# Patient Record
Sex: Female | Born: 1970 | Race: White | Hispanic: No
Health system: Southern US, Community
[De-identification: ages and names within clinical notes are randomized; demographics above are authoritative.]

## PROBLEM LIST (undated history)

## (undated) ENCOUNTER — Emergency Department (HOSPITAL_COMMUNITY): Payer: Medicaid Other | Source: Home / Self Care

## (undated) DIAGNOSIS — I10 Essential (primary) hypertension: Secondary | ICD-10-CM

## (undated) DIAGNOSIS — Z720 Tobacco use: Secondary | ICD-10-CM

## (undated) DIAGNOSIS — I639 Cerebral infarction, unspecified: Secondary | ICD-10-CM

## (undated) DIAGNOSIS — Z90711 Acquired absence of uterus with remaining cervical stump: Secondary | ICD-10-CM

## (undated) DIAGNOSIS — E785 Hyperlipidemia, unspecified: Secondary | ICD-10-CM

## (undated) DIAGNOSIS — K829 Disease of gallbladder, unspecified: Secondary | ICD-10-CM

## (undated) DIAGNOSIS — Z9851 Tubal ligation status: Secondary | ICD-10-CM

## (undated) HISTORY — PX: CHOLECYSTECTOMY: SHX55

## (undated) HISTORY — PX: ABDOMINAL HYSTERECTOMY: SUR658

---

## 1998-06-10 DIAGNOSIS — Z9851 Tubal ligation status: Secondary | ICD-10-CM

## 1998-06-10 DIAGNOSIS — K829 Disease of gallbladder, unspecified: Secondary | ICD-10-CM

## 1998-06-10 HISTORY — DX: Disease of gallbladder, unspecified: K82.9

## 1998-06-10 HISTORY — DX: Tubal ligation status: Z98.51

## 2002-06-10 DIAGNOSIS — Z90711 Acquired absence of uterus with remaining cervical stump: Secondary | ICD-10-CM

## 2002-06-10 HISTORY — DX: Acquired absence of uterus with remaining cervical stump: Z90.711

## 2014-05-10 DIAGNOSIS — I639 Cerebral infarction, unspecified: Secondary | ICD-10-CM

## 2014-05-10 HISTORY — DX: Cerebral infarction, unspecified: I63.9

## 2015-09-01 DIAGNOSIS — F325 Major depressive disorder, single episode, in full remission: Secondary | ICD-10-CM | POA: Insufficient documentation

## 2015-09-01 DIAGNOSIS — I119 Hypertensive heart disease without heart failure: Secondary | ICD-10-CM | POA: Insufficient documentation

## 2015-09-01 DIAGNOSIS — R1312 Dysphagia, oropharyngeal phase: Secondary | ICD-10-CM | POA: Insufficient documentation

## 2015-09-01 DIAGNOSIS — I69359 Hemiplegia and hemiparesis following cerebral infarction affecting unspecified side: Secondary | ICD-10-CM | POA: Insufficient documentation

## 2015-09-01 DIAGNOSIS — K2950 Unspecified chronic gastritis without bleeding: Secondary | ICD-10-CM | POA: Insufficient documentation

## 2015-09-01 DIAGNOSIS — Z79899 Other long term (current) drug therapy: Secondary | ICD-10-CM | POA: Insufficient documentation

## 2018-01-18 ENCOUNTER — Encounter (HOSPITAL_COMMUNITY): Payer: Self-pay | Admitting: *Deleted

## 2018-01-18 ENCOUNTER — Inpatient Hospital Stay (HOSPITAL_COMMUNITY)
Admission: AD | Admit: 2018-01-18 | Discharge: 2018-01-19 | DRG: 069 | Disposition: A | Discharge status: Home / Self Care | Payer: Medicaid Other | Source: Other Acute Inpatient Hospital | Attending: Internal Medicine | Admitting: Internal Medicine

## 2018-01-18 ENCOUNTER — Other Ambulatory Visit: Payer: Self-pay

## 2018-01-18 DIAGNOSIS — I10 Essential (primary) hypertension: Secondary | ICD-10-CM | POA: Diagnosis present

## 2018-01-18 DIAGNOSIS — F17210 Nicotine dependence, cigarettes, uncomplicated: Secondary | ICD-10-CM | POA: Diagnosis present

## 2018-01-18 DIAGNOSIS — R29702 NIHSS score 2: Secondary | ICD-10-CM | POA: Diagnosis present

## 2018-01-18 DIAGNOSIS — I6789 Other cerebrovascular disease: Secondary | ICD-10-CM | POA: Diagnosis not present

## 2018-01-18 DIAGNOSIS — Z801 Family history of malignant neoplasm of trachea, bronchus and lung: Secondary | ICD-10-CM | POA: Diagnosis not present

## 2018-01-18 DIAGNOSIS — G459 Transient cerebral ischemic attack, unspecified: Secondary | ICD-10-CM | POA: Diagnosis present

## 2018-01-18 DIAGNOSIS — I639 Cerebral infarction, unspecified: Secondary | ICD-10-CM | POA: Diagnosis present

## 2018-01-18 DIAGNOSIS — Z90711 Acquired absence of uterus with remaining cervical stump: Secondary | ICD-10-CM | POA: Diagnosis not present

## 2018-01-18 DIAGNOSIS — Z91013 Allergy to seafood: Secondary | ICD-10-CM

## 2018-01-18 DIAGNOSIS — Z716 Tobacco abuse counseling: Secondary | ICD-10-CM | POA: Diagnosis not present

## 2018-01-18 DIAGNOSIS — I69354 Hemiplegia and hemiparesis following cerebral infarction affecting left non-dominant side: Secondary | ICD-10-CM

## 2018-01-18 DIAGNOSIS — I6523 Occlusion and stenosis of bilateral carotid arteries: Secondary | ICD-10-CM | POA: Diagnosis present

## 2018-01-18 DIAGNOSIS — Z9049 Acquired absence of other specified parts of digestive tract: Secondary | ICD-10-CM | POA: Diagnosis not present

## 2018-01-18 DIAGNOSIS — Z720 Tobacco use: Secondary | ICD-10-CM | POA: Diagnosis not present

## 2018-01-18 DIAGNOSIS — I69392 Facial weakness following cerebral infarction: Secondary | ICD-10-CM | POA: Diagnosis not present

## 2018-01-18 DIAGNOSIS — M21372 Foot drop, left foot: Secondary | ICD-10-CM | POA: Diagnosis present

## 2018-01-18 DIAGNOSIS — Z8249 Family history of ischemic heart disease and other diseases of the circulatory system: Secondary | ICD-10-CM | POA: Diagnosis not present

## 2018-01-18 DIAGNOSIS — I6521 Occlusion and stenosis of right carotid artery: Secondary | ICD-10-CM | POA: Diagnosis not present

## 2018-01-18 DIAGNOSIS — Z91041 Radiographic dye allergy status: Secondary | ICD-10-CM | POA: Diagnosis not present

## 2018-01-18 DIAGNOSIS — R55 Syncope and collapse: Secondary | ICD-10-CM | POA: Diagnosis present

## 2018-01-18 DIAGNOSIS — E785 Hyperlipidemia, unspecified: Secondary | ICD-10-CM | POA: Diagnosis present

## 2018-01-18 DIAGNOSIS — Z885 Allergy status to narcotic agent status: Secondary | ICD-10-CM

## 2018-01-18 DIAGNOSIS — I6529 Occlusion and stenosis of unspecified carotid artery: Secondary | ICD-10-CM | POA: Diagnosis not present

## 2018-01-18 HISTORY — DX: Hyperlipidemia, unspecified: E78.5

## 2018-01-18 HISTORY — DX: Essential (primary) hypertension: I10

## 2018-01-18 HISTORY — DX: Tobacco use: Z72.0

## 2018-01-18 HISTORY — DX: Acquired absence of uterus with remaining cervical stump: Z90.711

## 2018-01-18 HISTORY — DX: Tubal ligation status: Z98.51

## 2018-01-18 HISTORY — DX: Disease of gallbladder, unspecified: K82.9

## 2018-01-18 HISTORY — DX: Cerebral infarction, unspecified: I63.9

## 2018-01-18 LAB — PROTIME-INR
INR: 1.03
Prothrombin Time: 13.4 seconds (ref 11.4–15.2)

## 2018-01-18 LAB — APTT: aPTT: 68 seconds — ABNORMAL HIGH (ref 24–36)

## 2018-01-18 MED ORDER — ONDANSETRON HCL 4 MG/2ML IJ SOLN: 4.0000 mg | Freq: Three times a day (TID) | INTRAMUSCULAR | Status: DC | PRN

## 2018-01-18 MED ORDER — SODIUM CHLORIDE 0.9 % IV SOLN
INTRAVENOUS | Status: DC
  Administered 2018-01-18 – 2018-01-19 (×2): via INTRAVENOUS

## 2018-01-18 MED ORDER — METOPROLOL TARTRATE 50 MG PO TABS
50.0000 mg | ORAL_TABLET | Freq: Two times a day (BID) | ORAL | Status: DC
  Administered 2018-01-18: 50 mg via ORAL
  Filled 2018-01-18: qty 1

## 2018-01-18 MED ORDER — HYDRALAZINE HCL 20 MG/ML IJ SOLN: 5.0000 mg | INTRAMUSCULAR | Status: DC | PRN

## 2018-01-18 MED ORDER — OXYCODONE-ACETAMINOPHEN 5-325 MG PO TABS: 1.0000 | ORAL_TABLET | ORAL | Status: DC | PRN

## 2018-01-18 MED ORDER — POLYETHYLENE GLYCOL 3350 17 G PO PACK: 17.0000 g | PACK | Freq: Every day | ORAL | Status: DC | PRN

## 2018-01-18 MED ORDER — ZOLPIDEM TARTRATE 5 MG PO TABS: 5.0000 mg | ORAL_TABLET | Freq: Every evening | ORAL | Status: DC | PRN

## 2018-01-18 MED ORDER — HEPARIN SODIUM (PORCINE) 5000 UNIT/ML IJ SOLN
5000.0000 [IU] | Freq: Three times a day (TID) | INTRAMUSCULAR | Status: DC
  Administered 2018-01-18: 5000 [IU] via SUBCUTANEOUS
  Filled 2018-01-18: qty 1

## 2018-01-18 MED ORDER — ACETAMINOPHEN 650 MG RE SUPP: 650.0000 mg | RECTAL | Status: DC | PRN

## 2018-01-18 MED ORDER — ASPIRIN EC 81 MG PO TBEC
81.0000 mg | DELAYED_RELEASE_TABLET | Freq: Every day | ORAL | Status: DC
  Administered 2018-01-18 – 2018-01-19 (×2): 81 mg via ORAL
  Filled 2018-01-18 (×2): qty 1

## 2018-01-18 MED ORDER — ACETAMINOPHEN 325 MG PO TABS
650.0000 mg | ORAL_TABLET | ORAL | Status: DC | PRN
  Administered 2018-01-19: 650 mg via ORAL
  Filled 2018-01-18: qty 2

## 2018-01-18 MED ORDER — ACETAMINOPHEN 160 MG/5ML PO SOLN: 650.0000 mg | ORAL | Status: DC | PRN

## 2018-01-18 MED ORDER — ATORVASTATIN CALCIUM 80 MG PO TABS
80.0000 mg | ORAL_TABLET | Freq: Every day | ORAL | Status: DC
  Administered 2018-01-18: 80 mg via ORAL
  Filled 2018-01-18: qty 1

## 2018-01-18 MED ORDER — NICOTINE 21 MG/24HR TD PT24
21.0000 mg | MEDICATED_PATCH | Freq: Every day | TRANSDERMAL | Status: DC
  Filled 2018-01-18 (×2): qty 1

## 2018-01-18 MED ORDER — STROKE: EARLY STAGES OF RECOVERY BOOK
Freq: Once | Status: AC
  Administered 2018-01-18

## 2018-01-18 NOTE — Consult Note (Signed)
[facility] [place]  Patient name: Jill Williams MRN: [id number] DOB: 1971-05-01 Sex: female  REASON FOR CONSULT: Evaluation carotid stenosis  HPI: Jill Williams is a 47 y.o. female, who is transferred from [facility] this evening.  I was called this morning by the advanced practice provider caring for the patient at [facility].  He was told that she had a past stroke and admitted with new neurologic deficit with right side weakness.  CT angiogram revealed critical stenosis in her right internal carotid artery and moderate stenosis in the left with irregularity.  The patient was not possible to have an MRI at [facility] on Sunday.  Due to the concern regarding embolus I suggested starting heparin drip and transfer to [facility] for neurologic evaluation and MRI.  There was a significant delay in her being transferred and she is now arrived this evening.  She does have a complex history.  She had a major stroke in her right brain in December 2015.  Apparently was having extreme hypertension.  Was treated in [facility].  Details of this evaluation are not available.  She is right-handed.  She has continued marked neurologic deficit in her left arm.  She does have weakness in her left leg and walks with a cock-up splint.  She reports that she did not have any work-up of carotid disease at that time.  This does seem unlikely.  She reports having a feeding tube which she did not feel that she needed and was transferred to a nursing facility and eventually home.  She reports that she was smoking 3 packs cigarettes per day at the time of her stroke and is dropped back to 1 pack cigarettes per day and is planning on stopping on her daughter's birthday on September 3 of this year.  I explained to her the critical importance of this with her very premature atherosclerotic disease.  She reports that  her father had both right and left carotid endarterectomy for critical stenosis at the [facility] in the past.  He did well with the surgeries.  The patient reports that on this event she did not have any focal weakness.  She reports that she felt dizzy and lightheaded and sick to her stomach.  She did report a burning sensation temporarily in her right temporal region but otherwise no localizing symptoms.  No vision changes.  She reports that she is returned completely back to her baseline deficit.  Past Medical History:  Diagnosis Date  . Disorder of gallbladder 2000   pt states gallbladder was removed  . Essential hypertension   . HLD (hyperlipidemia)   . S/P partial hysterectomy 2004   "whole right side removed"  . Stroke ([place]) 05/10/2014  . Tobacco abuse   . Tubal ligation status 2000   "tied and bunt"    Family History  Problem Relation Age of Onset  . Hypertension Mother   . Lung cancer Father     SOCIAL HISTORY: Social History   Socioeconomic History  . Marital status: Unknown    Spouse name: Not on file  . Number of children: Not on file  . Years of education: Not on file  . Highest education level: Not on file  Occupational History  . Not on file  Social Needs  . Financial resource strain: Not on file  . Food insecurity:    Worry: Not on file    Inability: Not on  file  . Transportation needs:    Medical: Not on file    Non-medical: Not on file  Tobacco Use  . Smoking status: Current Some Day Smoker    Packs/day: 1.00    Years: 34.00    Pack years: 34.00    Types: Cigarettes  . Smokeless tobacco: Never Used  Substance and Sexual Activity  . Alcohol use: Never    Frequency: Never  . Drug use: Never  . Sexual activity: Not Currently  Lifestyle  . Physical activity:    Days per week: Not on file    Minutes per session: Not on file  . Stress: Not on file  Relationships  . Social connections:    Talks on phone: Not on file    Gets together: Not on  file    Attends religious service: Not on file    Active member of club or organization: Not on file    Attends meetings of clubs or organizations: Not on file    Relationship status: Not on file  . Intimate partner violence:    Fear of current or ex partner: Not on file    Emotionally abused: Not on file    Physically abused: Not on file    Forced sexual activity: Not on file  Other Topics Concern  . Not on file  Social History Narrative  . Not on file    Allergies  Allergen Reactions  . Ivp Dye [Iodinated Diagnostic Agents] Shortness Of Breath and Rash  . Codeine Nausea And Vomiting  . Shrimp [Shellfish Allergy] Nausea And Vomiting  . Morphine And Related Rash    Current Facility-Administered Medications  Medication Dose Route Frequency Provider Last Rate Last Dose  .  stroke: mapping our Ilijah Doucet stages of recovery book   Does not apply Once Ivor Costa, MD      . 0.9 %  sodium chloride infusion   Intravenous Continuous Ivor Costa, MD      . acetaminophen (TYLENOL) tablet 650 mg  650 mg Oral Q4H PRN Ivor Costa, MD       Or  . acetaminophen (TYLENOL) solution 650 mg  650 mg Per Tube Q4H PRN Ivor Costa, MD       Or  . acetaminophen (TYLENOL) suppository 650 mg  650 mg Rectal Q4H PRN Ivor Costa, MD      . aspirin EC tablet 81 mg  81 mg Oral Daily Ivor Costa, MD      . atorvastatin (LIPITOR) tablet 80 mg  80 mg Oral q1800 Ivor Costa, MD      . heparin injection 5,000 Units  5,000 Units Subcutaneous Q8H Ivor Costa, MD      . hydrALAZINE (APRESOLINE) injection 5 mg  5 mg Intravenous Q2H PRN Ivor Costa, MD      . metoprolol tartrate (LOPRESSOR) tablet 50 mg  50 mg Oral BID Ivor Costa, MD      . nicotine (NICODERM CQ - dosed in mg/24 hours) patch 21 mg  21 mg Transdermal Daily Ivor Costa, MD      . ondansetron [facility]) injection 4 mg  4 mg Intravenous Q8H PRN Ivor Costa, MD      . oxyCODONE-acetaminophen (PERCOCET/ROXICET) 5-325 MG per tablet 1 tablet  1 tablet Oral Q4H PRN Ivor Costa,  MD      . polyethylene glycol (MIRALAX / GLYCOLAX) packet 17 g  17 g Oral Daily PRN Ivor Costa, MD      . zolpidem (AMBIEN) tablet 5 mg  5  mg Oral QHS PRN Ivor Costa, MD        REVIEW OF SYSTEMS:  [X]  denotes positive finding, [ ]  denotes negative finding Cardiac  Comments:  Chest pain or chest pressure:    Shortness of breath upon exertion: x   Short of breath when lying flat:    Irregular heart rhythm:        Vascular    Pain in calf, thigh, or hip brought on by ambulation:    Pain in feet at night that wakes you up from your sleep:     Blood clot in your veins:    Leg swelling:         Pulmonary    Oxygen at home:    Productive cough:     Wheezing:         Neurologic    Sudden weakness in arms or legs:  x   Sudden numbness in arms or legs:  x   Sudden onset of difficulty speaking or slurred speech:    Temporary loss of vision in one eye:     Problems with dizziness:  x       Gastrointestinal    Blood in stool:     Vomited blood:         Genitourinary    Burning when urinating:     Blood in urine:        Psychiatric    Major depression:         Hematologic    Bleeding problems:    Problems with blood clotting too easily:        Skin    Rashes or ulcers:        Constitutional    Fever or chills:      PHYSICAL EXAM: Vitals:   01/18/18 2016  BP: (!) 159/116  Pulse: 93  Resp: 18  Temp: 98.2 F (36.8 C)  TempSrc: Oral  SpO2: 98%  Weight: 56.5 kg  Height: 5\' 7"  (1.702 m)    GENERAL: The patient is a well-nourished female, in no acute distress. The vital signs are documented above. CARDIOVASCULAR: Harsh carotid bruits bilaterally.  2+ radial pulses bilaterally.  2+ dorsalis pedis pulses bilaterally. PULMONARY: There is good air exchange  ABDOMEN: Soft and non-tender  MUSCULOSKELETAL: There are no major deformities or cyanosis.  Significant muscle wasting in her left hand NEUROLOGIC: Minimal motor function in her left hand.  Decreased ability to  dorsiflex left foot. SKIN: There are no ulcers or rashes noted. PSYCHIATRIC: The patient has a normal affect.  DATA:  CT angiogram from [facility] reveals critical stenosis in the right carotid artery at the bifurcation.  There is moderate stenosis in the left carotid bifurcation and proximal internal carotid artery with possibly some irregularity  MEDICAL ISSUES: The patient currently states that there were no localizing symptoms to suggest left brain event.  Had been placed on heparin drip due to concern for this.  May be able to stop heparin drip depending on the results of the MRI.  If the left carotid is asymptomatic, would continue observation.  Does have critical stenosis in her right carotid and would benefit from endarterectomy assuming no new findings on her MRI.  We will follow with you.  Neurology has been consulted as well.   Rosetta Posner, MD FACS [facility] Tel [phone] Pager [phone]

## 2018-01-18 NOTE — H&P (Addendum)
History and Physical    Jill Williams ZOX:[id number] DOB: 01-25-1971 DOA: 01/18/2018  Referring MD/NP/PA:   PCP: Maris Berger, MD   Patient coming from:  The patient is coming from home.  At baseline, pt is independent for most of ADL.     Chief Complaint: dizziness and weakness  HPI: Jill Williams is a 47 y.o. female with medical history significant of hypertension, hyperlipidemia, stroke 2015 with residual LUE weakness, LUE foot drop and left facial droop, tobacco abuse, who presents with dizziness and weakness.  Pt initially admitted to [facility] because of dizziness and weakness on 01/17/18. Per documentation from [facility], patient was reportedly to have had right-sided weakness and dizziness, but she denies right-sided weakness to me.  She states that she dizziness and severe whole body generalized weakness at 5:00 am 01/17/18.  Her dizziness has resolved currently.  She still feel tired. She has HA. No vision loss or hearing loss.  Patient denies any chest pain, shortness breath, cough, fever or chills.  No nausea, vomiting, diarrhea, abdominal pain, symptoms of UTI. Pt may also have had syncope episode.  CT of the head was unremarkable for acute neurological abnormalities. CTA showed critical 90% stenosis right proximal ICA with 70% stenosis left proximal stenosis with ulcerated fibrofatty plaque or chronic focal dissection. Pending 2D echo result.   Patient was evaluated by tele-neurologist Dr. Deirdre Evener who recommended vascular surgery consultation. On the morning of 8/11 patient's case (including radiographic findings) were discussed with Dr. Donnetta Hutching. He recommended initiation of IV heparin and transfer to [facility] for additional evaluation and treatment. MRI has also been recommended but this study is not available on Sunday. Case was also discussed with neurologist at [facility] Dr. Leonel Ramsay for consultation.  Pt was found to have negative chest  x-ray, INR 1.0, PTT 23.3, negative troponin, proBNP 69, renal function normal, no fever, lipid panel showed TG 126, TC 176, LDL 116, HDL 35. Pt is admitted to telemetry bed as inpatient.  Review of Systems:   General: no fevers, chills, no body weight gain, has fatigue HEENT: no blurry vision, hearing changes or sore throat Respiratory: no dyspnea, coughing, wheezing CV: no chest pain, no palpitations GI: no nausea, vomiting, abdominal pain, diarrhea, constipation GU: no dysuria, burning on urination, increased urinary frequency, hematuria  Ext: no leg edema Neuro: No vision change or hearing loss. Has residual LUE weakness, LUE foot drop and left facial droop and dizziness. Skin: no rash, no skin tear. MSK: No muscle spasm, no deformity, no limitation of range of movement in spin Heme: No easy bruising.  Travel history: No recent long distant travel.  Allergy:  Allergies  Allergen Reactions  . Ivp Dye [Iodinated Diagnostic Agents] Shortness Of Breath and Rash  . Codeine Nausea And Vomiting  . Shrimp [Shellfish Allergy] Nausea And Vomiting  . Morphine And Related Rash    Past Medical History:  Diagnosis Date  . Disorder of gallbladder 2000   pt states gallbladder was removed  . Essential hypertension   . HLD (hyperlipidemia)   . S/P partial hysterectomy 2004   "whole right side removed"  . Stroke ([place]) 05/10/2014  . Tobacco abuse   . Tubal ligation status 2000   "tied and bunt"    Past Surgical History:  Procedure Laterality Date  . ABDOMINAL HYSTERECTOMY    . CHOLECYSTECTOMY      Social History:  reports that she has been smoking cigarettes. She has a 34.00 pack-year smoking  history. She has never used smokeless tobacco. She reports that she does not drink alcohol or use drugs.  Family History:  Family History  Problem Relation Age of Onset  . Hypertension Mother   . Lung cancer Father      Prior to Admission medications   Not on File    Physical  Exam: Vitals:   01/19/18 0112 01/19/18 0300 01/19/18 0459 01/19/18 0500  BP: 128/75 123/72  125/70  Pulse: (!) 59 (!) 59  (!) 59  Resp: 16 18 17 17   Temp: 98.2 F (36.8 C) 98 F (36.7 C)  98 F (36.7 C)  TempSrc: Oral Oral  Oral  SpO2: 98% 98% 100% 100%  Weight:      Height:       General: Not in acute distress HEENT:       Eyes: PERRL, EOMI, no scleral icterus.       ENT: No discharge from the ears and nose, no pharynx injection, no tonsillar enlargement.        Neck: No JVD, no bruit, no mass felt. Heme: No neck lymph node enlargement. Cardiac: S1/S2, RRR, No murmurs, No gallops or rubs. Respiratory: No rales, wheezing, rhonchi or rubs. GI: Soft, nondistended, nontender, no rebound pain, no organomegaly, BS present. GU: No hematuria Ext: No pitting leg edema bilaterally. 2+DP/PT pulse bilaterally. Musculoskeletal: No joint deformities, No joint redness or warmth, no limitation of ROM in spin. Skin: No rashes.  Neuro: Alert, oriented X3, cranial nerves II-XII grossly intact, residual LUE weakness, LUE foot drop and left facial droop. Psych: Patient is not psychotic, no suicidal or hemocidal ideation.  Labs on Admission: I have personally reviewed following labs and imaging studies  CBC: No results for input(s): WBC, NEUTROABS, HGB, HCT, MCV, PLT in the last 168 hours. Basic Metabolic Panel: No results for input(s): NA, K, CL, CO2, GLUCOSE, BUN, CREATININE, CALCIUM, MG, PHOS in the last 168 hours. GFR: CrCl cannot be calculated (No successful lab value found.). Liver Function Tests: No results for input(s): AST, ALT, ALKPHOS, BILITOT, PROT, ALBUMIN in the last 168 hours. No results for input(s): LIPASE, AMYLASE in the last 168 hours. No results for input(s): AMMONIA in the last 168 hours. Coagulation Profile: Recent Labs  Lab 01/18/18 2229  INR 1.03   Cardiac Enzymes: No results for input(s): CKTOTAL, CKMB, CKMBINDEX, TROPONINI in the last 168 hours. BNP (last 3  results) No results for input(s): PROBNP in the last 8760 hours. HbA1C: No results for input(s): HGBA1C in the last 72 hours. CBG: No results for input(s): GLUCAP in the last 168 hours. Lipid Profile: No results for input(s): CHOL, HDL, LDLCALC, TRIG, CHOLHDL, LDLDIRECT in the last 72 hours. Thyroid Function Tests: No results for input(s): TSH, T4TOTAL, FREET4, T3FREE, THYROIDAB in the last 72 hours. Anemia Panel: No results for input(s): VITAMINB12, FOLATE, FERRITIN, TIBC, IRON, RETICCTPCT in the last 72 hours. Urine analysis: No results found for: COLORURINE, APPEARANCEUR, LABSPEC, PHURINE, GLUCOSEU, HGBUR, BILIRUBINUR, KETONESUR, PROTEINUR, UROBILINOGEN, NITRITE, LEUKOCYTESUR Sepsis Labs: @LABRCNTIP (procalcitonin:4,lacticidven:4) )No results found for this or any previous visit (from the past 240 hour(s)).   Radiological Exams on Admission: Mr Brain Wo Contrast  Result Date: 01/19/2018 CLINICAL DATA:  48 y/o F; transient episode of right-sided weakness. EXAM: MRI HEAD WITHOUT CONTRAST TECHNIQUE: Multiplanar, multiecho pulse sequences of the brain and surrounding structures were obtained without intravenous contrast. COMPARISON:  01/17/2018 CT head. 01/18/2018 CT angiogram of head and neck. FINDINGS: Brain: No acute infarction, hemorrhage, hydrocephalus, extra-axial collection or mass  lesion. Chronic hemosiderin stained infarct involving the right insula, lentiform nucleus, caudate head/body, and corona radiata. Wallerian degeneration of the right cerebral peduncle and hemi pons. Vascular: Normal flow voids. Skull and upper cervical spine: Normal marrow signal. Sinuses/Orbits: Negative. Other: None. IMPRESSION: 1. No acute intracranial abnormality identified. 2. Right basal ganglia chronic infarct with wallerian degeneration of right cerebral peduncle and hemi pons. Electronically Signed   By: Kristine Garbe M.D.   On: 01/19/2018 02:53     EKG done in [facility] hospital:  Independently reviewed.  Sinus rhythm, QTC 435, early R wave progression.  No ischemic change.   Assessment/Plan Principal Problem:   TIA (transient ischemic attack) Active Problems:   Essential hypertension   Stroke (HCC)   HLD (hyperlipidemia)   Tobacco abuse   Carotid artery stenosis, asymptomatic, bilateral   Stroke (cerebrum) (HCC)   Syncope   Principal Problem:   TIA (transient ischemic attack) Active Problems:   Essential hypertension   Stroke (HCC)   HLD (hyperlipidemia)   Tobacco abuse   Carotid artery stenosis, asymptomatic, bilateral   Stroke (cerebrum) (HCC)   Syncope  PossibleTIA, syncope and bilateral carotid stenosis: Dr. Lorraine Lax of neurology and Dr. Donnetta Hutching of [facility] were consulted.  Pt was started with IV heparin in before transferred, which will be d/'ced per both Dr. Lorraine Lax and Dr. Donnetta Hutching. Per Dr. Arrie Eastern note, "right carotid does show a small flap which could be ulcerated plaque/old dissection (less likely), however no mobile thrombus seen-->will d/c IV heparin.  - will admit to tele bed as inpt -check UDS - follow up Dr. Luther Parody recommendations - will follow up Aroor's recommendations as follows:  Recommend # MRI of the brain without contrast-->negative for acute stroke. # CTA head:completed #Transthoracic Echo done in [facility], pending results. # Continue ASA daily, considering adding Plavix given her bilateral carotid stenosis-defer to stroke team tomorrow. #Start or continue Atorvastatin 40 mg/other high intensity statin # BP goal: permissive HTN upto 220/120 mmHg  # HBAIC and Lipid profile-->FLP done with LDL 116 # Telemetry monitoring # Frequent neuro checks # stroke swallow screen  Hypertension: Bp 159/116 -hold Bp meds and allow for permissive hypertension -IV hydralazine as needed for SBP.220  HDL -Lipitor  Tobacco abuse: -Nicotine patch    Inpatient status:  # Patient requires inpatient status due to high intensity of service,  high risk for further deterioration and high frequency of surveillance required.  I certify that at the point of admission it is my clinical judgment that the patient will require inpatient hospital care spanning beyond 2 midnights from the point of admission.  . This patient has multiple chronic comorbidities including hypertension, hyperlipidemia, stroke 2015 with residual LUE weakness, LUE foot drop and left facial droop, tobacco abuse,  Now patient has presenting symptoms include dizziness and weakness. . The worrisome physical exam findings include LUE weakness, LUE foot drop and left facial droop. . The initial radiographic and laboratory data are worrisome because CTA showed critical 90% stenosis right proximal ICA with 70% stenosis left proximal stenosis with ulcerated fibrofatty plaque or chronic focal dissection. Pt may need surgery. . Current medical needs: please see my assessment and plan        DVT ppx: SQ Heparin      Code Status: Full code Family Communication: None at bed side.   Disposition Plan:  Anticipate discharge back to previous home environment Consults called:  Dr. Lorraine Lax of neurology and Dr. Donnetta Hutching of [facility] Admission status: Inpatient/tele    Date  of Service 01/19/2018    Ivor Costa [facility] Pager [phone]  If 7PM-7AM, please contact night-coverage [email or web address] Password [facility] 01/19/2018, 6:27 AM

## 2018-01-19 ENCOUNTER — Inpatient Hospital Stay (HOSPITAL_COMMUNITY): Payer: Medicaid Other

## 2018-01-19 ENCOUNTER — Encounter (HOSPITAL_COMMUNITY): Payer: Self-pay | Admitting: Radiology

## 2018-01-19 DIAGNOSIS — I6789 Other cerebrovascular disease: Secondary | ICD-10-CM

## 2018-01-19 DIAGNOSIS — F17210 Nicotine dependence, cigarettes, uncomplicated: Secondary | ICD-10-CM

## 2018-01-19 DIAGNOSIS — G459 Transient cerebral ischemic attack, unspecified: Principal | ICD-10-CM

## 2018-01-19 DIAGNOSIS — R55 Syncope and collapse: Secondary | ICD-10-CM

## 2018-01-19 LAB — RAPID URINE DRUG SCREEN, HOSP PERFORMED
AMPHETAMINES: NOT DETECTED
BARBITURATES: NOT DETECTED
Benzodiazepines: NOT DETECTED
COCAINE: NOT DETECTED
Opiates: NOT DETECTED
TETRAHYDROCANNABINOL: NOT DETECTED

## 2018-01-19 LAB — LIPID PANEL
CHOL/HDL RATIO: 4.4 ratio
Cholesterol: 177 mg/dL (ref 0–200)
HDL: 40 mg/dL — AB (ref >40)
LDL CALC: 114 mg/dL — AB (ref 0–99)
Triglycerides: 116 mg/dL (ref <150)
VLDL: 23 mg/dL (ref 0–40)

## 2018-01-19 LAB — ECHOCARDIOGRAM COMPLETE
Height: 67 in
Weight: 1992.96 oz

## 2018-01-19 LAB — TYPE AND SCREEN
ABO/RH(D): A POS
ANTIBODY SCREEN: NEGATIVE

## 2018-01-19 LAB — HIV ANTIBODY (ROUTINE TESTING W REFLEX): HIV Screen 4th Generation wRfx: NONREACTIVE

## 2018-01-19 MED ORDER — ATORVASTATIN CALCIUM 80 MG PO TABS: 80.0000 mg | ORAL_TABLET | Freq: Every day | ORAL | 0 refills | Status: AC

## 2018-01-19 MED ORDER — ENOXAPARIN SODIUM 40 MG/0.4ML ~~LOC~~ SOLN: 40.0000 mg | SUBCUTANEOUS | Status: DC

## 2018-01-19 MED ORDER — ASPIRIN 81 MG PO TBEC: 81.0000 mg | DELAYED_RELEASE_TABLET | Freq: Every day | ORAL | 0 refills | Status: DC

## 2018-01-19 MED ORDER — HEPARIN SODIUM (PORCINE) 5000 UNIT/ML IJ SOLN
5000.0000 [IU] | Freq: Three times a day (TID) | INTRAMUSCULAR | Status: DC
  Administered 2018-01-19 (×2): 5000 [IU] via SUBCUTANEOUS
  Filled 2018-01-19 (×2): qty 1

## 2018-01-19 NOTE — Evaluation (Signed)
Occupational Therapy Evaluation Patient Details Name: Jill Williams MRN: [id number] DOB: 09/04/1970 Today's Date: 01/19/2018    History of Present Illness Jill Williams is an 47 y.o. 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   Clinical Impression   Pt initially agreeable to OT intervention by answering questions and performing bed mobility. However, pt quickly becoming belligerent to therapist, cursing at therapist, and yelling, " You are just trying to keep me here."OT attempting to provide continued education regarding OT purpose, POC, and goals with pt then refusing to continue. Pt refusing all further services and stating she declines therapy services at this time. Pt reports being at her baseline and having 24/7 supervision/assist at home and with pt declining further services OT signing off. Re-consult if needed in the future. Thank you for referral.    Follow Up Recommendations  No OT follow up;Supervision/Assistance - 24 hour    Equipment Recommendations  None recommended by OT    Recommendations for Other Services Other (comment)(none at this time)     Precautions / Restrictions Precautions Precautions: Fall      Mobility Bed Mobility Overal bed mobility: Needs Assistance    General bed mobility comments: supervision for pt to come to EOB from flat bed  Transfers    General transfer comment: refused    Balance Overall balance assessment: Needs assistance Sitting-balance support: Feet supported;No upper extremity supported Sitting balance-Leahy Scale: Good     ADL either performed or assessed with clinical judgement   ADL    General ADL Comments: pt refused to engage in any tasks and reports she is at baseline. Pt  refusing all OT intervention.     Vision Baseline Vision/History: Wears glasses Wears Glasses: Reading only Patient Visual Report: No change from baseline              Pertinent Vitals/Pain Pain Assessment: No/denies pain     Hand Dominance Right   Extremity/Trunk Assessment Upper Extremity Assessment Upper Extremity Assessment: LUE deficits/detail LUE Deficits / Details: Pt refused to allow therapist to assess. residual weakness from prior CVA   Lower Extremity Assessment Lower Extremity Assessment: Defer to PT evaluation       Communication Communication Communication: No difficulties   Cognition Arousal/Alertness: Awake/alert Behavior During Therapy: Agitated Overall Cognitive Status: Within Functional Limits for tasks assessed                   Home Living Family/patient expects to be discharged to:: Private residence Living Arrangements: Non-relatives/Friends Available Help at Discharge: Friend(s);Available 24 hours/day Type of Home: House Home Access: Stairs to enter [employer] of Steps: 2 Entrance Stairs-Rails: Can reach both;Right;Left Home Layout: One level     Bathroom Shower/Tub: Teacher, early years/pre: Standard     Home Equipment: Environmental consultant - standard   Additional Comments: has a RW but reports not using in several years      Prior Functioning/Environment Level of Independence: Independent with assistive device(s)  Gait / Transfers Assistance Needed: mod I per pt report ADL's / Homemaking Assistance Needed: mod per pt report                     OT Goals(Current goals can be found in the care plan section) Acute Rehab  OT Goals Patient Stated Goal: none stated  OT Frequency:                AM-PAC PT "6 Clicks" Daily Activity     Outcome Measure Help from another person eating meals?: A Little Help from another person taking care of personal grooming?: A Little Help from another person toileting, which  includes using toliet, bedpan, or urinal?: A Little Help from another person bathing (including washing, rinsing, drying)?: A Little Help from another person to put on and taking off regular upper body clothing?: A Little Help from another person to put on and taking off regular lower body clothing?: A Little 6 Click Score: 18   End of Session Nurse Communication: Mobility status;Other (comment)(pt refusal)  Activity Tolerance: Other (comment)(Pt self limiting/refusal) Patient left: in bed;with call bell/phone within reach;with bed alarm set  OT Visit Diagnosis: Muscle weakness (generalized) (M62.81)                Time: 8295-6213 OT Time Calculation (min): 13 min Charges:  OT General Charges $OT Visit: 1 Visit OT Evaluation $OT Eval Low Complexity: 1 Low   Melane Windholz P , MS, OTR/L 01/19/2018, 8:51 AM

## 2018-01-19 NOTE — Discharge Summary (Signed)
Discharge Summary  Jill Williams JHE:[id number] DOB: May 16, 1971  PCP: Maris Berger, MD  Admit date: 01/18/2018 Discharge date: 01/19/2018  Time spent: 20mins  Recommendations for Outpatient Follow-up:  1. F/u with PMD within a week  for hospital discharge follow up, repeat cbc/bmp at follow up 2. F/u with neurology in 4 weeks 3. F/u with vascular surgery Dr Early  Discharge Diagnoses:  Active Hospital Problems   Diagnosis Date Noted  . TIA (transient ischemic attack) 01/19/2018  . Syncope 01/19/2018  . Carotid artery stenosis, asymptomatic, bilateral 01/18/2018  . Stroke (cerebrum) ([place]) 01/18/2018  . Essential hypertension   . HLD (hyperlipidemia)   . Tobacco abuse   . Stroke [facility]) 05/10/2014    Resolved Hospital Problems  No resolved problems to display.    Discharge Condition: stable  Diet recommendation: heart healthy  Filed Weights   01/18/18 2016  Weight: 56.5 kg    History of present illness: (per admitting MD DR Blaine Hamper) PCP: Maris Berger, MD   Patient coming from:  The patient is coming from home.  At baseline, pt is independent for most of ADL.     Chief Complaint: dizziness and weakness  HPI: Jill Williams is a 47 y.o. female with medical history significant of hypertension, hyperlipidemia, stroke 2015 with residual LUE weakness, LUE foot drop and left facial droop, tobacco abuse, who presents with dizziness and weakness.  Pt initially admitted to [facility] because of dizziness and weakness on 01/17/18. Per documentation from [facility], patient was reportedly to have had right-sided weakness and dizziness, but she denies right-sided weakness to me.  She states that she dizziness and severe whole body generalized weakness at 5:00 am 01/17/18.  Her dizziness has resolved currently.  She still feel tired. She has HA. No vision loss or hearing loss.  Patient denies any chest pain, shortness breath, cough, fever or  chills.  No nausea, vomiting, diarrhea, abdominal pain, symptoms of UTI. Pt may also have had syncope episode.  CT of the head was unremarkable for acute neurological abnormalities. CTA to with critical 90% stenosis right proximal ICA with 70% stenosis left proximal stenosis with ulcerated fibrofatty plaque or chronic focal dissection. Pending 2D echo result.   Patient was evaluated by tele-neurologist Dr. Deirdre Evener who recommended vascular surgery consultation. On the morning of 8/11 patient's case (including radiographic findings) were discussed with Dr. Donnetta Hutching. He recommended initiation of IV heparin and transfer to [facility] for additional evaluation and treatment. MRI has also been recommended but this study is not available on Sunday. Case was also discussed with neurologist at [facility] Dr. Leonel Ramsay for consultation.  Pt was found to have negative chest x-ray, INR 1.0, PTT 23.3, negative troponin, proBNP 69, renal function normal, no fever, lipid panel showed TG 126, TC 176, LDL 116, HDL 35. Pt is admitted to telemetry bed as inpatient.  Hospital Course:  Principal Problem:   TIA (transient ischemic attack) Active Problems:   Essential hypertension   Stroke (HCC)   HLD (hyperlipidemia)   Tobacco abuse   Carotid artery stenosis, asymptomatic, bilateral   Stroke (cerebrum) (HCC)   Syncope    Syncope/TIA -CT head, MRI head no acute findings -a1c pending -echo no embolic source, lvef unremarkable -uds pending, she denies illicit drug or alcohol use -Orthostatic vital sign unremarkable Presenting symptom has resolved -Neurology consulted, input appreciated, increase statin from 40mg  to 80mg  daily, continue asa F/u with pcp and neurology  Bilateral carotid stenosis  -Right ICA  90% stenosis, moderate to severe left carotid stenosis -Vascular surgery Dr Donnetta Hutching "Recommend elective right carotid endarterectomy and continued discussion regarding asymptomatic moderate to severe left carotid  stenosis." -avoid hypotension or dehydration -she is to follow up with Dr Early  History of right BG ICH versus hemorrhagic infarct in 2015 Resultant chronic left upper > lower extremity weakness    Hypertension: Bp 159/116 -hold Bp meds and allow for permissive hypertension -resume home bp meds toprol/lisinopril   HDL ldl 114 -Lipitor increase from 40mg  daily to 80mg  daily  Tobacco abuse: -smoking cessation education provided at length > 32mins, she states she is going to quit. She reports she used to smoke 3-4 pack a day, she decreased it down to one pack a day since stroke in 2015, she is now ready to stop smoking completele   Procedures:  none  Consultations:  Neurology dr Rosalin Hawking  Vascular surgery Dr Early   Discharge Exam: BP 118/68 (BP Location: Left Arm)   Pulse (!) 56   Temp 98 F (36.7 C) (Oral)   Resp 16   Ht 5\' 7"  (1.702 m)   Wt 56.5 kg   SpO2 99%   BMI 19.51 kg/m   General: NAD Cardiovascular: RRR Respiratory: CTABL Neuro: chronic left upper > lower extremity weakness, wearing leg brace to left lower extremity   Discharge Instructions You were cared for by a hospitalist during your hospital stay. If you have any questions about your discharge medications or the care you received while you were in the hospital after you are discharged, you can call the unit and asked to speak with the hospitalist on call if the hospitalist that took care of you is not available. Once you are discharged, your primary care physician will handle any further medical issues. Please note that NO REFILLS for any discharge medications will be authorized once you are discharged, as it is imperative that you return to your primary care physician (or establish a relationship with a primary care physician if you do not have one) for your aftercare needs so that they can reassess your need for medications and monitor your lab values.  Discharge Instructions    Ambulatory  referral to Neurology   Complete by:  As directed    An appointment is requested in approximately: 4 weeks   Diet - low sodium heart healthy   Complete by:  As directed    Increase activity slowly   Complete by:  As directed      Allergies as of 01/19/2018      Reactions   Ivp Dye [iodinated Diagnostic Agents] Shortness Of Breath, Rash   Codeine Nausea And Vomiting   Shrimp [shellfish Allergy] Nausea And Vomiting   Influenza Vaccine Live Rash   Morphine And Related Rash   Pneumococcal Vaccines Rash      Medication List    TAKE these medications   aspirin 81 MG EC tablet Take 1 tablet (81 mg total) by mouth daily. Start taking on:  01/20/2018   atorvastatin 80 MG tablet Commonly known as:  LIPITOR Take 1 tablet (80 mg total) by mouth daily at 6 PM. What changed:    medication strength  how much to take  when to take this   famotidine 40 MG tablet Commonly known as:  PEPCID Take 40 mg by mouth daily.   lisinopril 5 MG tablet Commonly known as:  PRINIVIL,ZESTRIL Take 5 mg by mouth daily.   metoprolol succinate 50 MG 24 hr tablet Commonly  known as:  TOPROL-XL Take 25 mg by mouth daily.      Allergies  Allergen Reactions  . Ivp Dye [Iodinated Diagnostic Agents] Shortness Of Breath and Rash  . Codeine Nausea And Vomiting  . Shrimp [Shellfish Allergy] Nausea And Vomiting  . Influenza Vaccine Live Rash  . Morphine And Related Rash  . Pneumococcal Vaccines Rash   Follow-up Information    Early, Arvilla Meres, MD Follow up.   Specialties:  Vascular Surgery, Cardiology Contact information: [address] [place] [address] [phone]        Maris Berger, MD Follow up in 1 week(s).   Specialty:  Family Medicine Why:  hospital discharge follow up please consider stop smoking,  Contact information: [address] [place] [address] [phone]        [facility] Follow up in 4 week(s).   Contact information: [address]     [place] [address] [phone]           The results of significant diagnostics from this hospitalization (including imaging, microbiology, ancillary and laboratory) are listed below for reference.    Significant Diagnostic Studies: Mr Brain 1 Contrast  Result Date: 01/19/2018 CLINICAL DATA:  47 y/o F; transient episode of right-sided weakness. EXAM: MRI HEAD WITHOUT CONTRAST TECHNIQUE: Multiplanar, multiecho pulse sequences of the brain and surrounding structures were obtained without intravenous contrast. COMPARISON:  01/17/2018 CT head. 01/18/2018 CT angiogram of head and neck. FINDINGS: Brain: No acute infarction, hemorrhage, hydrocephalus, extra-axial collection or mass lesion. Chronic hemosiderin stained infarct involving the right insula, lentiform nucleus, caudate head/body, and corona radiata. Wallerian degeneration of the right cerebral peduncle and hemi pons. Vascular: Normal flow voids. Skull and upper cervical spine: Normal marrow signal. Sinuses/Orbits: Negative. Other: None. IMPRESSION: 1. No acute intracranial abnormality identified. 2. Right basal ganglia chronic infarct with wallerian degeneration of right cerebral peduncle and hemi pons. Electronically Signed   By: Kristine Garbe M.D.   On: 01/19/2018 02:53    Microbiology: No results found for this or any previous visit (from the past 240 hour(s)).   Labs: Basic Metabolic Panel: No results for input(s): NA, K, CL, CO2, GLUCOSE, BUN, CREATININE, CALCIUM, MG, PHOS in the last 168 hours. Liver Function Tests: No results for input(s): AST, ALT, ALKPHOS, BILITOT, PROT, ALBUMIN in the last 168 hours. No results for input(s): LIPASE, AMYLASE in the last 168 hours. No results for input(s): AMMONIA in the last 168 hours. CBC: No results for input(s): WBC, NEUTROABS, HGB, HCT, MCV, PLT in the last 168 hours. Cardiac Enzymes: No results for input(s): CKTOTAL, CKMB,  CKMBINDEX, TROPONINI in the last 168 hours. BNP: BNP (last 3 results) No results for input(s): BNP in the last 8760 hours.  ProBNP (last 3 results) No results for input(s): PROBNP in the last 8760 hours.  CBG: No results for input(s): GLUCAP in the last 168 hours.     Signed:  Florencia Reasons MD, PhD  [facility] 01/19/2018, 4:46 PM

## 2018-01-19 NOTE — Progress Notes (Signed)
  Echocardiogram 2D Echocardiogram has been performed.  Jill Williams 01/19/2018, 12:14 PM

## 2018-01-19 NOTE — Care Management Note (Signed)
Case Management Note  Patient Details  Name: Jill Williams MRN: [id number] Date of Birth: 1970-10-01  Subjective/Objective:    Pt in with TIA. She is from home with relatives.  PCP: Dr Selena Batten      Insurance: Medicaid Pt not on medications prior to admission.           Action/Plan: Awaiting PT eval. No f/u per OT. CM following for d/c needs, physician orders.  Expected Discharge Date:                  Expected Discharge Plan:  Home/Self Care  In-House Referral:     Discharge planning Services     Post Acute Care Choice:    Choice offered to:     DME Arranged:    DME Agency:     HH Arranged:    HH Agency:     Status of Service:  In process, will continue to follow  If discussed at Long Length of Stay Meetings, dates discussed:    Additional Comments:  Pollie Friar, RN 01/19/2018, 10:43 AM

## 2018-01-19 NOTE — Consult Note (Signed)
Requesting Physician: Dr.NIU    Chief Complaint: Generalized weakness, sharp pain in the right side of head and syncope  History obtained from: Patient and Chart     HPI:                                                                                                                                       Jill Williams is an 47 y.o. 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  Patient states that the entire day she was feeling weak and lightheaded.  She was also feeling like she was having a headache.  Later in the day she noticed a sharp pain on the right side of her head.  She tried to get up and passed out.  Her meds took her to the hospital.  At outside hospital they obtain a CTA head and neck which showed 90% stenosis of the right carotid as well as 70% stenosis in the left carotid with ulcerated fibrofatty/focal dissection.  Vascular surgery was called and apparently was told she had right-sided symptoms and on CTA there was a concern for embolus and so patient was started on heparin drip.  She was transferred to [facility] to get an MRI and further management.   Date last known well: 8.10.19 tPA Given: no, outside window NIHSS: 2 Baseline MRS 2   Past Medical History:  Diagnosis Date  . Disorder of gallbladder 2000   pt states gallbladder was removed  . Essential hypertension   . HLD (hyperlipidemia)   . S/P partial hysterectomy 2004   "whole right side removed"  . Stroke ([place]) 05/10/2014  . Tobacco abuse   . Tubal ligation status 2000   "tied and bunt"    Past Surgical History:  Procedure Laterality Date  . ABDOMINAL HYSTERECTOMY    . CHOLECYSTECTOMY      Family History  Problem Relation Age of Onset  . Hypertension Mother    . Lung cancer Father    Social History:  reports that she has been smoking cigarettes. She has a 34.00 pack-year smoking history. She has never used smokeless tobacco. She reports that she does not drink alcohol or use drugs.  Allergies:  Allergies  Allergen Reactions  . Ivp Dye [Iodinated Diagnostic Agents] Shortness Of Breath and Rash  . Codeine Nausea And Vomiting  . Shrimp [Shellfish Allergy] Nausea And Vomiting  . Morphine And Related Rash    Medications:  I reviewed home medications. ON ASA   ROS:                                                                                                                                     14 systems reviewed and negative except above    Examination:                                                                                                      General: Appears well-developed and well-nourished.  Psych: Affect appropriate to situation Eyes: No scleral injection HENT: No OP obstrucion Head: Normocephalic.  Cardiovascular: Normal rate and regular rhythm.  Respiratory: Effort normal and breath sounds normal to anterior ascultation GI: Soft.  No distension. There is no tenderness.  Skin: WDI    Neurological Examinatio Mental Status: Alert, oriented, thought content appropriate.  Speech fluent without evidence of aphasia. Able to follow 3 step commands without difficulty. Cranial Nerves: II: Visual fields grossly normal,  III,IV, VI: ptosis not present, extra-ocular motions intact bilaterally, pupils equal, round, reactive to light and accommodation V,VII: smile symmetric, facial light touch sensation normal bilaterally VIII: hearing normal bilaterally IX,X: uvula rises symmetrically XI: bilateral shoulder shrug XII: midline tongue extension Motor: Right : Upper extremity   5/5    Left:     Upper extremity    3/5  Lower extremity   5/5     Lower extremity   4/5 Tone and bulk: Left upper extremity spasticity  Sensory: Pinprick and light touch intact throughout, bilaterally Deep Tendon Reflexes: 3+ reflexes in left biceps and patella, Plantars: Right: downgoing   Left: downgoing Cerebellar: normal finger-to-nose on right side.  Gait: did not assess      Lab Results: Basic Metabolic Panel: No results for input(s): NA, K, CL, CO2, GLUCOSE, BUN, CREATININE, CALCIUM, MG, PHOS in the last 168 hours.  CBC: No results for input(s): WBC, NEUTROABS, HGB, HCT, MCV, PLT in the last 168 hours.  Coagulation Studies: Recent Labs    01/18/18 Sep 19, 2227  LABPROT 13.4  INR 1.03    Imaging: No results found.   ASSESSMENT AND PLAN  Jill Williams is an 47 y.o. 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  Was described as right-sided weakness.  Initially thought to be TIA due to symptomatic carotid stenosis and transferred to [facility] for stat MRI brain and Vascular surgery consult.  I reviewed the images and right carotid does show a small flap which could be ulcerated plaque/old dissection (less likely) -  however no mobile thrombus seen.  In the absence of localizing weakness or that she is not having any fluctuating symptoms I do not think we need to continue heparin drip thats started at [facility].  Dr. Donnetta Hutching from vascular surgery also felt the same and recommended to follow-up MRI brain.  MRI brain shows no acute stroke and therefore we will not order heparin drip.  Will complete rest of the stroke work-up  Impression: Syncope vs Migraine vs Transient ischemic attack   Recommend # MRI of the  brain without contrast:completed # CTA head:completed #Transthoracic Echo  # Continue ASA daily, considering adding Plavix given her bilateral carotid stenosis-defer to stroke team tomorrow. #Start or continue Atorvastatin 40 mg/other high intensity statin # BP goal: permissive HTN upto 220/120 mmHg  # HBAIC and Lipid profile # Telemetry monitoring # Frequent neuro checks # stroke swallow screen  Please page stroke NP  Or  PA  Or MD from 8am -4 pm  as this patient from this time will be  followed by the stroke.   You can look them up on [email or web address]  Password [facility]    Sushanth Aroor [facility] Pager Number [phone]

## 2018-01-19 NOTE — Progress Notes (Signed)
Nurse went over discharge with patient. Patient verbalized understanding of discharge. All questions and concerns addressed. Discharging home with all belongings taking down in a wheelchair.

## 2018-01-19 NOTE — Progress Notes (Signed)
STROKE TEAM PROGRESS NOTE   SUBJECTIVE (INTERVAL HISTORY) No family is at the bedside.  Patient stated that she did not have good sleep last night, and she felt headache this morning but mild.  She had recounted HPI with me.  She said on 01/17/18, she woke up did not feel well, generalized weakness and feeling sick to her stomach.  Over the day, sick feeling of stomach resolved but still had generalized weakness.  She did not go anywhere she stayed at home watching TV.  At 8 PM, she bent down to pick her puppy up, on standing up, she felt lightheadedness and passed out on [place] backwards.  She woke up briefly after, however felt heat flash feeling on the right head which resembles her previous stroke in 2015.  EMS called, she walked to the ambulance with a driver at that time.  In the ED, CT head neck showed 90% right ICA stenosis, she was put on heparin drip.  She was evaluated by Dr. Donnetta Hutching and considered asymptomatic right ICA stenosis, will do outpatient follow-up.  Initial report showing 70% left ICA stenosis, however I read with Dr. Nevada Crane in radiology this a.m. and did not appear left ICA high-grade stenosis.    OBJECTIVE Vitals:   01/19/18 0459 01/19/18 0500 01/19/18 0654 01/19/18 0900  BP:  125/70 101/63 117/77  Pulse:  (!) 59 61 65  Resp: 17 17 18 16   Temp:  98 F (36.7 C) 98 F (36.7 C) (!) 97.5 F (36.4 C)  TempSrc:  Oral Oral Oral  SpO2: 100% 100% 100% 100%  Weight:      Height:        CBC: No results for input(s): WBC, NEUTROABS, HGB, HCT, MCV, PLT in the last 168 hours.  Basic Metabolic Panel: No results for input(s): NA, K, CL, CO2, GLUCOSE, BUN, CREATININE, CALCIUM, MG, PHOS in the last 168 hours.  Lipid Panel:     Component Value Date/Time   CHOL 177 01/19/2018 0722   TRIG 116 01/19/2018 0722   HDL 40 (L) 01/19/2018 0722   CHOLHDL 4.4 01/19/2018 0722   VLDL 23 01/19/2018 0722   LDLCALC 114 (H) 01/19/2018 0722   HgbA1c: No results found for: HGBA1C Urine Drug  Screen: No results found for: LABOPIA, COCAINSCRNUR, LABBENZ, AMPHETMU, THCU, LABBARB  Alcohol Level No results found for: [place] I have personally reviewed the radiological images below and agree with the radiology interpretations.  CTA head and neck Right ICA 90% stenosis, string sign.  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 Nevada Crane.  Diffuse atherosclerosis intracranially, but no significant stenosis.  Mr Brain Wo Contrast  01/19/2018 IMPRESSION:  1. No acute intracranial abnormality identified. 2. Right basal ganglia chronic infarct with wallerian degeneration of right cerebral peduncle and hemi pons.  Read with Dr. Nevada Crane this morning, it appears to be chronic right basal ganglia ICH versus hemorrhagic infarct  Transthoracic Echocardiogram - pending   PHYSICAL EXAM  Temp:  [97.5 F (36.4 C)-98.2 F (36.8 C)] 97.5 F (36.4 C) (08/12 0900) Pulse Rate:  [59-93] 65 (08/12 0900) Resp:  [16-18] 16 (08/12 0900) BP: (101-159)/(41-116) 117/77 (08/12 0900) SpO2:  [97 %-100 %] 100 % (08/12 0900) Weight:  [56.5 kg] 56.5 kg (08/11 2016)  General - Well nourished, well developed, in no apparent distress.  Ophthalmologic - fundi not visualized due to noncooperation.  Cardiovascular - Regular rate and rhythm.  Mental Status -  Level of arousal and orientation to time, place, and person were intact. Language including expression, naming, repetition, comprehension was assessed and found intact. Fund of Knowledge was assessed and was intact.  Cranial Nerves II - XII - II - Visual field intact OU. III, IV, VI - Extraocular movements intact. V - Facial sensation intact bilaterally. VII - mild left facial droop. VIII - Hearing & vestibular intact bilaterally. X - Palate elevates symmetrically XI - Chin turning & shoulder shrug intact bilaterally. XII - Tongue protrusion intact.  Motor Strength -  The patient's strength was normal in right UE and LE extremities, however, left upper extremity deltoid 2/5, biceps 3/5, triceps 2/5 in the finger grip 4/5. Left LE proximal 5/5, distal 4/5. Bulk was normal and fasciculations were absent.   Motor Tone - Muscle tone was assessed at the neck and appendages and was normal.  Reflexes - The patient's reflexes were symmetrical in all extremities and she had left pathological reflexes.  Sensory - Light touch, temperature/pinprick were assessed and were symmetrical.    Coordination - The patient had normal movements in the right hand with no ataxia or dysmetria.  Tremor was absent.  Gait and Station - deferred.    ASSESSMENT/PLAN Ms. Jill Williams is a 47 y.o. female with history of tobacco use, previous stroke, hyperlipidemia, chronic HA, carotid artery disease and hypertension presenting with sharp right-sided headache and syncope. She did not receive IV t-PA due to late presentation.  Syncope  Likely orthostatic on standing up prior to the episode  Now back to baseline  TTE pending  Consider to check orthostatic vitals  Management as per primary team  History of right BG ICH versus hemorrhagic infarct  Resultant chronic left upper > lower extremity weakness  CT head -no acute abnormality  MRI head - no acute infarct, right BG previous ICH vs. hemorrhagic infarct  Carotid Doppler - CTA head and neck at [facility]  CTA H&N - 90% Rt ICA stenosis with string sign. Left ICA with ulcerated fibrofatty vs. chronic focal dissection.   UDS - pending  2D Echo - pending  LDL - 114  HgbA1c - pending  VTE prophylaxis - [place] Heparin  Diet - heart healthy with thin liquids  aspirin 81 mg daily prior to admission, now on aspirin 81 mg daily.  Continue aspirin on discharge  Patient counseled to be compliant with her antithrombotic medications  Ongoing aggressive stroke risk factor management  Therapy  recommendations:  pending  Disposition:  Pending  Right ICA stenosis  Shown on CTA neck with 90% stenosis and string sign  Dr. early on board  Consider outpatient right CEA  Follow-up with Dr. early as outpatient  No heparin IV indicated at this time  Hypertension  Stable on the low side . Consider orthostatic vital . Avoid low BP . Long-term BP goal normotensive  Hyperlipidemia  Lipid lowering medication PTA: Lipitor 80 mg daily  LDL 114, goal < 70  Current lipid lowering medication: Lipitor 80 mg daily  Continue statin at discharge  Tobacco abuse  Current smoker  Smoking cessation counseling provided  Nicotine patch provided  Pt is willing to quit  Other Stroke Risk Factors    Other [facility] day # 1  Neurology will sign off. Please call with questions.  No neuro follow-up needed at this time. Thanks for the consult.   Rosalin Hawking, MD PhD Stroke Neurology 01/19/2018 2:27 PM    To contact  Stroke Continuity provider, please refer to [email or web address] After hours, contact General Neurology

## 2018-01-19 NOTE — Progress Notes (Signed)
Comfortable this morning.  Eating breakfast  MRI results noted.  Old right brain stroke.  No new stroke.  Dr Aroor's evaluation noted.  He agreed there was no indication for heparin or urgent intervention.  CTA shows critical stenosis in right carotid and moderate to severe left carotid stenosis.  Recommend elective right carotid endarterectomy and continued discussion regarding asymptomatic moderate to severe left carotid stenosis.  Completion work-up underway for possible new cause of syncopal episode.  Okay for discharge from vascular standpoint with plan for readmission for right carotid endarterectomy.  Discussed at length with the patient

## 2018-01-19 NOTE — Evaluation (Signed)
Physical Therapy Evaluation Patient Details Name: Jill Williams MRN: [id number] DOB: 09/20/1970 Today's Date: 01/19/2018   History of Present Illness  Corynne Scibilia is an 47 y.o. 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 after developing sharp in the right side of her head and then passing out.  Clinical Impression  Orders received for PT evaluation. Patient demonstrates deficits in functional mobility but reports this as her baseline functional status. Will benefit from continued skilled PT but patient declining further therapy services at this time. States she will have adequate assist at home and that she is managing just fine. OF note, patient was able to ambulate in the room without physical assist despite deficits. Will defer further PT follow up at this time. No further acute needs, will sign off.     Follow Up Recommendations No PT follow up    Equipment Recommendations  None recommended by PT    Recommendations for Other Services       Precautions / Restrictions Precautions Precautions: Fall      Mobility  Bed Mobility Overal bed mobility: Needs Assistance             General bed mobility comments: supervision for pt to come to EOB from flat bed  Transfers Overall transfer level: Modified independent Equipment used: None             General transfer comment: refused  Ambulation/Gait Ambulation/Gait assistance: Supervision Gait Distance (Feet): 26 Feet Assistive device: None Gait Pattern/deviations: Decreased step length - left;Decreased dorsiflexion - left;Trunk flexed;Trendelenburg Gait velocity: decreased   General Gait Details: noted gait deviation with increased sway and LLE compensations (AFO on) circumduction noted. No physical assist required  Stairs             Wheelchair Mobility    Modified Rankin (Stroke Patients Only) Modified Rankin (Stroke Patients Only) Pre-Morbid Rankin Score: Moderate disability Modified Rankin: Moderate disability     Balance Overall balance assessment: Needs assistance Sitting-balance support: Feet supported;No upper extremity supported Sitting balance-Leahy Scale: Good     Standing balance support: During functional activity Standing balance-Leahy Scale: Fair Standing balance comment: no physical assist required                             Pertinent Vitals/Pain Pain Assessment: No/denies pain    Home Living Family/patient expects to be discharged to:: Private residence Living Arrangements: Non-relatives/Friends Available Help at Discharge: Friend(s);Available 24 hours/day Type of Home: House Home Access: Stairs to enter Entrance Stairs-Rails: Can reach both;Right;Left Entrance Stairs-Number of Steps: 2 Home Layout: One level Home Equipment: Walker - standard Additional Comments: has a RW but reports not using in several years    Prior Function Level of Independence: Independent with assistive device(s)   Gait / Transfers Assistance Needed: mod I per pt report  ADL's / Homemaking Assistance Needed: mod per pt report        Hand Dominance   Dominant Hand: Right    Extremity/Trunk Assessment   Upper Extremity Assessment Upper Extremity Assessment: Defer to OT evaluation    Lower Extremity Assessment Lower Extremity Assessment: LLE deficits/detail LLE Deficits / Details: history of LLE deficits, wears AFO LLE Sensation: decreased light touch;decreased proprioception LLE Coordination: decreased gross motor;decreased fine motor       Communication   Communication: No  difficulties  Cognition Arousal/Alertness: Awake/alert Behavior During Therapy: WFL for tasks assessed/performed Overall Cognitive Status: No family/caregiver present to determine baseline cognitive  functioning                                        General Comments      Exercises     Assessment/Plan    PT Assessment [place] does not need any further PT services(patient refusing further needs, reports mobility baseline)  PT Problem List         PT Treatment Interventions      PT Goals (Current goals can be found in the Care Plan section)  Acute Rehab PT Goals Patient Stated Goal: none stated PT Goal Formulation: All assessment and education complete, DC therapy    Frequency     Barriers to discharge        Co-evaluation               AM-PAC PT "6 Clicks" Daily Activity  Outcome Measure Difficulty turning over in bed (including adjusting bedclothes, sheets and blankets)?: None Difficulty moving from lying on back to sitting on the side of the bed? : None Difficulty sitting down on and standing up from a chair with arms (e.g., wheelchair, bedside commode, etc,.)?: A Little Help needed moving to and from a bed to chair (including a wheelchair)?: A Little Help needed walking in hospital room?: A Little Help needed climbing 3-5 steps with a railing? : A Little 6 Click Score: 20    End of Session   Activity Tolerance: Patient tolerated treatment well;Patient limited by fatigue Patient left: in bed;with call bell/phone within reach;with bed alarm set Nurse Communication: Mobility status      Time:  -      Charges:              Alben Deeds, PT DPT  Board Certified Neurologic Specialist [phone]   Duncan Dull 01/19/2018, 1:06 PM

## 2018-01-20 LAB — ABO/RH: ABO/RH(D): A POS

## 2018-01-20 LAB — HEMOGLOBIN A1C
Hgb A1c MFr Bld: 5.4 % (ref 4.8–5.6)
Mean Plasma Glucose: 108 mg/dL

## 2018-01-27 ENCOUNTER — Telehealth: Payer: Self-pay | Admitting: *Deleted

## 2018-01-27 NOTE — Telephone Encounter (Signed)
Notified Dr. Donnetta Hutching that I have now left 2 messages for patient to call back to scheduled CEA.

## 2018-02-04 ENCOUNTER — Observation Stay (HOSPITAL_COMMUNITY)
Admission: AD | Admit: 2018-02-04 | Discharge: 2018-02-04 | Disposition: A | Discharge status: Home / Self Care | Payer: Medicaid Other | Source: Other Acute Inpatient Hospital | Attending: Internal Medicine | Admitting: Internal Medicine

## 2018-02-04 DIAGNOSIS — Z91041 Radiographic dye allergy status: Secondary | ICD-10-CM | POA: Insufficient documentation

## 2018-02-04 DIAGNOSIS — Z91013 Allergy to seafood: Secondary | ICD-10-CM | POA: Diagnosis not present

## 2018-02-04 DIAGNOSIS — G9340 Encephalopathy, unspecified: Secondary | ICD-10-CM | POA: Diagnosis present

## 2018-02-04 DIAGNOSIS — I10 Essential (primary) hypertension: Secondary | ICD-10-CM | POA: Insufficient documentation

## 2018-02-04 DIAGNOSIS — I6523 Occlusion and stenosis of bilateral carotid arteries: Secondary | ICD-10-CM | POA: Insufficient documentation

## 2018-02-04 DIAGNOSIS — K920 Hematemesis: Principal | ICD-10-CM | POA: Insufficient documentation

## 2018-02-04 DIAGNOSIS — I6930 Unspecified sequelae of cerebral infarction: Secondary | ICD-10-CM

## 2018-02-04 DIAGNOSIS — Z7982 Long term (current) use of aspirin: Secondary | ICD-10-CM | POA: Insufficient documentation

## 2018-02-04 DIAGNOSIS — Z887 Allergy status to serum and vaccine status: Secondary | ICD-10-CM | POA: Diagnosis not present

## 2018-02-04 DIAGNOSIS — Z885 Allergy status to narcotic agent status: Secondary | ICD-10-CM | POA: Insufficient documentation

## 2018-02-04 DIAGNOSIS — Z720 Tobacco use: Secondary | ICD-10-CM | POA: Diagnosis not present

## 2018-02-04 DIAGNOSIS — Z716 Tobacco abuse counseling: Secondary | ICD-10-CM

## 2018-02-04 DIAGNOSIS — I69354 Hemiplegia and hemiparesis following cerebral infarction affecting left non-dominant side: Secondary | ICD-10-CM | POA: Diagnosis not present

## 2018-02-04 DIAGNOSIS — E785 Hyperlipidemia, unspecified: Secondary | ICD-10-CM | POA: Diagnosis not present

## 2018-02-04 DIAGNOSIS — K922 Gastrointestinal hemorrhage, unspecified: Secondary | ICD-10-CM | POA: Diagnosis present

## 2018-02-04 DIAGNOSIS — Z79899 Other long term (current) drug therapy: Secondary | ICD-10-CM | POA: Diagnosis not present

## 2018-02-04 LAB — BASIC METABOLIC PANEL
ANION GAP: 10 (ref 5–15)
BUN: 9 mg/dL (ref 6–20)
CO2: 22 mmol/L (ref 22–32)
Calcium: 9 mg/dL (ref 8.9–10.3)
Chloride: 110 mmol/L (ref 98–111)
Creatinine, Ser: 0.66 mg/dL (ref 0.44–1.00)
GFR calc Af Amer: >60 mL/min (ref >60)
GFR calc non Af Amer: >60 mL/min (ref >60)
GLUCOSE: 77 mg/dL (ref 70–99)
POTASSIUM: 3.6 mmol/L (ref 3.5–5.1)
Sodium: 142 mmol/L (ref 135–145)

## 2018-02-04 LAB — CBC WITH DIFFERENTIAL/PLATELET
ABS IMMATURE GRANULOCYTES: 0 10*3/uL (ref 0.0–0.1)
Basophils Absolute: 0.1 10*3/uL (ref 0.0–0.1)
Basophils Relative: 1 %
Eosinophils Absolute: 0.2 10*3/uL (ref 0.0–0.7)
Eosinophils Relative: 3 %
HCT: 39.3 % (ref 36.0–46.0)
HEMOGLOBIN: 12.6 g/dL (ref 12.0–15.0)
IMMATURE GRANULOCYTES: 0 %
LYMPHS ABS: 2.2 10*3/uL (ref 0.7–4.0)
Lymphocytes Relative: 43 %
MCH: 28.9 pg (ref 26.0–34.0)
MCHC: 32.1 g/dL (ref 30.0–36.0)
MCV: 90.1 fL (ref 78.0–100.0)
MONOS PCT: 8 %
Monocytes Absolute: 0.4 10*3/uL (ref 0.1–1.0)
NEUTROS PCT: 45 %
Neutro Abs: 2.3 10*3/uL (ref 1.7–7.7)
Platelets: 240 10*3/uL (ref 150–400)
RBC: 4.36 MIL/uL (ref 3.87–5.11)
RDW: 14.1 % (ref 11.5–15.5)
WBC: 5.1 10*3/uL (ref 4.0–10.5)

## 2018-02-04 LAB — TYPE AND SCREEN
ABO/RH(D): A POS
Antibody Screen: NEGATIVE

## 2018-02-04 MED ORDER — NICOTINE 7 MG/24HR TD PT24: 7.0000 mg | MEDICATED_PATCH | Freq: Every day | TRANSDERMAL | 0 refills | Status: DC

## 2018-02-04 MED ORDER — ONDANSETRON HCL 4 MG/2ML IJ SOLN: 4.0000 mg | Freq: Four times a day (QID) | INTRAMUSCULAR | Status: DC | PRN

## 2018-02-04 MED ORDER — METOPROLOL SUCCINATE ER 25 MG PO TB24
25.0000 mg | ORAL_TABLET | Freq: Every day | ORAL | Status: DC
  Administered 2018-02-04: 25 mg via ORAL
  Filled 2018-02-04: qty 1

## 2018-02-04 MED ORDER — FAMOTIDINE 20 MG PO TABS: 40.0000 mg | ORAL_TABLET | Freq: Every day | ORAL | Status: DC

## 2018-02-04 MED ORDER — LISINOPRIL 5 MG PO TABS
5.0000 mg | ORAL_TABLET | Freq: Every day | ORAL | Status: DC
  Administered 2018-02-04: 5 mg via ORAL
  Filled 2018-02-04: qty 1

## 2018-02-04 MED ORDER — ACETAMINOPHEN 650 MG RE SUPP: 650.0000 mg | Freq: Four times a day (QID) | RECTAL | Status: DC | PRN

## 2018-02-04 MED ORDER — NICOTINE 21 MG/24HR TD PT24
21.0000 mg | MEDICATED_PATCH | Freq: Every day | TRANSDERMAL | Status: DC
  Filled 2018-02-04: qty 1

## 2018-02-04 MED ORDER — ACETAMINOPHEN 325 MG PO TABS: 650.0000 mg | ORAL_TABLET | Freq: Four times a day (QID) | ORAL | Status: DC | PRN

## 2018-02-04 MED ORDER — SODIUM CHLORIDE 0.9% IV SOLUTION: Freq: Once | INTRAVENOUS | Status: DC

## 2018-02-04 MED ORDER — ONDANSETRON HCL 4 MG PO TABS: 4.0000 mg | ORAL_TABLET | Freq: Four times a day (QID) | ORAL | Status: DC | PRN

## 2018-02-04 MED ORDER — ATORVASTATIN CALCIUM 80 MG PO TABS: 80.0000 mg | ORAL_TABLET | Freq: Every day | ORAL | Status: DC

## 2018-02-04 MED ORDER — SODIUM CHLORIDE 0.9 % IV SOLN: INTRAVENOUS | Status: DC

## 2018-02-04 MED ORDER — PANTOPRAZOLE SODIUM 40 MG PO TBEC: 40.0000 mg | DELAYED_RELEASE_TABLET | Freq: Every day | ORAL | Status: DC

## 2018-02-04 MED ORDER — PANTOPRAZOLE SODIUM 40 MG PO TBEC: 40.0000 mg | DELAYED_RELEASE_TABLET | Freq: Every day | ORAL | 0 refills | Status: DC

## 2018-02-04 NOTE — Discharge Instructions (Signed)
Hematemesis °Hematemesis is when you vomit blood. It is a sign of bleeding in the upper part of your digestive tract. This is also called your gastrointestinal (GI) tract. Your upper GI tract includes your mouth, throat, esophagus, stomach, and the first part of your small intestine (duodenum). °Hematemesis is usually caused by bleeding from your esophagus or stomach. You may suddenly vomit bright red blood. You might also vomit old blood. It may look like coffee grounds. You may also have other symptoms, such as: °· Stomach pain. °· Heartburn. °· Black and tarry stool. ° °Follow these instructions at home: °Watch your hematemesis for any changes. The following actions may help to lessen any discomfort you are feeling: °· Take medicines only as directed by your health care provider. Do not take aspirin, ibuprofen, or any other anti-inflammatory medicine without approval from your health care provider. °· Rest as needed. °· Drink small sips of clear liquids often, as long as you can keep them down. Try to drink enough fluids to keep your urine clear or pale yellow. °· Do not drink alcohol. °· Do not use any tobacco products, including cigarettes, chewing tobacco, or electronic cigarettes. If you need help quitting, ask your health care provider. °· Keep all follow-up visits as directed by your health care provider. This is important. ° °Contact a health care provider if: °· The vomiting of blood worsens, or begins again after it has stopped. °· You have persistent stomach pain. °· You have nausea, indigestion, or heartburn. °· You feel weak or dizzy. °Get help right away if: °· You faint or feel extremely weak. °· You have a rapid heartbeat. °· You are urinating less than normal or not at all. °· You have persistent vomiting. °· You vomit large amounts of bloody or dark material. °· You vomit bright red blood. °· You pass large, dark, or bloody stools. °· You have chest pain or trouble breathing. °This information is  not intended to replace advice given to you by your health care provider. Make sure you discuss any questions you have with your health care provider. °Document Released: 07/04/2004 Document Revised: 11/02/2015 Document Reviewed: 01/19/2014 °Elsevier Interactive Patient Education © 2018 [employer] ° °

## 2018-02-04 NOTE — Consult Note (Addendum)
[facility] Consult: 8:25 AM 02/04/2018  LOS: 0 days    Referring Provider: Dr Clementeen Graham  Primary Care Physician:  Maris Berger, MD in [place] Primary Gastroenterologist:  unassigned    Reason for Consultation:  Hematemesis   HPI: Jill Williams is a 47 y.o. female.  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  TIA 8/11.  Discharged briefly to [facility] 8/12. Bil carotid stenosis.  Dr Early planning right CEA.  On 81 mg ASA, no Plavix etc. S/p PEG for 3 months after CVA. S/p chole, hysterectomy, and BTL.  EGD ~ 30 yrs ago for ? Of PUD, study did not confirm ulcers.   Smoker.   Takes Pepcid Rxd by MD but she tells MD she does not need this as she has no reflux sxs, no dysphagia, no indigestion.    Ate dinner ~ 6:45.  felt fine.  ~ 9 PM sudden nausea and vomited x 1, emesis contained small clot of blood, otherwise partially digested food.  For 95 m after this, could not recall her name or address.  EMS arrived and took her to [facility].  From there transferred to [facility].  No recurrent n/v, no BM's, memory lapse resolved.   Daly reddish brown BM's, BID.  FOBT negative at SNF.  Has not seen frank blood or melena.  Good appetite.  No NSAIDs, no ETOH.   She is hungry this AM.   Describes significant weight loss 2015 after CVA and PEG.  Previously weighed ~ 260#/117 kg, now 56.5 kg  Hgb 13.3 yesterday >>12.6 this AM.  MCV normal.   BUN not elevated.  Coags, platelets normal.    Fm hx of vascular dz: dad s/p bil CEA.  + lung cancer in dad.  No PUD, anemia, GIB, colon polyps or cancers.    Past Medical History:  Diagnosis Date  . Disorder of gallbladder 2000   pt states gallbladder was removed  . Essential hypertension   . HLD (hyperlipidemia)   . S/P partial hysterectomy 2004   "whole right  side removed"  . Stroke ([place]) 05/10/2014  . Tobacco abuse   . Tubal ligation status 2000   "tied and bunt"    Past Surgical History:  Procedure Laterality Date  . ABDOMINAL HYSTERECTOMY    . CHOLECYSTECTOMY      Prior to Admission medications   Medication Sig Start Date End Date Taking? Authorizing Provider  aspirin EC 81 MG EC tablet Take 1 tablet (81 mg total) by mouth daily. 01/20/18   Florencia Reasons, MD  atorvastatin (LIPITOR) 80 MG tablet Take 1 tablet (80 mg total) by mouth daily at 6 PM. 01/19/18   Florencia Reasons, MD  famotidine (PEPCID) 40 MG tablet Take 40 mg by mouth daily. 11/14/17   [provider]  lisinopril (PRINIVIL,ZESTRIL) 5 MG tablet Take 5 mg by mouth daily. 11/14/17   [provider]  metoprolol succinate (TOPROL-XL) 50 MG 24 hr tablet Take 25 mg by mouth daily. 11/14/17   [provider]    Scheduled Meds: .  sodium chloride   Intravenous Once  . atorvastatin  80 mg Oral q1800  . famotidine  40 mg Oral Daily  . lisinopril  5 mg Oral Daily  . metoprolol succinate  25 mg Oral Daily  . nicotine  21 mg Transdermal Daily   Infusions: . sodium chloride     PRN Meds: acetaminophen **OR** acetaminophen, ondansetron **OR** ondansetron (ZOFRAN) IV   Allergies as of 02/04/2018 - Review Complete 01/19/2018  Allergen Reaction Noted  . Ivp dye [iodinated diagnostic agents] Shortness Of Breath and Rash 01/18/2018  . Codeine Nausea And Vomiting 01/18/2018  . Shrimp [shellfish allergy] Nausea And Vomiting 01/18/2018  . Influenza vaccine live Rash 01/19/2018  . Morphine and related Rash 01/18/2018  . Pneumococcal vaccines Rash 01/19/2018    Family History  Problem Relation Age of Onset  . Hypertension Mother   . Lung cancer Father     Social History   Socioeconomic History  . Marital status: Unknown    Spouse name: Not on file  . Number of children: Not on file  . Years of education: Not on file  . Highest education level: Not on file    Occupational History  . Not on file  Social Needs  . Financial resource strain: Not on file  . Food insecurity:    Worry: Not on file    Inability: Not on file  . Transportation needs:    Medical: Not on file    Non-medical: Not on file  Tobacco Use  . Smoking status: Current Some Day Smoker    Packs/day: 1.00    Years: 34.00    Pack years: 34.00    Types: Cigarettes  . Smokeless tobacco: Never Used  Substance and Sexual Activity  . Alcohol use: Never    Frequency: Never  . Drug use: Never  . Sexual activity: Not Currently  Lifestyle  . Physical activity:    Days per week: Not on file    Minutes per session: Not on file  . Stress: Not on file  Relationships  . Social connections:    Talks on phone: Not on file    Gets together: Not on file    Attends religious service: Not on file    Active member of club or organization: Not on file    Attends meetings of clubs or organizations: Not on file    Relationship status: Not on file  . Intimate partner violence:    Fear of current or ex partner: Not on file    Emotionally abused: Not on file    Physically abused: Not on file    Forced sexual activity: Not on file  Other Topics Concern  . Not on file  Social History Narrative  . Not on file    REVIEW OF SYSTEMS: Constitutional: No weakness, no dizziness. ENT:  No nose bleeds Pulm: No cough.  Patient is able to climb 10 stairs at a time easily, she does this all the time at home. CV:  No palpitations, no LE edema.  GU:  No hematuria, no frequency GI:   Per HPI.  Has been tested for hepatitis B and C and these were negative. Heme: Denies excessive bleeding or bruising.  Has never been prescribed iron, vitamin B12, folate. Transfusions: None ever. Neuro:  No headaches, no peripheral tingling or numbness.  Brief memory lapse yesterday evening not associated with any new physical/neurologic defects. Derm:  No itching, no rash or sores.  Endocrine:  No sweats or chills.  No polyuria or dysuria Immunization: Not required. Travel:  None beyond local counties in last few months.    PHYSICAL EXAM: Vital signs in last 24 hours: Vitals:   02/04/18 0510 02/04/18 0733  BP: 123/65 107/65  Pulse: 65 63  Resp: 18 20  Temp: 97.6 F (36.4 C) 97.6 F (36.4 C)  SpO2:  96%   Wt Readings from Last 3 Encounters:  01/18/18 56.5 kg    General: Pleasant, non-ill-appearing WF.  Sitting comfortably in bed. Head: No facial asymmetry or swelling.  No signs of head trauma. Eyes: No scleral icterus.  No conjunctival pallor.  EOMI. Ears: Not hard of hearing. Nose: No discharge, no congestion. Mouth: Oral mucosa pink, moist, clear.  Tongue midline.  Fair dentition. Neck: No JVD, no masses, no thyromegaly.  Bilateral carotid bruits. Lungs: Clear bilaterally.  No cough.  No labored breathing. Heart: RRR.  No MRG.  S1 and S2 present. Abdomen: Soft.  Not tender or distended.  Well-healed, small scar in the mid abdomen a couple of inches above her umbilicus consistent with previous gastrostomy tube.   Rectal: No masses, no blood, no stool. Musc/Skeltl: No joint redness, swelling or obvious deformities. Extremities: No CCE. Neurologic: Oriented x3.  Fully alert.  Moves all 4 limbs, strength not tested.  Good historian. Skin: No rash, no sores Tattoos: Across the upper chest is a fading tattoo. Nodes: No cervical adenopathy. Psych: Cooperative, calm, pleasant.  Intake/Output from previous day: No intake/output data recorded. Intake/Output this shift: No intake/output data recorded.  LAB RESULTS: Recent Labs    02/04/18 0404  WBC 5.1  HGB 12.6  HCT 39.3  PLT 240   BMET Lab Results  Component Value Date   NA 142 02/04/2018   K 3.6 02/04/2018   CL 110 02/04/2018   CO2 22 02/04/2018   GLUCOSE 77 02/04/2018   BUN 9 02/04/2018   CREATININE 0.66 02/04/2018   CALCIUM 9.0 02/04/2018   LFT No results for input(s): PROT, ALBUMIN, AST, ALT, ALKPHOS, BILITOT,  BILIDIR, IBILI in the last 72 hours. PT/INR Lab Results  Component Value Date   INR 1.03 01/18/2018   Hepatitis Panel No results for input(s): HEPBSAG, HCVAB, HEPAIGM, HEPBIGM in the last 72 hours. C-Diff No components found for: CDIFF Lipase  No results found for: LIPASE  Drugs of Abuse     Component Value Date/Time   LABOPIA NONE DETECTED 01/19/2018 1821   COCAINSCRNUR NONE DETECTED 01/19/2018 1821   LABBENZ NONE DETECTED 01/19/2018 1821   AMPHETMU NONE DETECTED 01/19/2018 1821   THCU NONE DETECTED 01/19/2018 1821   LABBARB NONE DETECTED 01/19/2018 1821     RADIOLOGY STUDIES: No results found.    IMPRESSION:   *  Small quantity hematemesis.  One occurrence, no further N/V.   R/o retching related minor injury to esophagus, r/o ulcer disease.    *  Declining Hgb, not anemic.    *  Hx CVA 2015.  Recent TIA.  bil carotid stenosis.  R CEA planned, date not yet defined.  On 81 mg ASA. Temporary memory lapse without other new neuro deficits last PM.       PLAN:     *  Switch to PPI daily for at least 6 weeks. No plans for EGD.   GI fup prn.    Ordered serum H Pylori, if + will need eradication treatment.   Regular diet.     Azucena Freed  02/04/2018, 8:25 AM Phone [phone]  GI ATTENDING  History, laboratories, x-rays reviewed. Patient personally seen and examined. The patient presented to the hospital with altered mental status. Around the time of this began she had an episode of vomiting with trivial blood reported. No recurrent episodes. Neurologic symptoms have resolved. No abdominal complaints. Eating lunch without difficulties. Hemoglobin stable. Recommend empiric PPI for 6-8 weeks. I feel that she probably had a TIA with altered mental status and accompanying nausea and vomiting from a central origin. She is supposed to see Dr. Donnetta Hutching regarding carotid endarterectomy next week. You may want to notify him of this interval event. I will leave this to the primary  service. No plans for endoscopy. Please call for any questions or assistance. Will sign off  Analena Gama N. Geri Seminole., M.D. [facility] Division of Gastroenterology

## 2018-02-04 NOTE — Discharge Summary (Signed)
Physician Discharge Summary  Jill Williams VVO:[id number] DOB: 1970-08-25 DOA: 02/04/2018  PCP: Maris Berger, MD  Admit date: 02/04/2018 Discharge date: 02/04/2018  Admitted From: Home Disposition: Home home  Recommendations for Outpatient Follow-up:  1. Follow up with PCP in 1-2 weeks.  Please check serum H. pylori antigen sent this morning and if positive patient needs to be treated. 2. Patient reports having an appointment to see vascular surgery Dr. early next week. 3. Patient instructed to schedule appointment with [facility] if she has further episodes of hematemesis.   Home Health: None Equipment/Devices: None  Discharge Condition: Fair CODE STATUS: Full code Diet recommendation: Heart Healthy     Discharge Diagnoses:  Principal Problem:   Hematemesis with nausea  Active Problems:   Essential hypertension   HLD (hyperlipidemia)   Tobacco abuse   Carotid artery stenosis, asymptomatic, bilateral   Acute encephalopathy   History of stroke with residual deficit  Brief narrative/HPI Please refer to admission H&P for details, in brief, 47 year old female with recent hospitalization 2 weeks back for syncope secondary to TIA with finding of bilateral carotid artery stenosis (right ICA 90% stenosed with moderate to severe left carotid stenosis) and plan on elective right carotid endarterectomy as outpatient, hemorrhagic stroke in 2015 with residual left upper extremity weakness, hypertension, ongoing tobacco use and hyperlipidemia presented to the end of hospital ED with an episode of hematemesis with nausea.  Patient reports small amount of hematemesis with dark blood of one episode associated with nausea.  No prior symptoms of hematemesis or melena.  Denies use of NSAIDs and last EGD was almost 30 years back.  Denies any headache, dizziness, chest pain, palpitations, shortness of breath, abdominal pain, melena or bright red blood per rectum.  Denies any dysuria or  increased weakness or numbness of the extremities. Hemoglobin was 13.3 with other labs being normal.  Hospitalist consulted and patient transferred to [facility] for further evaluation and GI consult.  Hospital course Hematemesis No further episode and of small quantity.  Hemoglobin of 12.6 (dropped from 13.3 at [facility] yesterday).  No baseline in the system. GI consult appreciated, given single episode of small amount of hematemesis and no further symptoms, recommend PPI daily for at least 6 weeks.  No plan for EGD and outpatient GI follow-up if she has further symptoms. Ordered serum H. pylori and if positive will need to be treated.  No further hematemesis or nausea since admission.  Resume diet and if tolerated can be discharged home.  Resume aspirin upon discharge.  TIA with recently diagnosed bilateral carotid stenosis  Right carotid endarterectomy planned as outpatient.  She reports having an appointment to see Dr. early next week.  Continue aspirin and statin.  Tobacco abuse Counseled strongly on cessation.  Nicotine patch prescribed.  Consult: [facility] Procedure: None Disposition: Home   Discharge Instructions   Allergies as of 02/04/2018      Reactions   Ivp Dye [iodinated Diagnostic Agents] Shortness Of Breath, Rash   Codeine Nausea And Vomiting   Shrimp [shellfish Allergy] Nausea And Vomiting   Influenza Vaccine Live Rash   Morphine And Related Rash   Pneumococcal Vaccines Rash      Medication List    STOP taking these medications   famotidine 40 MG tablet Commonly known as:  PEPCID     TAKE these medications   aspirin 81 MG EC tablet Take 1 tablet (81 mg total) by mouth daily.   atorvastatin 80 MG tablet  Commonly known as:  LIPITOR Take 1 tablet (80 mg total) by mouth daily at 6 PM.   lisinopril 5 MG tablet Commonly known as:  PRINIVIL,ZESTRIL Take 5 mg by mouth daily.   metoprolol succinate 50 MG 24 hr tablet Commonly known as:   TOPROL-XL Take 25 mg by mouth daily.   nicotine 7 mg/24hr patch Commonly known as:  NICODERM CQ - dosed in mg/24 hr Place 1 patch (7 mg total) onto the skin daily.   pantoprazole 40 MG tablet Commonly known as:  PROTONIX Take 1 tablet (40 mg total) by mouth daily at 6 (six) AM.      Follow-up Information    Early, Arvilla Meres, MD Follow up in 1 week(s).   Specialties:  Vascular Surgery, Cardiology Why:  has appointment Contact information: [address] [place] [address] [phone]        Irene Shipper, MD Follow up in 4 week(s).   Specialty:  Gastroenterology Why:  as needed if patieent has recurrent hematemesis Contact information: [address] [place] [address] [phone]          Allergies  Allergen Reactions  . Ivp Dye [Iodinated Diagnostic Agents] Shortness Of Breath and Rash  . Codeine Nausea And Vomiting  . Shrimp [Shellfish Allergy] Nausea And Vomiting  . Influenza Vaccine Live Rash  . Morphine And Related Rash  . Pneumococcal Vaccines Rash      Procedures/Studies: Mr Brain Wo Contrast  Result Date: 01/19/2018 CLINICAL DATA:  47 y/o F; transient episode of right-sided weakness. EXAM: MRI HEAD WITHOUT CONTRAST TECHNIQUE: Multiplanar, multiecho pulse sequences of the brain and surrounding structures were obtained without intravenous contrast. COMPARISON:  01/17/2018 CT head. 01/18/2018 CT angiogram of head and neck. FINDINGS: Brain: No acute infarction, hemorrhage, hydrocephalus, extra-axial collection or mass lesion. Chronic hemosiderin stained infarct involving the right insula, lentiform nucleus, caudate head/body, and corona radiata. Wallerian degeneration of the right cerebral peduncle and hemi pons. Vascular: Normal flow voids. Skull and upper cervical spine: Normal marrow signal. Sinuses/Orbits: Negative. Other: None. IMPRESSION: 1. No acute intracranial abnormality identified. 2. Right basal ganglia chronic infarct with wallerian  degeneration of right cerebral peduncle and hemi pons. Electronically Signed   By: Kristine Garbe M.D.   On: 01/19/2018 02:53       Subjective: Denies further hematemesis or nausea.   Discharge Exam: Vitals:   02/04/18 0510 02/04/18 0733  BP: 123/65 107/65  Pulse: 65 63  Resp: 18 20  Temp: 97.6 F (36.4 C) 97.6 F (36.4 C)  SpO2:  96%   Vitals:   02/04/18 0253 02/04/18 0510 02/04/18 0733  BP: (!) 149/84 123/65 107/65  Pulse: 78 65 63  Resp: 18 18 20   Temp: (!) 97.5 F (36.4 C) 97.6 F (36.4 C) 97.6 F (36.4 C)  TempSrc: Oral Oral Oral  SpO2:   96%    General: Not in distress HEENT: No pallor, moist mucosa, supple neck Chest: Clear bilaterally CVs: Normal S1-S2, no murmurs GI: Soft, nondistended, nontender Musculoskeletal: Warm, no edema    The results of significant diagnostics from this hospitalization (including imaging, microbiology, ancillary and laboratory) are listed below for reference.     Microbiology: No results found for this or any previous visit (from the past 240 hour(s)).   Labs: BNP (last 3 results) No results for input(s): BNP in the last 8760 hours. Basic Metabolic Panel: Recent Labs  Lab 02/04/18 0404  NA 142  K 3.6  CL 110  CO2 22  GLUCOSE 77  BUN 9  CREATININE 0.66  CALCIUM 9.0   Liver Function Tests: No results for input(s): AST, ALT, ALKPHOS, BILITOT, PROT, ALBUMIN in the last 168 hours. No results for input(s): LIPASE, AMYLASE in the last 168 hours. No results for input(s): AMMONIA in the last 168 hours. CBC: Recent Labs  Lab 02/04/18 0404  WBC 5.1  NEUTROABS 2.3  HGB 12.6  HCT 39.3  MCV 90.1  PLT 240   Cardiac Enzymes: No results for input(s): CKTOTAL, CKMB, CKMBINDEX, TROPONINI in the last 168 hours. BNP: Invalid input(s): POCBNP CBG: No results for input(s): GLUCAP in the last 168 hours. D-Dimer No results for input(s): DDIMER in the last 72 hours. Hgb A1c No results for input(s): HGBA1C in  the last 72 hours. Lipid Profile No results for input(s): CHOL, HDL, LDLCALC, TRIG, CHOLHDL, LDLDIRECT in the last 72 hours. Thyroid function studies No results for input(s): TSH, T4TOTAL, T3FREE, THYROIDAB in the last 72 hours.  Invalid input(s): FREET3 Anemia work up No results for input(s): VITAMINB12, FOLATE, FERRITIN, TIBC, IRON, RETICCTPCT in the last 72 hours. Urinalysis No results found for: COLORURINE, APPEARANCEUR, LABSPEC, [place], GLUCOSEU, HGBUR, BILIRUBINUR, KETONESUR, PROTEINUR, UROBILINOGEN, NITRITE, LEUKOCYTESUR Sepsis Labs Invalid input(s): PROCALCITONIN,  WBC,  LACTICIDVEN Microbiology No results found for this or any previous visit (from the past 240 hour(s)).   Time coordinating discharge: < 30 minutes  SIGNED:   Louellen Molder, MD  [facility] 02/04/2018, 9:59 AM Pager   If 7PM-7AM, please contact night-coverage [email or web address] Password [email or web address]

## 2018-02-04 NOTE — H&P (Signed)
History and Physical    Jill Williams YJE:[id number] DOB: Jul 02, 1970 DOA: 02/04/2018  Referring MD/NP/PA: Gala Romney, MD PCP: Maris Berger, MD  Patient coming from: [facility] transfer  Chief Complaint: Vomiting blood  I have personally briefly reviewed patient's old medical records in [place]   HPI: Jill Williams is a 47 y.o. 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  Patient had been playing with her new puppy yesterday when she got acute onset of epigastric discomfort and vomited a small amount of dark red blood.  Thereafter patient reported feeling lightheaded and immediately tried to call for 911.  However, she did not remember her name or the street address.  She admits that she was very anxious at the time.  She currently is only on 81 mg of aspirin and denies being on any other blood thinners.  Upon admission into the emergency department patient had been noted to have stable vital signs.  Initial CT scan of the brain showed chronic right basal ganglia infarct with volume loss.  Chest x-ray showed no acute abnormalities.  Labs revealed hemoglobin of 13.3, other labs relatively within normal limits.  TRH called to admit patient accepted to a telemetry bed as inpatient here at [facility].  She had been just recently admitted to the hospital for TIA found to have bilateral carotid artery stenosis with right ICA 90% stenosis.  Vascular surgery was consulted and Dr. he recommended elective right carotid endarterectomy.  Patient reports   ED Course: As seen above  Review of Systems  Constitutional: Negative for chills, fever and weight loss.  HENT: Negative for ear discharge and nosebleeds.   Eyes: Negative for photophobia and pain.  Respiratory: Negative for  cough and shortness of breath.   Cardiovascular: Negative for chest pain and leg swelling.  Gastrointestinal: Positive for abdominal pain, nausea and vomiting.       Positive for hemoptysis   Genitourinary: Negative for dysuria and hematuria.  Musculoskeletal: Negative for back pain and myalgias.  Neurological: Negative for focal weakness and loss of consciousness.  Psychiatric/Behavioral: Negative for substance abuse and suicidal ideas.    Past Medical History:  Diagnosis Date  . Disorder of gallbladder 2000   pt states gallbladder was removed  . Essential hypertension   . HLD (hyperlipidemia)   . S/P partial hysterectomy 2004   "whole right side removed"  . Stroke ([place]) 05/10/2014  . Tobacco abuse   . Tubal ligation status 2000   "tied and bunt"    Past Surgical History:  Procedure Laterality Date  . ABDOMINAL HYSTERECTOMY    . CHOLECYSTECTOMY       reports that she has been smoking cigarettes. She has a 34.00 pack-year smoking history. She has never used smokeless tobacco. She reports that she does not drink alcohol or use drugs.  Allergies  Allergen Reactions  . Ivp Dye [Iodinated Diagnostic Agents] Shortness Of Breath and Rash  . Codeine Nausea And Vomiting  . Shrimp [Shellfish Allergy] Nausea And Vomiting  . Influenza Vaccine Live Rash  . Morphine And Related Rash  . Pneumococcal Vaccines Rash    Family History  Problem Relation Age of Onset  . Hypertension Mother   . Lung cancer Father     Prior to Admission medications   Medication Sig Start Date End  Date Taking? Authorizing Provider  aspirin EC 81 MG EC tablet Take 1 tablet (81 mg total) by mouth daily. 01/20/18   Florencia Reasons, MD  atorvastatin (LIPITOR) 80 MG tablet Take 1 tablet (80 mg total) by mouth daily at 6 PM. 01/19/18   Florencia Reasons, MD  famotidine (PEPCID) 40 MG tablet Take 40 mg by mouth daily. 11/14/17   [provider]  lisinopril (PRINIVIL,ZESTRIL) 5 MG tablet Take 5 mg by mouth daily. 11/14/17    [provider]  metoprolol succinate (TOPROL-XL) 50 MG 24 hr tablet Take 25 mg by mouth daily. 11/14/17   [provider]    Physical Exam:  Constitutional: NAD, calm, comfortable Vitals:   02/04/18 0253  BP: (!) 149/84  Pulse: 78  Resp: 18  Temp: (!) 97.5 F (36.4 C)  TempSrc: Oral   Eyes: PERRL, lids and conjunctivae normal ENMT: Mucous membranes are dry. Posterior pharynx clear of any exudate or lesions.   Neck: normal, supple, no masses, no thyromegaly Respiratory: clear to auscultation bilaterally, no wheezing, no crackles. Normal respiratory effort. No accessory muscle use.  Cardiovascular: Regular rate and rhythm, no murmurs / rubs / gallops. No extremity edema. 2+ pedal pulses. No carotid bruits.  Abdomen: no tenderness, no masses palpated. No hepatosplenomegaly. Bowel sounds positive.  Musculoskeletal: no clubbing / cyanosis. No joint deformity upper and lower extremities. Good ROM, no contractures. Normal muscle tone.   Skin: no rashes, lesions, ulcers. No induration Neurologic: CN 2-12 grossly intact. Left sided hemiparesis left upper extremity 3/5 and left lower extremity 4/5.  Mild dysarthria. Psychiatric: Normal judgment and insight. Alert and oriented x 3. Normal mood.     Labs on Admission: I have personally reviewed following labs and imaging studies  CBC: No results for input(s): WBC, NEUTROABS, HGB, HCT, MCV, PLT in the last 168 hours. Basic Metabolic Panel: No results for input(s): NA, K, CL, CO2, GLUCOSE, BUN, CREATININE, CALCIUM, MG, PHOS in the last 168 hours. GFR: CrCl cannot be calculated (No successful lab value found.). Liver Function Tests: No results for input(s): AST, ALT, ALKPHOS, BILITOT, PROT, ALBUMIN in the last 168 hours. No results for input(s): LIPASE, AMYLASE in the last 168 hours. No results for input(s): AMMONIA in the last 168 hours. Coagulation Profile: No results for input(s): INR, PROTIME in the last 168  hours. Cardiac Enzymes: No results for input(s): CKTOTAL, CKMB, CKMBINDEX, TROPONINI in the last 168 hours. BNP (last 3 results) No results for input(s): PROBNP in the last 8760 hours. HbA1C: No results for input(s): HGBA1C in the last 72 hours. CBG: No results for input(s): GLUCAP in the last 168 hours. Lipid Profile: No results for input(s): CHOL, HDL, LDLCALC, TRIG, CHOLHDL, LDLDIRECT in the last 72 hours. Thyroid Function Tests: No results for input(s): TSH, T4TOTAL, FREET4, T3FREE, THYROIDAB in the last 72 hours. Anemia Panel: No results for input(s): VITAMINB12, FOLATE, FERRITIN, TIBC, IRON, RETICCTPCT in the last 72 hours. Urine analysis: No results found for: COLORURINE, APPEARANCEUR, LABSPEC, PHURINE, GLUCOSEU, HGBUR, BILIRUBINUR, KETONESUR, PROTEINUR, UROBILINOGEN, NITRITE, LEUKOCYTESUR Sepsis Labs: No results found for this or any previous visit (from the past 240 hour(s)).   Radiological Exams on Admission: No results found.  EKG: Independently reviewed.  EKG showing normal sinus rhythm at 85 bpm with signs of LVH  Assessment/Plan Hematemesis with nausea: Acute.  Patient presents as a transfer from [place] after having one episode of vomiting dark red blood.  Patient with no subsequent episodes since that time, and hemoglobin noted to be  13.3 at outside facility. - Admit to a telemetry bed - Type and screen for blood products - Check CBC  - N.p.o.  - IV fluids at 75 mL/h - Will need to consult GI in a.m.  Bilateral carotid artery stenosis: Patient with known right internal carotid artery stenosis of 90% with moderate to severe left cardiac artery stenosis.  Patient to follow-up with Dr. Donnetta Hutching for elective right carotid endarterectomy. - Hold aspirin - Continue outpatient follow-up with Dr. Donnetta Hutching  Acute encephalopathy: Resolved.  Patient reports forgetting her name and address.  CT imaging at outside facility showed no acute abnormalities.  Suspect symptoms could be  related to anxiety.  Essential hypertension: - Continue lisinopril and metoprolol  Hyperlipidemia: LDL 114 on 8/12. - Continue atorvastatin  History of CVA with residual left weakness: History of a right basal ganglia intracranial hemorrhage versus hemorrhagic infarction in 2015 with chronic left upper extremity weakness worse than the left lower extremity. - Hold aspirin, restart when medically appropriate  Tobacco abuse - Smoking cessation - Nicotine patch  GERD - Continue Pepcid  DVT prophylaxis: SCDs  Code Status:Full Family Communication: no family present  Disposition Plan: Likely discharge home in 1 to 2 days. Consults called: GI Admission status: Inpatient  Norval Morton MD [facility] Pager [phone]   If 7PM-7AM, please contact night-coverage [email or web address] Password [facility]  02/04/2018, 3:18 AM

## 2018-02-05 LAB — H PYLORI, IGM, IGG, IGA AB: H PYLORI IGG: 0.31 {index_val} (ref 0.00–0.79)

## 2018-02-14 ENCOUNTER — Observation Stay (HOSPITAL_COMMUNITY): Payer: Medicaid Other

## 2018-02-14 ENCOUNTER — Inpatient Hospital Stay (HOSPITAL_COMMUNITY)
Admission: AD | Admit: 2018-02-14 | Discharge: 2018-02-18 | DRG: 038 | Disposition: A | Discharge status: Home / Self Care | Payer: Medicaid Other | Source: Other Acute Inpatient Hospital | Attending: Internal Medicine | Admitting: Internal Medicine

## 2018-02-14 ENCOUNTER — Observation Stay (HOSPITAL_BASED_OUTPATIENT_CLINIC_OR_DEPARTMENT_OTHER): Payer: Medicaid Other

## 2018-02-14 ENCOUNTER — Encounter (HOSPITAL_COMMUNITY): Payer: Self-pay | Admitting: Radiology

## 2018-02-14 DIAGNOSIS — Z9049 Acquired absence of other specified parts of digestive tract: Secondary | ICD-10-CM | POA: Diagnosis not present

## 2018-02-14 DIAGNOSIS — Z90711 Acquired absence of uterus with remaining cervical stump: Secondary | ICD-10-CM | POA: Diagnosis not present

## 2018-02-14 DIAGNOSIS — Z91013 Allergy to seafood: Secondary | ICD-10-CM

## 2018-02-14 DIAGNOSIS — Z720 Tobacco use: Secondary | ICD-10-CM | POA: Diagnosis present

## 2018-02-14 DIAGNOSIS — Z7982 Long term (current) use of aspirin: Secondary | ICD-10-CM

## 2018-02-14 DIAGNOSIS — K219 Gastro-esophageal reflux disease without esophagitis: Secondary | ICD-10-CM | POA: Diagnosis present

## 2018-02-14 DIAGNOSIS — G459 Transient cerebral ischemic attack, unspecified: Secondary | ICD-10-CM | POA: Diagnosis present

## 2018-02-14 DIAGNOSIS — I779 Disorder of arteries and arterioles, unspecified: Secondary | ICD-10-CM | POA: Diagnosis present

## 2018-02-14 DIAGNOSIS — Z8249 Family history of ischemic heart disease and other diseases of the circulatory system: Secondary | ICD-10-CM

## 2018-02-14 DIAGNOSIS — Z91041 Radiographic dye allergy status: Secondary | ICD-10-CM

## 2018-02-14 DIAGNOSIS — Z885 Allergy status to narcotic agent status: Secondary | ICD-10-CM | POA: Diagnosis not present

## 2018-02-14 DIAGNOSIS — F17210 Nicotine dependence, cigarettes, uncomplicated: Secondary | ICD-10-CM | POA: Diagnosis not present

## 2018-02-14 DIAGNOSIS — Z887 Allergy status to serum and vaccine status: Secondary | ICD-10-CM | POA: Diagnosis not present

## 2018-02-14 DIAGNOSIS — E7800 Pure hypercholesterolemia, unspecified: Secondary | ICD-10-CM | POA: Diagnosis present

## 2018-02-14 DIAGNOSIS — I739 Peripheral vascular disease, unspecified: Secondary | ICD-10-CM

## 2018-02-14 DIAGNOSIS — Z79899 Other long term (current) drug therapy: Secondary | ICD-10-CM

## 2018-02-14 DIAGNOSIS — I6523 Occlusion and stenosis of bilateral carotid arteries: Secondary | ICD-10-CM | POA: Diagnosis present

## 2018-02-14 DIAGNOSIS — I69354 Hemiplegia and hemiparesis following cerebral infarction affecting left non-dominant side: Secondary | ICD-10-CM | POA: Diagnosis not present

## 2018-02-14 DIAGNOSIS — I10 Essential (primary) hypertension: Secondary | ICD-10-CM | POA: Diagnosis present

## 2018-02-14 DIAGNOSIS — Z801 Family history of malignant neoplasm of trachea, bronchus and lung: Secondary | ICD-10-CM

## 2018-02-14 DIAGNOSIS — E785 Hyperlipidemia, unspecified: Secondary | ICD-10-CM | POA: Diagnosis present

## 2018-02-14 DIAGNOSIS — Z9851 Tubal ligation status: Secondary | ICD-10-CM | POA: Diagnosis not present

## 2018-02-14 DIAGNOSIS — I6521 Occlusion and stenosis of right carotid artery: Secondary | ICD-10-CM | POA: Diagnosis not present

## 2018-02-14 DIAGNOSIS — I6930 Unspecified sequelae of cerebral infarction: Secondary | ICD-10-CM

## 2018-02-14 LAB — CBC
HEMATOCRIT: 41.9 % (ref 36.0–46.0)
HEMOGLOBIN: 13.1 g/dL (ref 12.0–15.0)
MCH: 28.4 pg (ref 26.0–34.0)
MCHC: 31.3 g/dL (ref 30.0–36.0)
MCV: 90.9 fL (ref 78.0–100.0)
Platelets: 265 10*3/uL (ref 150–400)
RBC: 4.61 MIL/uL (ref 3.87–5.11)
RDW: 14.1 % (ref 11.5–15.5)
WBC: 7.1 10*3/uL (ref 4.0–10.5)

## 2018-02-14 LAB — COMPREHENSIVE METABOLIC PANEL
ALK PHOS: 78 U/L (ref 38–126)
ALT: 8 U/L (ref 0–44)
ANION GAP: 8 (ref 5–15)
AST: 14 U/L — ABNORMAL LOW (ref 15–41)
Albumin: 3.3 g/dL — ABNORMAL LOW (ref 3.5–5.0)
BILIRUBIN TOTAL: 0.5 mg/dL (ref 0.3–1.2)
BUN: 5 mg/dL — ABNORMAL LOW (ref 6–20)
CALCIUM: 8.9 mg/dL (ref 8.9–10.3)
CO2: 24 mmol/L (ref 22–32)
Chloride: 109 mmol/L (ref 98–111)
Creatinine, Ser: 0.68 mg/dL (ref 0.44–1.00)
GLUCOSE: 119 mg/dL — AB (ref 70–99)
Potassium: 3.6 mmol/L (ref 3.5–5.1)
Sodium: 141 mmol/L (ref 135–145)
TOTAL PROTEIN: 5.9 g/dL — AB (ref 6.5–8.1)

## 2018-02-14 LAB — HEPARIN LEVEL (UNFRACTIONATED): Heparin Unfractionated: 0.43 IU/mL (ref 0.30–0.70)

## 2018-02-14 MED ORDER — STROKE: EARLY STAGES OF RECOVERY BOOK
Freq: Once | Status: AC
  Administered 2018-02-14: 11:00:00

## 2018-02-14 MED ORDER — ACETAMINOPHEN 650 MG RE SUPP: 650.0000 mg | RECTAL | Status: DC | PRN

## 2018-02-14 MED ORDER — ATORVASTATIN CALCIUM 80 MG PO TABS
80.0000 mg | ORAL_TABLET | Freq: Every day | ORAL | Status: DC
  Administered 2018-02-14 – 2018-02-17 (×4): 80 mg via ORAL
  Filled 2018-02-14 (×4): qty 1

## 2018-02-14 MED ORDER — ACETAMINOPHEN 325 MG PO TABS: 650.0000 mg | ORAL_TABLET | ORAL | Status: DC | PRN

## 2018-02-14 MED ORDER — ENOXAPARIN SODIUM 40 MG/0.4ML ~~LOC~~ SOLN
40.0000 mg | SUBCUTANEOUS | Status: DC
  Administered 2018-02-14: 40 mg via SUBCUTANEOUS
  Filled 2018-02-14: qty 0.4

## 2018-02-14 MED ORDER — HEPARIN (PORCINE) IN NACL 100-0.45 UNIT/ML-% IJ SOLN
1000.0000 [IU]/h | INTRAMUSCULAR | Status: DC
  Administered 2018-02-14: 700 [IU]/h via INTRAVENOUS
  Filled 2018-02-14 (×2): qty 250

## 2018-02-14 MED ORDER — ASPIRIN EC 81 MG PO TBEC
81.0000 mg | DELAYED_RELEASE_TABLET | Freq: Every day | ORAL | Status: DC
  Administered 2018-02-14 – 2018-02-18 (×4): 81 mg via ORAL
  Filled 2018-02-14 (×4): qty 1

## 2018-02-14 MED ORDER — NICOTINE 7 MG/24HR TD PT24
7.0000 mg | MEDICATED_PATCH | Freq: Every day | TRANSDERMAL | Status: DC
  Administered 2018-02-14 – 2018-02-18 (×5): 7 mg via TRANSDERMAL
  Filled 2018-02-14 (×5): qty 1

## 2018-02-14 MED ORDER — PANTOPRAZOLE SODIUM 40 MG PO TBEC
40.0000 mg | DELAYED_RELEASE_TABLET | Freq: Every day | ORAL | Status: DC
  Administered 2018-02-14 – 2018-02-18 (×5): 40 mg via ORAL
  Filled 2018-02-14 (×5): qty 1

## 2018-02-14 MED ORDER — ACETAMINOPHEN 160 MG/5ML PO SOLN: 650.0000 mg | ORAL | Status: DC | PRN

## 2018-02-14 NOTE — Progress Notes (Signed)
ANTICOAGULATION CONSULT NOTE - follow-up Consult  Pharmacy Consult for heparin Indication: r/o CVA; TIA with prior history of stroke with residual left-sided weakness  Allergies  Allergen Reactions  . Ivp Dye [Iodinated Diagnostic Agents] Shortness Of Breath and Rash  . Codeine Nausea And Vomiting  . Shrimp [Shellfish Allergy] Nausea And Vomiting  . Influenza Vaccine Live Rash  . Morphine And Related Rash  . Pneumococcal Vaccines Rash    Patient Measurements: Height: 5\' 7"  (170.2 cm) Weight: 123 lb (55.8 kg) IBW/kg (Calculated) : 61.6 Heparin Dosing Weight: 56.5 kg  Vital Signs: Temp: 98 F (36.7 C) (09/07 1730) Temp Source: Oral (09/07 1730) BP: 154/85 (09/07 1910) Pulse Rate: 79 (09/07 1910)  Labs: Recent Labs    02/14/18 1032 02/14/18 2025  HGB 13.1  --   HCT 41.9  --   PLT 265  --   HEPARINUNFRC  --  0.43  CREATININE 0.68  --     Estimated Creatinine Clearance: 75.8 mL/min (by C-G formula based on SCr of 0.68 mg/dL).   Medical History: Past Medical History:  Diagnosis Date  . Disorder of gallbladder 2000   pt states gallbladder was removed  . Essential hypertension   . HLD (hyperlipidemia)   . S/P partial hysterectomy 2004   "whole right side removed"  . Stroke ([place]) 05/10/2014  . Tobacco abuse   . Tubal ligation status 2000   "tied and bunt"    Assessment: 60 yoF with PMH significant for prior CVA with residual left-sided weakness, hypertension, hyperlipidemia, tobacco abuse, who presented with new transient left-sided weakness. Pharmacy has been consulted for heparin dosing.  Heparin level 0.43   Goal of Therapy:  Heparin level 0.3-0.5 units/ml Monitor platelets by anticoagulation protocol: Yes   Plan:  Continue heparin drip at 700 units/hr Heparin level to confirm in 6 hours   Kreed Kauffman A. Levada Dy, PharmD, [place] Pager: [phone] Please utilize Amion for appropriate phone number to reach the unit pharmacist  ([place])    02/14/2018   10:14 PM

## 2018-02-14 NOTE — Consult Note (Signed)
REASON FOR CONSULT:    Carotid disease.  Consult is requested by Dr. Cruzita Lederer  HPI:   Jill Williams is a pleasant 47 y.o. female, who was seen in consultation by Dr. Sherren Mocha Early on 01/18/2018.  The patient has a history of a right brain stroke in 2015 and had been admitted to [facility] with new onset weakness.  CT angiogram showed a tight right carotid stenosis with a moderate left carotid stenosis.  The patient was transferred to [facility] for neurologic evaluation.  MRI during that admission showed an old right brain stroke but no new stroke.  The patient was scheduled to see Dr. Sherren Mocha Early in the office this coming Tuesday (02/17/2018), however she developed some left-sided weakness last night involving her arm and leg which lasted about an hour and therefore she was concerned and went to the [facility] emergency department.  She was transferred here and I was asked to see the patient.  On my history patient has had fairly significant left upper extremity since her stroke in 2015.  She is ambulatory.  When she was seen in August she states that the weakness in her left arm had worsened.  This lasted about an hour and resolved.  Now she has this new event with some worsening of the left arm and left lower extremity weakness which again lasted about an hour and then resolved.  I am not sure what studies of any were done at [facility] as I do not see any new x-rays in [facility].  Likewise her admission H&P is pending.  She tells me that her weakness is back to baseline on the left side.  She denies any expressive or receptive aphasia.  She denies any amaurosis fugax.  Her risk factors for peripheral vascular disease include hypertension, hypercholesterolemia, and a history of tobacco abuse.  She smoked 3 packs/day for many years and has been smoking since she was 47 years old.  She has cut back to 7 cigarettes a day.  She does take aspirin.  She thought she was on a statin however  we went through her medications in her purse and could not find a prescription for a statin so she is not sure she was on that.  Past Medical History:  Diagnosis Date  . Disorder of gallbladder 2000   pt states gallbladder was removed  . Essential hypertension   . HLD (hyperlipidemia)   . S/P partial hysterectomy 2004   "whole right side removed"  . Stroke ([place]) 05/10/2014  . Tobacco abuse   . Tubal ligation status 2000   "tied and bunt"    Family History  Problem Relation Age of Onset  . Hypertension Mother   . Lung cancer Father   There is no family history of premature cardiovascular disease.  SOCIAL HISTORY: Social History   Socioeconomic History  . Marital status: Unknown    Spouse name: Not on file  . Number of children: Not on file  . Years of education: Not on file  . Highest education level: Not on file  Occupational History  . Not on file  Social Needs  . Financial resource strain: Not on file  . Food insecurity:    Worry: Not on file    Inability: Not on file  . Transportation needs:    Medical: Not on file    Non-medical: Not on file  Tobacco Use  . Smoking status: Current Some Day Smoker    Packs/day: 1.00  Years: 34.00    Pack years: 34.00    Types: Cigarettes  . Smokeless tobacco: Never Used  Substance and Sexual Activity  . Alcohol use: Never    Frequency: Never  . Drug use: Never  . Sexual activity: Not Currently  Lifestyle  . Physical activity:    Days per week: Not on file    Minutes per session: Not on file  . Stress: Not on file  Relationships  . Social connections:    Talks on phone: Not on file    Gets together: Not on file    Attends religious service: Not on file    Active member of club or organization: Not on file    Attends meetings of clubs or organizations: Not on file    Relationship status: Not on file  . Intimate partner violence:    Fear of current or ex partner: Not on file    Emotionally abused: Not on file     Physically abused: Not on file    Forced sexual activity: Not on file  Other Topics Concern  . Not on file  Social History Narrative  . Not on file    Allergies  Allergen Reactions  . Ivp Dye [Iodinated Diagnostic Agents] Shortness Of Breath and Rash  . Codeine Nausea And Vomiting  . Shrimp [Shellfish Allergy] Nausea And Vomiting  . Influenza Vaccine Live Rash  . Morphine And Related Rash  . Pneumococcal Vaccines Rash    Current Facility-Administered Medications  Medication Dose Route Frequency Provider Last Rate Last Dose  .  stroke: mapping our early stages of recovery book   Does not apply Once Caren Griffins, MD      . acetaminophen (TYLENOL) tablet 650 mg  650 mg Oral Q4H PRN Caren Griffins, MD       Or  . acetaminophen (TYLENOL) solution 650 mg  650 mg Per Tube Q4H PRN Caren Griffins, MD       Or  . acetaminophen (TYLENOL) suppository 650 mg  650 mg Rectal Q4H PRN Caren Griffins, MD      . enoxaparin (LOVENOX) injection 40 mg  40 mg Subcutaneous Q24H Gherghe, Vella Redhead, MD        REVIEW OF SYSTEMS:  [X]  denotes positive finding, [ ]  denotes negative finding Cardiac  Comments:  Chest pain or chest pressure:    Shortness of breath upon exertion:    Short of breath when lying flat:    Irregular heart rhythm:        Vascular    Pain in calf, thigh, or hip brought on by ambulation:    Pain in feet at night that wakes you up from your sleep:     Blood clot in your veins:    Leg swelling:         Pulmonary    Oxygen at home:    Productive cough:     Wheezing:         Neurologic    Sudden weakness in arms or legs:  x  worsening weakness in the left arm as she has chronic weakness.  Also weakness in the left leg.  Sudden numbness in arms or legs:     Sudden onset of difficulty speaking or slurred speech:    Temporary loss of vision in one eye:     Problems with dizziness:         Gastrointestinal    Blood in stool:     Vomited blood:  Genitourinary    Burning when urinating:     Blood in urine:        Psychiatric    Major depression:         Hematologic    Bleeding problems:    Problems with blood clotting too easily:        Skin    Rashes or ulcers:        Constitutional    Fever or chills:     PHYSICAL EXAM:   Vitals:   02/14/18 0918  BP: (!) 156/87  Pulse: 71  Resp: 14  Temp: 98 F (36.7 C)  TempSrc: Oral   GENERAL: The patient is a well-nourished female, in no acute distress. The vital signs are documented above. CARDIAC: There is a regular rate and rhythm.  VASCULAR: She has a left carotid bruit. On the right side she has a palpable femoral, dorsalis pedis, and posterior tibial pulse. On the left side she has a palpable femoral and dorsalis pedis pulse. She has no significant lower extremity swelling. PULMONARY: There is good air exchange bilaterally without wheezing or rales. ABDOMEN: Soft and non-tender with normal pitched bowel sounds.  MUSCULOSKELETAL: There are no major deformities or cyanosis. NEUROLOGIC: She has some contracture in her left hand from her previous stroke.  She has chronic left upper extremity weakness which she says is now back to baseline.  Currently she has good strength in her lower extremities and her right upper extremity. SKIN: There are no ulcers or rashes noted. PSYCHIATRIC: The patient has a normal affect.  DATA:    ECHO: Her echo on 02/22/7828 showed systolic function was normal.  Ejection fraction was 60 to 65%.  LABS: Her GFR is greater than 60.  Hemoglobin 12.6.  Platelets 240,000.  LDL cholesterol 114.  Total cholesterol 177.   ASSESSMENT & PLAN:   SYMPTOMATIC RIGHT CAROTID STENOSIS: According to the previous notes the patient has a tight right carotid stenosis and was being considered for right carotid endarterectomy by Dr. Curt Jews.  She was scheduled to see him this coming Tuesday to discuss this surgery.  She had another episode of transient  left-sided weakness which lasted about an hour.  She has chronic significant left arm weakness related to her right brain stroke in 2015.  She is currently on aspirin.  Her home medications list Lipitor however we looked at all her medications in her purse and it looks like she may not have been taking this.   I will try to see what studies if any were done at [facility].  Even the previous CT scan from August is not currently in [place].  I have also ordered a carotid duplex scan.  I will discuss the timing of her right carotid endarterectomy with Dr. Donnetta Hutching.  Given that she has had recurrent symptoms I think it probably would be best for her to stay in the hospital for now and I will start IV heparin.   Deitra Mayo [facility] [phone]

## 2018-02-14 NOTE — Progress Notes (Signed)
Carotid duplex prelim: Bilateral 80-99% ICA stenosis. Landry Mellow, RDMS, RVT

## 2018-02-14 NOTE — H&P (Signed)
History and Physical    Jill Williams IRW:[id number] DOB: 11-25-1970 DOA: 02/14/2018  I have briefly reviewed the patient's prior medical records in [place]  PCP: Maris Berger, MD  Patient coming from: home  Chief Complaint: left sided weakness and numbness   HPI: Jill Williams is a 47 y.o. female with medical history significant of prior CVA with residual left-sided weakness, hypertension, hyperlipidemia, tobacco abuse, who presents to the hospital at [facility] with left-sided weakness and numbness along with dizziness.  She was hospitalized at [facility] twice in August,, first time with TIA (left-sided weakness) and at that time she was noted to have critical stenosis in her right carotid, and the second time with a GI bleed.  Initially vascular surgery was consulted for carotid endarterectomy, and she is scheduled to see Dr. Donnetta Hutching next week in office.  Yesterday she was in her normal state of health up until the evening when she noticed dizziness, left-sided weakness and left sided numbness which was new.  She got scared and presented to the ED.  Her symptoms have since resolved, and she does have left-sided weakness which is residual from her prior CVA.  Of note, she continues to smoke but has reduced the number of cigarettes from 60/day to only 6.  She denies any fever or chills, denies any chest pain, denies any shortness of breath, no abdominal pain, nausea vomiting.  CT angio 8/11 at [place]: 1. No large vessel occlusion, aneurysm, or vascular malformation. 2. Right proximal ICA severe 90% stenosis with predominant fibrofatty plaque. 3. Left proximal ICA severe 70% stenosis with small vessel lumen flap which may represent ulcerated fibrofatty plaque or chronic focal dissection. No findings to suggest acute dissection. 4. Intracranial atherosclerosis with mild-to-moderate bilateral paraclinoid ICA, moderate left vertebral artery, moderate left  proximal PCA, and mild right proximal PCA stenosis stenosis.  Head CT [facility] 9/6 IMPRESSION: No acute intracranial abnormalities. Old infarct in the right basal ganglia and anterior temporal region.   Review of Systems: As per HPI otherwise 10 point review of systems negative.   Past Medical History:  Diagnosis Date  . Disorder of gallbladder 2000   pt states gallbladder was removed  . Essential hypertension   . HLD (hyperlipidemia)   . S/P partial hysterectomy 2004   "whole right side removed"  . Stroke ([place]) 05/10/2014  . Tobacco abuse   . Tubal ligation status 2000   "tied and bunt"    Past Surgical History:  Procedure Laterality Date  . ABDOMINAL HYSTERECTOMY    . CHOLECYSTECTOMY       reports that she has been smoking cigarettes. She has a 34.00 pack-year smoking history. She has never used smokeless tobacco. She reports that she does not drink alcohol or use drugs.  Allergies  Allergen Reactions  . Ivp Dye [Iodinated Diagnostic Agents] Shortness Of Breath and Rash  . Codeine Nausea And Vomiting  . Shrimp [Shellfish Allergy] Nausea And Vomiting  . Influenza Vaccine Live Rash  . Morphine And Related Rash  . Pneumococcal Vaccines Rash    Family History  Problem Relation Age of Onset  . Hypertension Mother   . Lung cancer Father     Prior to Admission medications   Medication Sig Start Date End Date Taking? Authorizing Provider  aspirin EC 81 MG EC tablet Take 1 tablet (81 mg total) by mouth daily. 01/20/18   Florencia Reasons, MD  atorvastatin (LIPITOR) 80 MG tablet Take 1 tablet (80  mg total) by mouth daily at 6 PM. 01/19/18   Florencia Reasons, MD  lisinopril (PRINIVIL,ZESTRIL) 5 MG tablet Take 5 mg by mouth daily. 11/14/17   [provider]  metoprolol succinate (TOPROL-XL) 50 MG 24 hr tablet Take 25 mg by mouth daily. 11/14/17   [provider]  nicotine (NICODERM CQ) 7 mg/24hr patch Place 1 patch (7 mg total) onto the skin daily. 02/04/18   Dhungel, Flonnie Overman, MD   pantoprazole (PROTONIX) 40 MG tablet Take 1 tablet (40 mg total) by mouth daily at 6 (six) AM. 02/04/18   Dhungel, Flonnie Overman, MD    Physical Exam: Vitals:   02/14/18 0918 02/14/18 1106  BP: (!) 156/87 (!) 141/75  Pulse: 71 74  Resp: 14 19  Temp: 98 F (36.7 C)   TempSrc: Oral   SpO2:  97%    Constitutional: NAD, calm, comfortable Eyes: PERRL, lids and conjunctivae normal ENMT: Mucous membranes are moist.  Neck: normal, supple Respiratory: clear to auscultation bilaterally, no wheezing, no crackles. Cardiovascular: Regular rate and rhythm, no murmurs / rubs / gallops. No extremity edema. 2+ pedal pulses.  Abdomen: no tenderness, no masses palpated. Bowel sounds positive.  Musculoskeletal: no clubbing / cyanosis. Normal muscle tone.  Skin: no rashes, lesions, ulcers. No induration Neurologic: CN 2-12 grossly intact.  Left-sided weakness noted, left upper extremity more than left lower extremity.  Normal strength on the right. Psychiatric: Normal judgment and insight. Alert and oriented x 3. Normal mood.   Labs on Admission: I have personally reviewed following labs and imaging studies  CBC: Recent Labs  Lab 02/14/18 1032  WBC 7.1  HGB 13.1  HCT 41.9  MCV 90.9  PLT 161   Basic Metabolic Panel: Recent Labs  Lab 02/14/18 1032  NA 141  K 3.6  CL 109  CO2 24  GLUCOSE 119*  BUN 5*  CREATININE 0.68  CALCIUM 8.9   GFR: CrCl cannot be calculated (Unknown ideal weight.). Liver Function Tests: Recent Labs  Lab 02/14/18 1032  AST 14*  ALT 8  ALKPHOS 78  BILITOT 0.5  PROT 5.9*  ALBUMIN 3.3*   No results for input(s): LIPASE, AMYLASE in the last 168 hours. No results for input(s): AMMONIA in the last 168 hours. Coagulation Profile: No results for input(s): INR, PROTIME in the last 168 hours. Cardiac Enzymes: No results for input(s): CKTOTAL, CKMB, CKMBINDEX, TROPONINI in the last 168 hours. BNP (last 3 results) No results for input(s): PROBNP in the last 8760  hours. HbA1C: No results for input(s): HGBA1C in the last 72 hours. CBG: No results for input(s): GLUCAP in the last 168 hours. Lipid Profile: No results for input(s): CHOL, HDL, LDLCALC, TRIG, CHOLHDL, LDLDIRECT in the last 72 hours. Thyroid Function Tests: No results for input(s): TSH, T4TOTAL, FREET4, T3FREE, THYROIDAB in the last 72 hours. Anemia Panel: No results for input(s): VITAMINB12, FOLATE, FERRITIN, TIBC, IRON, RETICCTPCT in the last 72 hours. Urine analysis: No results found for: COLORURINE, APPEARANCEUR, LABSPEC, PHURINE, GLUCOSEU, HGBUR, BILIRUBINUR, KETONESUR, PROTEINUR, UROBILINOGEN, NITRITE, LEUKOCYTESUR   Radiological Exams on Admission: No results found.  EKG: Independently reviewed. [facility] ED EKG reviewed, normal sinus rhythm  Assessment/Plan Active Problems:   TIA (transient ischemic attack)   TIA with prior history of stroke with residual left-sided weakness -Transient left-sided weakness and numbness which have now resolved and she is at baseline.  Will obtain MRI of the brain to rule out CVA, if positive will consult neurology -For now continue aspirin, she is on heparin drip  as below  Bilateral carotid stenosis, critical on right -Vascular surgery consulted, appreciate input, currently recommendations are for heparin drip and keep in the hospital for now given recurrent symptoms  Recent admission with hematemesis -She was discharged on February 04, 2018, she had one episode of hematemesis and small quantity, GI consulted and given single episode and no further symptoms they did not do an EGD and recommended PPI daily for at least 6 weeks.  Continue PPI here  Hypertension -For now hold antihypertensives, MRI pending  Tobacco abuse -Counseled for complete cessation, nicotine patch  Hyperlipidemia -High-dose statin   DVT prophylaxis: heparin gtt  Code Status: Full code  Family Communication: no family at bedside Disposition Plan: tbd Consults  called: vascular surgery     Admission status: Observation  Marzetta Board, MD [facility] Pager [phone]  If 7PM-7AM, please contact night-coverage [email or web address] Password [email or web address]  02/14/2018, 11:14 AM

## 2018-02-14 NOTE — Progress Notes (Signed)
ANTICOAGULATION CONSULT NOTE - Initial Consult  Pharmacy Consult for heparin Indication: r/o CVA; TIA with prior history of stroke with residual left-sided weakness  Allergies  Allergen Reactions  . Ivp Dye [Iodinated Diagnostic Agents] Shortness Of Breath and Rash  . Codeine Nausea And Vomiting  . Shrimp [Shellfish Allergy] Nausea And Vomiting  . Influenza Vaccine Live Rash  . Morphine And Related Rash  . Pneumococcal Vaccines Rash    Patient Measurements: Weight: 124 lb 8 oz (56.5 kg)(Aug 2019) Heparin Dosing Weight: 56.5 kg  Vital Signs: Temp: 98 F (36.7 C) (09/07 0918) Temp Source: Oral (09/07 0918) BP: 141/75 (09/07 1106) Pulse Rate: 74 (09/07 1106)  Labs: Recent Labs    02/14/18 1032  HGB 13.1  HCT 41.9  PLT 265  CREATININE 0.68    Estimated Creatinine Clearance: 76.7 mL/min (by C-G formula based on SCr of 0.68 mg/dL).   Medical History: Past Medical History:  Diagnosis Date  . Disorder of gallbladder 2000   pt states gallbladder was removed  . Essential hypertension   . HLD (hyperlipidemia)   . S/P partial hysterectomy 2004   "whole right side removed"  . Stroke ([place]) 05/10/2014  . Tobacco abuse   . Tubal ligation status 2000   "tied and bunt"    Assessment: 54 yoF with PMH significant for prior CVA with residual left-sided weakness, hypertension, hyperlipidemia, tobacco abuse, who presented with new transient left-sided weakness. Pharmacy has been consulted for heparin dosing.  Baseline CBC is WNL and stable from her previous admission on 8/28. The patient has a history of GIB and one reported episode of hematemesis with trivial blood at her prior admission. However, none reported or documented this admission. As the patient has had a dose of enoxaparin 40 mg this morning, will conservatively dose heparin with 12 units/kg/hour (700 units/hr) with no bolus and check heparin level in 8 hours to minimize effect of enoxaparin on the level.   Goal of  Therapy:  Heparin level 0.3-0.5 units/ml Monitor platelets by anticoagulation protocol: Yes   Plan:  Start heparin infusion at 700 units/hr Check anti-Xa level in 8 hours and daily while on heparin Continue to monitor H&H and platelets   Brendolyn Patty, PharmD PGY1 Pharmacy Resident Phone [phone]  02/14/2018   11:47 AM

## 2018-02-15 DIAGNOSIS — I10 Essential (primary) hypertension: Secondary | ICD-10-CM | POA: Diagnosis present

## 2018-02-15 DIAGNOSIS — I6521 Occlusion and stenosis of right carotid artery: Secondary | ICD-10-CM

## 2018-02-15 DIAGNOSIS — Z9851 Tubal ligation status: Secondary | ICD-10-CM | POA: Diagnosis not present

## 2018-02-15 DIAGNOSIS — G459 Transient cerebral ischemic attack, unspecified: Secondary | ICD-10-CM | POA: Diagnosis present

## 2018-02-15 DIAGNOSIS — I739 Peripheral vascular disease, unspecified: Secondary | ICD-10-CM

## 2018-02-15 DIAGNOSIS — Z79899 Other long term (current) drug therapy: Secondary | ICD-10-CM | POA: Diagnosis not present

## 2018-02-15 DIAGNOSIS — Z90711 Acquired absence of uterus with remaining cervical stump: Secondary | ICD-10-CM | POA: Diagnosis not present

## 2018-02-15 DIAGNOSIS — Z8249 Family history of ischemic heart disease and other diseases of the circulatory system: Secondary | ICD-10-CM | POA: Diagnosis not present

## 2018-02-15 DIAGNOSIS — I779 Disorder of arteries and arterioles, unspecified: Secondary | ICD-10-CM | POA: Diagnosis present

## 2018-02-15 DIAGNOSIS — E7800 Pure hypercholesterolemia, unspecified: Secondary | ICD-10-CM | POA: Diagnosis present

## 2018-02-15 DIAGNOSIS — F17210 Nicotine dependence, cigarettes, uncomplicated: Secondary | ICD-10-CM | POA: Diagnosis present

## 2018-02-15 DIAGNOSIS — Z91013 Allergy to seafood: Secondary | ICD-10-CM | POA: Diagnosis not present

## 2018-02-15 DIAGNOSIS — Z801 Family history of malignant neoplasm of trachea, bronchus and lung: Secondary | ICD-10-CM | POA: Diagnosis not present

## 2018-02-15 DIAGNOSIS — E785 Hyperlipidemia, unspecified: Secondary | ICD-10-CM | POA: Diagnosis present

## 2018-02-15 DIAGNOSIS — Z91041 Radiographic dye allergy status: Secondary | ICD-10-CM | POA: Diagnosis not present

## 2018-02-15 DIAGNOSIS — Z887 Allergy status to serum and vaccine status: Secondary | ICD-10-CM | POA: Diagnosis not present

## 2018-02-15 DIAGNOSIS — I69354 Hemiplegia and hemiparesis following cerebral infarction affecting left non-dominant side: Secondary | ICD-10-CM | POA: Diagnosis not present

## 2018-02-15 DIAGNOSIS — K219 Gastro-esophageal reflux disease without esophagitis: Secondary | ICD-10-CM | POA: Diagnosis present

## 2018-02-15 DIAGNOSIS — I6930 Unspecified sequelae of cerebral infarction: Secondary | ICD-10-CM | POA: Diagnosis not present

## 2018-02-15 DIAGNOSIS — I6523 Occlusion and stenosis of bilateral carotid arteries: Secondary | ICD-10-CM | POA: Diagnosis present

## 2018-02-15 DIAGNOSIS — Z885 Allergy status to narcotic agent status: Secondary | ICD-10-CM | POA: Diagnosis not present

## 2018-02-15 DIAGNOSIS — Z7982 Long term (current) use of aspirin: Secondary | ICD-10-CM | POA: Diagnosis not present

## 2018-02-15 DIAGNOSIS — Z720 Tobacco use: Secondary | ICD-10-CM | POA: Diagnosis not present

## 2018-02-15 DIAGNOSIS — Z9049 Acquired absence of other specified parts of digestive tract: Secondary | ICD-10-CM | POA: Diagnosis not present

## 2018-02-15 LAB — LIPID PANEL
CHOL/HDL RATIO: 4.4 ratio
CHOLESTEROL: 159 mg/dL (ref 0–200)
HDL: 36 mg/dL — ABNORMAL LOW (ref >40)
LDL CALC: 87 mg/dL (ref 0–99)
TRIGLYCERIDES: 182 mg/dL — AB (ref <150)
VLDL: 36 mg/dL (ref 0–40)

## 2018-02-15 LAB — HEMOGLOBIN A1C
Hgb A1c MFr Bld: 5.1 % (ref 4.8–5.6)
Mean Plasma Glucose: 99.67 mg/dL

## 2018-02-15 LAB — HEPARIN LEVEL (UNFRACTIONATED): HEPARIN UNFRACTIONATED: 0.1 [IU]/mL — AB (ref 0.30–0.70)

## 2018-02-15 LAB — CBC
HCT: 41.8 % (ref 36.0–46.0)
Hemoglobin: 13.2 g/dL (ref 12.0–15.0)
MCH: 28.4 pg (ref 26.0–34.0)
MCHC: 31.6 g/dL (ref 30.0–36.0)
MCV: 90.1 fL (ref 78.0–100.0)
PLATELETS: 263 10*3/uL (ref 150–400)
RBC: 4.64 MIL/uL (ref 3.87–5.11)
RDW: 13.9 % (ref 11.5–15.5)
WBC: 4.9 10*3/uL (ref 4.0–10.5)

## 2018-02-15 NOTE — Evaluation (Signed)
Occupational Therapy Evaluation Patient Details Name: Jill Williams MRN: [id number] DOB: 1971-01-28 Today's Date: 02/15/2018    History of Present Illness 47 y.o. female with medical history significant of prior CVA with residual left-sided weakness, hypertension, hyperlipidemia, tobacco abuse, who presents to the hospital at [facility] with left-sided weakness and numbness along with dizziness. Her symptoms have since resolved, and she does have left-sided weakness which is residual from her prior CVA. She had bilateral severe carotid stenoses on her CT angiogram.  Her MRI yesterday showed no acute findings with a remote right basal ganglia infarct.  Plan is for right carotid endarterectomy.   Clinical Impression   Pt admitted with the above diagnoses and presents with below problem list. Pt will benefit from continued acute OT to address the below listed deficits and maximize independence with basic ADLs prior to d/c home. PTA pt was mod I with ADLs. Pt is currently setup to supervision with LB ADLs and functional mobility/transfers.       Follow Up Recommendations  No OT follow up    Equipment Recommendations  None recommended by OT    Recommendations for Other Services       Precautions / Restrictions Precautions Precautions: Fall Restrictions Weight Bearing Restrictions: No      Mobility Bed Mobility Overal bed mobility: Modified Independent                Transfers Overall transfer level: Modified independent                    Balance Overall balance assessment: Needs assistance         Standing balance support: During functional activity Standing balance-Leahy Scale: Fair Standing balance comment: no physical assist required                           ADL either performed or assessed with clinical judgement   ADL Overall ADL's : Needs assistance/impaired Eating/Feeding: Set up;Sitting   Grooming:  Supervision/safety;Standing   Upper Body Bathing: Sitting;Set up   Lower Body Bathing: Supervison/ safety;Sit to/from stand   Upper Body Dressing : Set up;Sitting   Lower Body Dressing: Supervision/safety;Sit to/from stand   Toilet Transfer: Supervision/safety;Ambulation   Toileting- Clothing Manipulation and Hygiene: Supervision/safety;Sit to/from stand   Tub/ Shower Transfer: Supervision/safety;Ambulation   Functional mobility during ADLs: Supervision/safety General ADL Comments: Pt completed in-room functional mobility, toilet transfer, and donned shoes.      Vision         Perception     Praxis      Pertinent Vitals/Pain Pain Assessment: No/denies pain     Hand Dominance Right   Extremity/Trunk Assessment Upper Extremity Assessment Upper Extremity Assessment: LUE deficits/detail LUE Deficits / Details: Pt reports no change in baseline residual weakness in LUE.   Lower Extremity Assessment Lower Extremity Assessment: Defer to PT evaluation       Communication Communication Communication: No difficulties   Cognition Arousal/Alertness: Awake/alert Behavior During Therapy: WFL for tasks assessed/performed Overall Cognitive Status: Within Functional Limits for tasks assessed                                     General Comments       Exercises     Shoulder Instructions      Home Living Family/patient expects to be discharged to:: Private residence Living Arrangements: Non-relatives/Friends Available  Help at Discharge: Friend(s);Available 24 hours/day Type of Home: House Home Access: Stairs to enter [employer] of Steps: 2 Entrance Stairs-Rails: Can reach both;Right;Left Home Layout: One level     Bathroom Shower/Tub: Teacher, early years/pre: Standard     Home Equipment: Environmental consultant - standard   Additional Comments: has a RW but reports not using in several years      Prior Functioning/Environment Level of  Independence: Independent with assistive device(s)  Gait / Transfers Assistance Needed: mod I per pt report ADL's / Homemaking Assistance Needed: mod per pt report            OT Problem List: Decreased activity tolerance;Impaired balance (sitting and/or standing)      OT Treatment/Interventions: Self-care/ADL training;Energy conservation;Therapeutic activities;Patient/family education;Balance training    OT Goals(Current goals can be found in the care plan section) Acute Rehab OT Goals Patient Stated Goal: get better for my 3 triplet grandbabies that are on the way OT Goal Formulation: With patient Time For Goal Achievement: 02/22/18 Potential to Achieve Goals: Good ADL Goals Pt Will Perform Grooming: with modified independence;standing Pt Will Perform Lower Body Bathing: with modified independence;sit to/from stand Pt Will Perform Lower Body Dressing: with modified independence;sit to/from stand Pt Will Perform Tub/Shower Transfer: with modified independence;ambulating  OT Frequency: Min 1X/week   Barriers to D/C:            Co-evaluation              AM-PAC PT "6 Clicks" Daily Activity     Outcome Measure Help from another person eating meals?: None Help from another person taking care of personal grooming?: None Help from another person toileting, which includes using toliet, bedpan, or urinal?: None Help from another person bathing (including washing, rinsing, drying)?: A Little Help from another person to put on and taking off regular upper body clothing?: None Help from another person to put on and taking off regular lower body clothing?: None 6 Click Score: 23   End of Session    Activity Tolerance: Patient tolerated treatment well Patient left: in bed;with call bell/phone within reach  OT Visit Diagnosis: Muscle weakness (generalized) (M62.81)                Time: 6440-3474 OT Time Calculation (min): 16 min Charges:  OT General Charges $OT Visit: 1  Visit OT Evaluation $OT Eval Low Complexity: 1 Low    Hortencia Pilar 02/15/2018, 9:30 AM

## 2018-02-15 NOTE — Evaluation (Signed)
Speech Language Pathology Evaluation Patient Details Name: Katryna Tschirhart MRN: [id number] DOB: 04-05-1971 Today's Date: 02/15/2018 Time: 1340-1400 SLP Time Calculation (min) (ACUTE ONLY): 20 min  Problem List:  Patient Active Problem List   Diagnosis Date Noted  . Right-sided carotid artery disease ([place]) 02/15/2018  . Hematemesis with nausea 02/04/2018  . Acute encephalopathy 02/04/2018  . History of stroke with residual deficit 02/04/2018  . GI bleed 02/04/2018  . Tobacco abuse counseling   . TIA (transient ischemic attack) 01/19/2018  . Syncope 01/19/2018  . Carotid artery stenosis, asymptomatic, bilateral 01/18/2018  . Stroke (cerebrum) ([place]) 01/18/2018  . Essential hypertension   . HLD (hyperlipidemia)   . Tobacco abuse   . Stroke [facility]) 05/10/2014   Past Medical History:  Past Medical History:  Diagnosis Date  . Disorder of gallbladder 2000   pt states gallbladder was removed  . Essential hypertension   . HLD (hyperlipidemia)   . S/P partial hysterectomy 2004   "whole right side removed"  . Stroke ([place]) 05/10/2014  . Tobacco abuse   . Tubal ligation status 2000   "tied and bunt"   Past Surgical History:  Past Surgical History:  Procedure Laterality Date  . ABDOMINAL HYSTERECTOMY    . CHOLECYSTECTOMY     HPI:  Brylynn Hanssen is a 47 y.o. female with medical history significant of prior CVA with residual left-sided weakness, hypertension, hyperlipidemia, tobacco abuse, who presents to the hospital at [facility] with left-sided weakness and numbness along with dizziness.  She was hospitalized at [facility] twice in August,, first time with TIA (left-sided weakness) and at that time she was noted to have critical stenosis in her right carotid, and the second time with a GI bleed.  Initially vascular surgery was consulted for carotid endarterectomy, and she is scheduled to see Dr. Donnetta Hutching next week in office.  Yesterday she was in her normal state of health  up until the evening when she noticed dizziness, left-sided weakness and left sided numbness which was new.  She got scared and presented to the ED.  Her symptoms have since resolved, and she does have left-sided weakness which is residual from her prior CVA.  Of note, she continues to smoke but has reduced the number of cigarettes from 60/day to only 6.  She denies any fever or chills, denies any chest pain, denies any shortness of breath, no abdominal pain, nausea vomiting.  MRI is negative for acute findings but showing a remote right basal ganglia infarct.     Assessment / Plan / Recommendation Clinical Impression  Cognitive/linguistic and motor speech screen were complete.  Oral mechanism exam was completed and unremarkable.  The patient's speech was clear and easy to understand. No discernible dysarthria or apraxia.  She achieved a score of 28/30 on the Mini Mental State Exam suggesting functional cognitive/linguistic skills.  She was oriented x 4, had good immediate (ie 3/3) and delayed recall (ie 2/3, semantic cue facilitated recall of third word) of 3 novels words and attended well to task.  She was able to name objects, repeat a short sentence, follow a 3 step command, read/comprehend a short sentence and write a sentence.  She struggled to copy a Agricultural consultant.  She was able to provide logical solutions to simple problems and complete a clock drawing task.  She was well aware of why she was hospitalized and the current plan for treatment.  Acute ST needs are not identified.      SLP Assessment  SLP Recommendation/Assessment: Patient does not need any further Speech Lanaguage Pathology Services SLP Visit Diagnosis: Cognitive communication deficit (R41.841)    Follow Up Recommendations  None          SLP Evaluation Cognition  Overall Cognitive Status: Within Functional Limits for tasks assessed Arousal/Alertness: Awake/alert Orientation Level: Oriented X4 Attention: Focused Focused Attention:  Appears intact Memory: Appears intact Awareness: Appears intact Problem Solving: Appears intact Safety/Judgment: Appears intact       Comprehension  Auditory Comprehension Overall Auditory Comprehension: Appears within functional limits for tasks assessed Commands: Within Functional Limits Conversation: Complex Reading Comprehension Reading Status: Within funtional limits    Expression Verbal Expression Overall Verbal Expression: Appears within functional limits for tasks assessed Initiation: No impairment Automatic Speech: Name;Social Response Level of Generative/Spontaneous Verbalization: Conversation Repetition: No impairment Naming: No impairment Pragmatics: No impairment Non-Verbal Means of Communication: Not applicable Written Expression Dominant Hand: Right Written Expression: Within Functional Limits   Oral / Motor  Oral Motor/Sensory Function Overall Oral Motor/Sensory Function: Within functional limits Motor Speech Overall Motor Speech: Appears within functional limits for tasks assessed Respiration: Within functional limits Phonation: Normal Resonance: Within functional limits Articulation: Within functional limitis Intelligibility: Intelligible Motor Planning: Witnin functional limits Motor Speech Errors: Not applicable   GO                   Shelly Flatten, MA, CCC-SLP Acute Rehab SLP [phone]  Lamar Sprinkles 02/15/2018, 2:15 PM

## 2018-02-15 NOTE — Progress Notes (Signed)
ANTICOAGULATION CONSULT NOTE - follow-up Consult  Pharmacy Consult for heparin Indication: r/o CVA; TIA with prior history of stroke with residual left-sided weakness  Allergies  Allergen Reactions  . Ivp Dye [Iodinated Diagnostic Agents] Shortness Of Breath and Rash  . Codeine Nausea And Vomiting  . Shrimp [Shellfish Allergy] Nausea And Vomiting  . Influenza Vaccine Live Rash  . Morphine And Related Rash  . Pneumococcal Vaccines Rash    Patient Measurements: Height: 5\' 7"  (170.2 cm) Weight: 123 lb (55.8 kg) IBW/kg (Calculated) : 61.6 Heparin Dosing Weight: 56.5 kg  Vital Signs: Temp: 98.9 F (37.2 C) (09/08 1545) Temp Source: Oral (09/08 1545) BP: 149/93 (09/08 1545) Pulse Rate: 88 (09/08 1545)  Labs: Recent Labs    02/14/18 1032 02/14/18 2025 02/15/18 0321 02/15/18 1533  HGB 13.1  --  13.2  --   HCT 41.9  --  41.8  --   PLT 265  --  263  --   HEPARINUNFRC  --  0.43  --  0.10*  CREATININE 0.68  --   --   --     Estimated Creatinine Clearance: 75.8 mL/min (by C-G formula based on SCr of 0.68 mg/dL).   Medical History: Past Medical History:  Diagnosis Date  . Disorder of gallbladder 2000   pt states gallbladder was removed  . Essential hypertension   . HLD (hyperlipidemia)   . S/P partial hysterectomy 2004   "whole right side removed"  . Stroke ([place]) 05/10/2014  . Tobacco abuse   . Tubal ligation status 2000   "tied and bunt"    Assessment: 16 yoF with PMH significant for prior CVA with residual left-sided weakness, hypertension, hyperlipidemia, tobacco abuse, who presented with new transient left-sided weakness. Pharmacy has been consulted for heparin dosing.  Heparin level 0.1. No infusion problems with heparin drip per RN. Will increase rate with no bolus due to CVA  Goal of Therapy:  Heparin level 0.3-0.5 units/ml Monitor platelets by anticoagulation protocol: Yes   Plan:  Increase heparin drip to 850 units/hr Heparin level in 6 hours Daily  HL and CBC while on heparin Monitor for s/sx bleeding   Tyree Vandruff A. Levada Dy, PharmD, [place] Pager: [phone] Please utilize Amion for appropriate phone number to reach the unit pharmacist ([place])    02/15/2018   4:56 PM

## 2018-02-15 NOTE — Progress Notes (Signed)
   VASCULAR SURGERY ASSESSMENT & PLAN:   BILATERAL GREATER THAN 80% CAROTID STENOSES: The patient has bilateral greater than 80% carotid stenoses.  The right what appears to be symptomatic.  This reason I would recommend right carotid endarterectomy as originally planned by Dr. Curt Jews.  Given that the patient has had repeated symptoms she is now on heparin.  I am trying to find a spot for her surgery next week.  She has had no further symptoms.  She is on aspirin and is on a statin.  I have reviewed her studies that were done at [facility].  She had bilateral severe carotid stenoses on her CT angiogram.  Her MRI yesterday showed no acute findings with a remote right basal ganglia infarct.  SUBJECTIVE:   No complaints this morning.  PHYSICAL EXAM:   Vitals:   02/14/18 1910 02/14/18 2130 02/14/18 2336 02/15/18 0358  BP: (!) 154/85 127/72 129/73 134/74  Pulse: 79 76 75 62  Resp: 16 14 14 15   Temp:  98 F (36.7 C) 98.2 F (36.8 C) 98 F (36.7 C)  TempSrc:  Oral Oral Oral  SpO2: 99% 95% 97% 99%  Weight:      Height:       No change in neuro exam.  She has some chronic weakness in the left upper extremity.  LABS:   Lab Results  Component Value Date   WBC 4.9 02/15/2018   HGB 13.2 02/15/2018   HCT 41.8 02/15/2018   MCV 90.1 02/15/2018   PLT 263 02/15/2018   Lab Results  Component Value Date   CREATININE 0.68 02/14/2018   Lab Results  Component Value Date   INR 1.03 01/18/2018   PROBLEM LIST:    Active Problems:   TIA (transient ischemic attack)  CURRENT MEDS:   . aspirin EC  81 mg Oral Daily  . atorvastatin  80 mg Oral q1800  . nicotine  7 mg Transdermal Daily  . pantoprazole  40 mg Oral Q0600    Deitra Mayo Beeper: [phone] Office: [phone] 02/15/2018

## 2018-02-15 NOTE — Progress Notes (Signed)
PROGRESS NOTE    Jill Williams  ZOX:[id number] DOB: [birth year] DOA: 02/14/2018 PCP: Maris Berger, MD   Brief Narrative:  47 year old woman with a history of stroke with residual left-sided weakness, and, tobacco abuse, hyperlipidemia, presenting to the [facility] 02/14/2018 with transient left-sided weakness and numbness as dizziness, quickly resolved.  Work-up including a CT of the head, MRI of the brain, MRA, was negative for stroke Recent CT angiogram of the neck,showed critical disease on the right at 90% with predominant fibrofatty plaque, for which vascular surgery was consulted.  She is scheduled for right carotid endarterectomy while in the hospital.  She is currently on heparin per pharmacy.  Assessment & Plan:   Principal Problem:   Right-sided carotid artery disease ([place]) Active Problems:   HLD (hyperlipidemia)   Tobacco abuse   TIA (transient ischemic attack)   History of stroke with residual deficit  TIA, with prior history of stroke with residual left-sided weakness.  The patient experienced a transient left-sided weakness and numbness, quickly resolved, and now is at baseline.  MRI of the brain was negative for CVA.  Symptoms may have been related to severe right carotid artery disease.  See below Continue aspirin Continue heparin Lipitor 80 grams daily Continue PT OT  Right-sided carotid artery disease, severe.  Recent CT Angio of the neck revealed critical on the right at 90% with predominant fibrofatty plaque Appreciate vascular surgery involvement, for RCEA within the next 48 hrs Continue heparin per pharmacy. Continue ASA   Hypertension BP  146/83 Pulse 66   Continue home anti-hypertensive medications    Hyperlipidemia total cholesterol on 02/15/2018 is 159, with HDL 36, LDL 87, triglycerides Continue home statins with Lipitor 80 mg daily    DVT prophylaxis: heparin gtt Code Status: Full code   Family Communication: None  Disposition Plan: to home  when clinically stable   Consultants:   Dr. Scot Dock, VVS, for RCEA within the next 48 hrs   Procedures:  None   Antimicrobials:   None    Subjective: Verbalizes no complaints this morning.  She denies any new episodes of dizziness, or new episodes of numbness.  She denies any vertigo, vision changes, or headaches.  No syncope or presyncope.  She denies any dysphagia, or dysarthria.  She denies any chest pain or palpitations.  No nausea or vomiting.  No dysuria or gross hematuria.  No lower extremity swelling or calf pain.  Objective: Vitals:   02/14/18 2336 02/15/18 0358 02/15/18 0708 02/15/18 1146  BP: 129/73 134/74 (!) 146/83   Pulse: 75 62 66   Resp: 14 15 17    Temp: 98.2 F (36.8 C) 98 F (36.7 C) 98.3 F (36.8 C) 98.9 F (37.2 C)  TempSrc: Oral Oral Oral Oral  SpO2: 97% 99% 96%   Weight:      Height:        Intake/Output Summary (Last 24 hours) at 02/15/2018 1354 Last data filed at 02/14/2018 1900 Gross per 24 hour  Intake 390.49 ml  Output -  Net 390.49 ml   Filed Weights   02/14/18 1106 02/14/18 1353  Weight: 56.5 kg 55.8 kg    Examination:  General exam: Appears calm and comfortable  Respiratory system: Clear to auscultation. Respiratory effort normal. Cardiovascular system: S1 & S2 heard, RRR. No JVD, murmurs, rubs, gallops or clicks. No pedal edema. Gastrointestinal system: Abdomen is nondistended, soft and nontender. No organomegaly or masses felt. Normal bowel sounds heard. Central nervous system: Alert and oriented. No  new focal neurological deficits.  Known left-sided weakness, with left upper extremity more than the left lower extremity.  Normal strength on the right. Extremities: Symmetric 5 x 5 power. Skin: No rashes, lesions or ulcers Psychiatry: Judgement and insight appear normal. Mood & affect appropriate.     Data Reviewed: I have personally reviewed following labs and imaging studies  CBC: Recent Labs  Lab 02/14/18 1032  02/15/18 0321  WBC 7.1 4.9  HGB 13.1 13.2  HCT 41.9 41.8  MCV 90.9 90.1  PLT 265 244   Basic Metabolic Panel: Recent Labs  Lab 02/14/18 1032  NA 141  K 3.6  CL 109  CO2 24  GLUCOSE 119*  BUN 5*  CREATININE 0.68  CALCIUM 8.9   GFR: Estimated Creatinine Clearance: 75.8 mL/min (by C-G formula based on SCr of 0.68 mg/dL). Liver Function Tests: Recent Labs  Lab 02/14/18 1032  AST 14*  ALT 8  ALKPHOS 78  BILITOT 0.5  PROT 5.9*  ALBUMIN 3.3*   No results for input(s): LIPASE, AMYLASE in the last 168 hours. No results for input(s): AMMONIA in the last 168 hours. Coagulation Profile: No results for input(s): INR, PROTIME in the last 168 hours. Cardiac Enzymes: No results for input(s): CKTOTAL, CKMB, CKMBINDEX, TROPONINI in the last 168 hours. BNP (last 3 results) No results for input(s): PROBNP in the last 8760 hours. HbA1C: Recent Labs    02/15/18 0321  HGBA1C 5.1   CBG: No results for input(s): GLUCAP in the last 168 hours. Lipid Profile: Recent Labs    02/15/18 0321  CHOL 159  HDL 36*  LDLCALC 87  TRIG 182*  CHOLHDL 4.4   Thyroid Function Tests: No results for input(s): TSH, T4TOTAL, FREET4, T3FREE, THYROIDAB in the last 72 hours. Anemia Panel: No results for input(s): VITAMINB12, FOLATE, FERRITIN, TIBC, IRON, RETICCTPCT in the last 72 hours. Sepsis Labs: No results for input(s): PROCALCITON, LATICACIDVEN in the last 168 hours.  No results found for this or any previous visit (from the past 240 hour(s)).       Radiology Studies: Mr Brain 1 Contrast  Result Date: 02/14/2018 CLINICAL DATA:  New onset of left-sided numbness and dizziness beginning yesterday. EXAM: MRI HEAD WITHOUT CONTRAST TECHNIQUE: Multiplanar, multiecho pulse sequences of the brain and surrounding structures were obtained without intravenous contrast. COMPARISON:  CT head without contrast 02/13/2018. MRI brain 01/19/2018 FINDINGS: Brain: The remote right basal ganglia infarct is  stable. No acute infarct, hemorrhage, or mass lesion is present. Mild white matter disease otherwise is stable. There is stable ex vacuo dilation of the right lateral ventricle. The internal auditory canals are within normal limits bilaterally. The brainstem and cerebellum are normal. Chronic wallerian degeneration is evident in the brainstem. Vascular: Flow is present in the major intracranial arteries. Skull and upper cervical spine: The skull base is within normal limits. The craniocervical junction is normal. The upper cervical spine is unremarkable. Sinuses/Orbits: The paranasal sinuses and mastoid air cells are clear. Globes and orbits are within normal limits. IMPRESSION: 1. Remote right basal ganglia infarct with associated ex vacuo dilation of the right lateral ventricle is stable. 2. No acute intracranial abnormalities. Electronically Signed   By: San Morelle M.D.   On: 02/14/2018 13:54        Scheduled Meds: . aspirin EC  81 mg Oral Daily  . atorvastatin  80 mg Oral q1800  . nicotine  7 mg Transdermal Daily  . pantoprazole  40 mg Oral Q0600   Continuous Infusions: .  heparin 700 Units/hr (02/14/18 1438)     LOS: 1 day       Sharene Butters, MD [facility] Pager [phone]  If 7PM-7AM, please contact night-coverage [email or web address] Password [email or web address] 02/15/2018, 1:54 PM

## 2018-02-15 NOTE — Progress Notes (Signed)
ANTICOAGULATION CONSULT NOTE - follow-up Consult  Pharmacy Consult for heparin Indication: r/o CVA; TIA with prior history of stroke with residual left-sided weakness  Allergies  Allergen Reactions  . Ivp Dye [Iodinated Diagnostic Agents] Shortness Of Breath and Rash  . Codeine Nausea And Vomiting  . Shrimp [Shellfish Allergy] Nausea And Vomiting  . Influenza Vaccine Live Rash  . Morphine And Related Rash  . Pneumococcal Vaccines Rash    Patient Measurements: Height: 5\' 7"  (170.2 cm) Weight: 123 lb (55.8 kg) IBW/kg (Calculated) : 61.6 Heparin Dosing Weight: 56.5 kg  Vital Signs: Temp: 98.9 F (37.2 C) (09/08 1146) Temp Source: Oral (09/08 1146) BP: 146/83 (09/08 0708) Pulse Rate: 66 (09/08 0708)  Labs: Recent Labs    02/14/18 1032 02/14/18 2025 02/15/18 0321  HGB 13.1  --  13.2  HCT 41.9  --  41.8  PLT 265  --  263  HEPARINUNFRC  --  0.43  --   CREATININE 0.68  --   --     Estimated Creatinine Clearance: 75.8 mL/min (by C-G formula based on SCr of 0.68 mg/dL).   Medical History: Past Medical History:  Diagnosis Date  . Disorder of gallbladder 2000   pt states gallbladder was removed  . Essential hypertension   . HLD (hyperlipidemia)   . S/P partial hysterectomy 2004   "whole right side removed"  . Stroke ([place]) 05/10/2014  . Tobacco abuse   . Tubal ligation status 2000   "tied and bunt"    Assessment: 69 yoF with PMH significant for prior CVA with residual left-sided weakness, hypertension, hyperlipidemia, tobacco abuse, who presented with new transient left-sided weakness. Pharmacy has been consulted for heparin dosing.  Initial heparin level 0.43 last night, but we missed getting the confirmatory level this morning. Will reorder confirmatory heparin level for this afternoon to determine if infusion rate is stable enough for once daily heparin levels. No documented bleeding and no issues with the infusion per RN. CBC stable.   Goal of Therapy:   Heparin level 0.3-0.5 units/ml Monitor platelets by anticoagulation protocol: Yes   Plan:  Continue heparin drip at 700 units/hr Confirmatory heparin level in 6 hours Daily HL and CBC while on heparin Monitor for s/sx bleeding   Brendolyn Patty, PharmD PGY1 Pharmacy Resident Phone [phone]  02/15/2018   1:21 PM

## 2018-02-15 NOTE — Evaluation (Signed)
Physical Therapy Evaluation Patient Details Name: Jill Williams MRN: [id number] DOB: 1970/06/19 Today's Date: 02/15/2018   History of Present Illness  47 y.o. female with medical history significant of prior CVA with residual left-sided weakness, hypertension, hyperlipidemia, tobacco abuse, who presents to the hospital at [facility] with left-sided weakness and numbness along with dizziness. Her symptoms have since resolved, and she does have left-sided weakness which is residual from her prior CVA. She had bilateral severe carotid stenoses on her CT angiogram.  Her MRI yesterday showed no acute findings with a remote right basal ganglia infarct.  Plan is for right carotid endarterectomy.  Clinical Impression  Orders received for PT evaluation. Patient demonstrates baseline deficits in functional mobility as indicated below but has developed compensatory strategies to maintain independent mobility. Patient ambulating increased distances without difficulty and was able to demonstrate HEP. Encouraged patient to remain mobile throughout hospital course. No further acute PT needs, will sign off.     Follow Up Recommendations No PT follow up    Equipment Recommendations  None recommended by PT    Recommendations for Other Services       Precautions / Restrictions Precautions Precautions: Fall Restrictions Weight Bearing Restrictions: No      Mobility  Bed Mobility Overal bed mobility: Modified Independent                Transfers Overall transfer level: Modified independent                  Ambulation/Gait Ambulation/Gait assistance: Modified independent (Device/Increase time) Gait Distance (Feet): 210 Feet Assistive device: None Gait Pattern/deviations: Decreased step length - left;Decreased dorsiflexion - left;Trunk flexed;Trendelenburg Gait velocity: decreased   General Gait Details: noted gait deviation with increased sway and LLE compensations (AFO on)  circumduction noted. No physical assist required  Stairs Stairs: Yes Stairs assistance: Modified independent (Device/Increase time) Stair Management: One rail Right Number of Stairs: 5 General stair comments: no difficulty with performance  Wheelchair Mobility    Modified Rankin (Stroke Patients Only) Modified Rankin (Stroke Patients Only) Pre-Morbid Rankin Score: Moderate disability Modified Rankin: Moderate disability     Balance Overall balance assessment: Needs assistance Sitting-balance support: Feet supported Sitting balance-Leahy Scale: Good     Standing balance support: During functional activity Standing balance-Leahy Scale: Fair Standing balance comment: no physical assist required                             Pertinent Vitals/Pain Pain Assessment: No/denies pain    Home Living Family/patient expects to be discharged to:: Private residence Living Arrangements: Non-relatives/Friends Available Help at Discharge: Friend(s);Available 24 hours/day Type of Home: House Home Access: Stairs to enter Entrance Stairs-Rails: Can reach both;Right;Left Entrance Stairs-Number of Steps: 2 Home Layout: One level Home Equipment: Walker - standard Additional Comments: has a RW but reports not using in several years    Prior Function Level of Independence: Independent with assistive device(s)   Gait / Transfers Assistance Needed: mod I per pt report  ADL's / Homemaking Assistance Needed: mod per pt report        Hand Dominance   Dominant Hand: Right    Extremity/Trunk Assessment   Upper Extremity Assessment Upper Extremity Assessment: LUE deficits/detail LUE Deficits / Details: Pt reports no change in baseline residual weakness in LUE.    Lower Extremity Assessment Lower Extremity Assessment: LLE deficits/detail LLE Deficits / Details: history of LLE deficits, wears AFO for ambulatory activities  LLE Sensation: decreased light touch;decreased  proprioception LLE Coordination: decreased gross motor;decreased fine motor       Communication   Communication: No difficulties  Cognition Arousal/Alertness: Awake/alert Behavior During Therapy: WFL for tasks assessed/performed Overall Cognitive Status: Within Functional Limits for tasks assessed                                        General Comments      Exercises     Assessment/Plan    PT Assessment Patent does not need any further PT services(patient declines further needs, reports mobility baseline)  PT Problem List         PT Treatment Interventions      PT Goals (Current goals can be found in the Care Plan section)  Acute Rehab PT Goals Patient Stated Goal: get better for my 3 triplet grandbabies that are on the way PT Goal Formulation: All assessment and education complete, DC therapy    Frequency     Barriers to discharge        Co-evaluation               AM-PAC PT "6 Clicks" Daily Activity  Outcome Measure Difficulty turning over in bed (including adjusting bedclothes, sheets and blankets)?: None Difficulty moving from lying on back to sitting on the side of the bed? : None Difficulty sitting down on and standing up from a chair with arms (e.g., wheelchair, bedside commode, etc,.)?: A Little Help needed moving to and from a bed to chair (including a wheelchair)?: A Little Help needed walking in hospital room?: A Little Help needed climbing 3-5 steps with a railing? : A Little 6 Click Score: 20    End of Session Equipment Utilized During Treatment: Gait belt Activity Tolerance: Patient tolerated treatment well Patient left: in bed;with call bell/phone within reach;with bed alarm set Nurse Communication: Mobility status      Time: 6237-6283 PT Time Calculation (min) (ACUTE ONLY): 15 min   Charges:   PT Evaluation $PT Eval Moderate Complexity: [place], PT DPT  Board Certified Neurologic  Specialist [place] 02/15/2018, 9:43 AM

## 2018-02-16 LAB — CBC
HEMATOCRIT: 41.1 % (ref 36.0–46.0)
HEMOGLOBIN: 13.1 g/dL (ref 12.0–15.0)
MCH: 28.7 pg (ref 26.0–34.0)
MCHC: 31.9 g/dL (ref 30.0–36.0)
MCV: 90.1 fL (ref 78.0–100.0)
Platelets: 258 10*3/uL (ref 150–400)
RBC: 4.56 MIL/uL (ref 3.87–5.11)
RDW: 13.8 % (ref 11.5–15.5)
WBC: 5.3 10*3/uL (ref 4.0–10.5)

## 2018-02-16 LAB — HEPARIN LEVEL (UNFRACTIONATED)
HEPARIN UNFRACTIONATED: 0.22 [IU]/mL — AB (ref 0.30–0.70)
HEPARIN UNFRACTIONATED: 0.3 [IU]/mL (ref 0.30–0.70)

## 2018-02-16 MED ORDER — HEPARIN (PORCINE) IN NACL 100-0.45 UNIT/ML-% IJ SOLN
1000.0000 [IU]/h | INTRAMUSCULAR | Status: DC
  Administered 2018-02-16 – 2018-02-17 (×2): 1000 [IU]/h via INTRAVENOUS
  Filled 2018-02-16: qty 250

## 2018-02-16 MED ORDER — CEFAZOLIN SODIUM-DEXTROSE 1-4 GM/50ML-% IV SOLN
1.0000 g | INTRAVENOUS | Status: AC
  Filled 2018-02-16: qty 50

## 2018-02-16 MED ORDER — HEPARIN (PORCINE) IN NACL 100-0.45 UNIT/ML-% IJ SOLN: 1000.0000 [IU]/h | INTRAMUSCULAR | Status: DC

## 2018-02-16 MED ORDER — ZOLPIDEM TARTRATE 5 MG PO TABS
5.0000 mg | ORAL_TABLET | Freq: Every evening | ORAL | Status: DC | PRN
  Administered 2018-02-16: 5 mg via ORAL
  Filled 2018-02-16 (×2): qty 1

## 2018-02-16 NOTE — Progress Notes (Signed)
PROGRESS NOTE    Jill Williams  TUU:[id number] DOB: 17-Oct-1970 DOA: 02/14/2018 PCP: Maris Berger, MD   Brief Narrative: Patient is a 47 year old female with past medical history of stroke with residual left-sided weakness, tobacco abuse, hyperlipidemia who presented to the emergency department with transient left-sided weakness and numbness, dizziness.  Work-up including a CT of the head, MRI of the brain, MRA was negative for new stroke.  Recent CT angiogram of the neck had shown critical carotid artery stenosis on the right at 90%.  Vascular surgery has been consulted.  She is being planned for right carotid endarterectomy tomorrow.  Currently on heparin.  Assessment & Plan:   Principal Problem:   Right-sided carotid artery disease ([place]) Active Problems:   HLD (hyperlipidemia)   Tobacco abuse   TIA (transient ischemic attack)   History of stroke with residual deficit  TIA: Most likely her symptoms are associated with TIA.  She does not have any new focal neurological deficits.  She has a history of a stroke and has residual left-sided weakness more on the left upper extremity.  MRI of the brain did not show any acute stroke.  Her symptoms might be associated with her severe right carotid artery disease.  Continue aspirin.  Continue Lipitor.  Continue heparin.  Right-sided carotid artery disease: Recent CT angio of the neck revealed critical right carotid artery stenosis at 90% with predominant fibrofatty plaque.  Vascular surgery following and planning for right carotid endarterectomy tomorrow.  Hypertension: Currently blood pressure stable.    History of hyperlipidemia: Continue Lipitor 80 mg daily.   DVT prophylaxis: heparin IV Code Status: Full Family Communication: None present at the bedside Disposition Plan: Home after carotid endarterectomy   Consultants: Vascular surgery  Procedures: None  Antimicrobials: None  Subjective: Patient seen and examined the  bedside this morning.  Remains hemodynamically stable.  Denies any new complaints.  Comfortable.  New transient weakness on the left side has resolved.  Objective: Vitals:   02/15/18 2356 02/16/18 0316 02/16/18 0805 02/16/18 1200  BP: 128/87 (!) 151/97 135/80 112/70  Pulse: 66 77 71 85  Resp: (!) 24 12 15 16   Temp:  98 F (36.7 C)  98.2 F (36.8 C)  TempSrc:  Oral  Oral  SpO2: 91% 100% 98% 99%  Weight:      Height:       No intake or output data in the 24 hours ending 02/16/18 1435 Filed Weights   02/14/18 1106 02/14/18 1353  Weight: 56.5 kg 55.8 kg    Examination:  General exam: Appears calm and comfortable ,Not in distress,average built HEENT:PERRL,Oral mucosa moist, Ear/Nose normal on gross exam Respiratory system: Bilateral equal air entry, normal vesicular breath sounds, no wheezes or crackles  Cardiovascular system: S1 & S2 heard, RRR. No JVD, murmurs, rubs, gallops or clicks. No pedal edema. Gastrointestinal system: Abdomen is nondistended, soft and nontender. No organomegaly or masses felt. Normal bowel sounds heard. Central nervous system: Alert and oriented.  Left-sided residual weakness more on the  left upper extremity. Extremities: No edema, no clubbing ,no cyanosis, distal peripheral pulses palpable. Skin: No rashes, lesions or ulcers,no icterus ,no pallor Psychiatry: Judgement and insight appear normal. Mood & affect appropriate.     Data Reviewed: I have personally reviewed following labs and imaging studies  CBC: Recent Labs  Lab 02/14/18 1032 02/15/18 0321 02/16/18 0531  WBC 7.1 4.9 5.3  HGB 13.1 13.2 13.1  HCT 41.9 41.8 41.1  MCV 90.9 90.1  90.1  PLT 265 263 915   Basic Metabolic Panel: Recent Labs  Lab 02/14/18 1032  NA 141  K 3.6  CL 109  CO2 24  GLUCOSE 119*  BUN 5*  CREATININE 0.68  CALCIUM 8.9   GFR: Estimated Creatinine Clearance: 75.8 mL/min (by C-G formula based on SCr of 0.68 mg/dL). Liver Function Tests: Recent Labs  Lab  02/14/18 1032  AST 14*  ALT 8  ALKPHOS 78  BILITOT 0.5  PROT 5.9*  ALBUMIN 3.3*   No results for input(s): LIPASE, AMYLASE in the last 168 hours. No results for input(s): AMMONIA in the last 168 hours. Coagulation Profile: No results for input(s): INR, PROTIME in the last 168 hours. Cardiac Enzymes: No results for input(s): CKTOTAL, CKMB, CKMBINDEX, TROPONINI in the last 168 hours. BNP (last 3 results) No results for input(s): PROBNP in the last 8760 hours. HbA1C: Recent Labs    02/15/18 0321  HGBA1C 5.1   CBG: No results for input(s): GLUCAP in the last 168 hours. Lipid Profile: Recent Labs    02/15/18 0321  CHOL 159  HDL 36*  LDLCALC 87  TRIG 182*  CHOLHDL 4.4   Thyroid Function Tests: No results for input(s): TSH, T4TOTAL, FREET4, T3FREE, THYROIDAB in the last 72 hours. Anemia Panel: No results for input(s): VITAMINB12, FOLATE, FERRITIN, TIBC, IRON, RETICCTPCT in the last 72 hours. Sepsis Labs: No results for input(s): PROCALCITON, LATICACIDVEN in the last 168 hours.  No results found for this or any previous visit (from the past 240 hour(s)).       Radiology Studies: No results found.      Scheduled Meds: . aspirin EC  81 mg Oral Daily  . atorvastatin  80 mg Oral q1800  . nicotine  7 mg Transdermal Daily  . pantoprazole  40 mg Oral Q0600   Continuous Infusions: . [START ON 02/17/2018]  ceFAZolin (ANCEF) IV    . heparin 1,000 Units/hr (02/16/18 1001)     LOS: 2 days    Time spent: 25 mins.More than 50% of that time was spent in counseling and/or coordination of care.      Shelly Coss, MD [facility] Pager [phone]  If 7PM-7AM, please contact night-coverage [email or web address] Password [email or web address] 02/16/2018, 2:35 PM

## 2018-02-16 NOTE — Progress Notes (Signed)
ANTICOAGULATION CONSULT NOTE - follow-up Consult  Pharmacy Consult for heparin Indication: r/o CVA; TIA with prior history of stroke with residual left-sided weakness  Allergies  Allergen Reactions  . Ivp Dye [Iodinated Diagnostic Agents] Shortness Of Breath and Rash  . Codeine Nausea And Vomiting  . Shrimp [Shellfish Allergy] Nausea And Vomiting  . Influenza Vaccine Live Rash  . Morphine And Related Rash  . Pneumococcal Vaccines Rash    Patient Measurements: Height: 5\' 7"  (170.2 cm) Weight: 123 lb (55.8 kg) IBW/kg (Calculated) : 61.6 Heparin Dosing Weight: 56.5 kg  Vital Signs: Temp: 98.1 F (36.7 C) (09/08 2310) Temp Source: Oral (09/08 2310) BP: 141/83 (09/08 2310) Pulse Rate: 69 (09/08 2310)  Labs: Recent Labs    02/14/18 1032 02/14/18 2025 02/15/18 0321 02/15/18 1533 02/15/18 2247  HGB 13.1  --  13.2  --   --   HCT 41.9  --  41.8  --   --   PLT 265  --  263  --   --   HEPARINUNFRC  --  0.43  --  0.10* 0.22*  CREATININE 0.68  --   --   --   --     Estimated Creatinine Clearance: 75.8 mL/min (by C-G formula based on SCr of 0.68 mg/dL).   Medical History: Past Medical History:  Diagnosis Date  . Disorder of gallbladder 2000   pt states gallbladder was removed  . Essential hypertension   . HLD (hyperlipidemia)   . S/P partial hysterectomy 2004   "whole right side removed"  . Stroke ([place]) 05/10/2014  . Tobacco abuse   . Tubal ligation status 2000   "tied and bunt"    Assessment: 89 yoF with PMH significant for prior CVA with residual left-sided weakness, hypertension, hyperlipidemia, tobacco abuse, who presented with new transient left-sided weakness. Pharmacy has been consulted for heparin dosing.  Heparin level 0.22. No infusion problems with heparin drip per RN. Will increase rate with no bolus due to CVA  Goal of Therapy:  Heparin level 0.3-0.5 units/ml Monitor platelets by anticoagulation protocol: Yes   Plan:  Increase heparin drip to 1000  units/hr Daily HL and CBC while on heparin Monitor for s/sx bleeding Thanks for allowing pharmacy to be a part of this patient's care.  Excell Seltzer, PharmD Clinical Pharmacist

## 2018-02-16 NOTE — Progress Notes (Signed)
   VASCULAR SURGERY ASSESSMENT & PLAN:   BILATERAL GREATER THAN 80% CAROTID STENOSES: This patient has greater than 80% bilateral carotid stenoses.  The right side appears to be symptomatic and I have recommended right carotid endarterectomy in order to lower her risk of future stroke.  She has had a previous stroke on that side and has some residual left arm weakness from this.  She will also need to be considered for staged left carotid endarterectomy.  This patient was originally seen by Dr. Curt Jews but he is out of town and therefore we are trying to schedule her surgery for Tuesday given that she has had repeated right hemispheric symptoms.  I have reviewed her CT angiogram that was done at [facility] and the stenosis on the right does appear to be surgically accessible and fairly focal.  She is on aspirin and is on a statin.  Dr. Servando Snare has agreed to proceed with her surgery tomorrow.  I will stop her heparin tomorrow morning.  Preop orders have been written.  SUBJECTIVE:   No complaints this morning except that she has not had any sleep as people have been waking her up all night.  PHYSICAL EXAM:   Vitals:   02/15/18 1958 02/15/18 2310 02/15/18 2356 02/16/18 0316  BP: 140/90 (!) 141/83 128/87 (!) 151/97  Pulse: 97 69 66 77  Resp: (!) 33 17 (!) 24 12  Temp: 98.2 F (36.8 C) 98.1 F (36.7 C)  98 F (36.7 C)  TempSrc: Oral Oral  Oral  SpO2: 100% 100% 91% 100%  Weight:      Height:       Persistent left upper extremity weakness which is chronic.  No new neurologic findings.  LABS:   Lab Results  Component Value Date   WBC 5.3 02/16/2018   HGB 13.1 02/16/2018   HCT 41.1 02/16/2018   MCV 90.1 02/16/2018   PLT 258 02/16/2018   Lab Results  Component Value Date   CREATININE 0.68 02/14/2018   Lab Results  Component Value Date   INR 1.03 01/18/2018    PROBLEM LIST:    Principal Problem:   Right-sided carotid artery disease (HCC) Active Problems:  HLD (hyperlipidemia)   Tobacco abuse   TIA (transient ischemic attack)   History of stroke with residual deficit   CURRENT MEDS:   . aspirin EC  81 mg Oral Daily  . atorvastatin  80 mg Oral q1800  . nicotine  7 mg Transdermal Daily  . pantoprazole  40 mg Oral Q0600    Deitra Mayo Beeper: [phone] Office: [phone] 02/16/2018

## 2018-02-16 NOTE — Care Management Note (Signed)
Case Management Note  Patient Details  Name: Caron Ode MRN: [id number] Date of Birth: 08-31-1970  Subjective/Objective:    Pt admitted with CVA. He is from home with friend. Plan is for CEA on Tuesday.                 Action/Plan: No f/u per PT/OT and no DME needs. Awaiting needs post surgery. CM following.  Expected Discharge Date:                  Expected Discharge Plan:  Home/Self Care  In-House Referral:     Discharge planning Services     Post Acute Care Choice:    Choice offered to:     DME Arranged:    DME Agency:     HH Arranged:    HH Agency:     Status of Service:  In process, will continue to follow  If discussed at Long Length of Stay Meetings, dates discussed:    Additional Comments:  Pollie Friar, RN 02/16/2018, 11:54 AM

## 2018-02-16 NOTE — Progress Notes (Signed)
ANTICOAGULATION CONSULT NOTE - Follow Up Consult  Pharmacy Consult for Heparin Indication: carotid stenosis with CVA  Allergies  Allergen Reactions  . Ivp Dye [Iodinated Diagnostic Agents] Shortness Of Breath and Rash  . Codeine Nausea And Vomiting  . Shrimp [Shellfish Allergy] Nausea And Vomiting  . Influenza Vaccine Live Rash  . Morphine And Related Rash  . Pneumococcal Vaccines Rash    Patient Measurements: Height: 5\' 7"  (170.2 cm) Weight: 123 lb (55.8 kg) IBW/kg (Calculated) : 61.6 Heparin Dosing Weight:  55.8 kg  Vital Signs: Temp: 98 F (36.7 C) (09/09 0316) Temp Source: Oral (09/09 0316) BP: 151/97 (09/09 0316) Pulse Rate: 77 (09/09 0316)  Labs: Recent Labs    02/14/18 1032  02/15/18 0321 02/15/18 1533 02/15/18 2247 02/16/18 0531  HGB 13.1  --  13.2  --   --  13.1  HCT 41.9  --  41.8  --   --  41.1  PLT 265  --  263  --   --  258  HEPARINUNFRC  --    < >  --  0.10* 0.22* 0.30  CREATININE 0.68  --   --   --   --   --    < > = values in this interval not displayed.    Estimated Creatinine Clearance: 75.8 mL/min (by C-G formula based on SCr of 0.68 mg/dL).   Assessment: Anticoag: UFH gtt for r/o CVA. HL 0.3. CBC stable and WNL. Noted >80% carotid stenosis; Plan R CEA 9/10.  Goal of Therapy:  Heparin level 0.3-0.5 units/ml Monitor platelets by anticoagulation protocol: Yes   Plan:  Continue  heparin 1000 units/hr- stop 0800 in AM Daily HL and CBC, monitor s/sx bleeding   Jill Williams S. Alford Highland, PharmD, BCPS Clinical Staff Pharmacist [phone]  Jill Williams 02/16/2018,8:42 AM

## 2018-02-17 ENCOUNTER — Other Ambulatory Visit: Payer: Self-pay

## 2018-02-17 ENCOUNTER — Encounter (HOSPITAL_COMMUNITY)
Admission: AD | Disposition: A | Discharge status: Home / Self Care | Payer: Self-pay | Source: Other Acute Inpatient Hospital | Attending: Internal Medicine

## 2018-02-17 ENCOUNTER — Encounter (HOSPITAL_COMMUNITY): Payer: Self-pay

## 2018-02-17 ENCOUNTER — Inpatient Hospital Stay (HOSPITAL_COMMUNITY): Payer: Medicaid Other | Admitting: Certified Registered"

## 2018-02-17 HISTORY — PX: ENDARTERECTOMY: SHX5162

## 2018-02-17 LAB — CBC
HCT: 41.3 % (ref 36.0–46.0)
Hemoglobin: 13.1 g/dL (ref 12.0–15.0)
MCH: 28.5 pg (ref 26.0–34.0)
MCHC: 31.7 g/dL (ref 30.0–36.0)
MCV: 90 fL (ref 78.0–100.0)
PLATELETS: 247 10*3/uL (ref 150–400)
RBC: 4.59 MIL/uL (ref 3.87–5.11)
RDW: 13.7 % (ref 11.5–15.5)
WBC: 5.8 10*3/uL (ref 4.0–10.5)

## 2018-02-17 LAB — HEPARIN LEVEL (UNFRACTIONATED): Heparin Unfractionated: <0.1 IU/mL — ABNORMAL LOW (ref 0.30–0.70)

## 2018-02-17 LAB — POCT ACTIVATED CLOTTING TIME: Activated Clotting Time: 230 seconds

## 2018-02-17 LAB — SURGICAL PCR SCREEN
MRSA, PCR: NEGATIVE
Staphylococcus aureus: NEGATIVE

## 2018-02-17 SURGERY — ENDARTERECTOMY, CAROTID
Anesthesia: General | Site: Neck | Laterality: Right

## 2018-02-17 MED ORDER — CEFAZOLIN SODIUM-DEXTROSE 1-4 GM/50ML-% IV SOLN
INTRAVENOUS | Status: DC | PRN
  Administered 2018-02-17 (×2): 1 g via INTRAVENOUS

## 2018-02-17 MED ORDER — HEPARIN SODIUM (PORCINE) 1000 UNIT/ML IJ SOLN
INTRAMUSCULAR | Status: AC
  Filled 2018-02-17: qty 1

## 2018-02-17 MED ORDER — HEPARIN SODIUM (PORCINE) 1000 UNIT/ML IJ SOLN
INTRAMUSCULAR | Status: DC | PRN
  Administered 2018-02-17: 2000 [IU] via INTRAVENOUS
  Administered 2018-02-17: 6000 [IU] via INTRAVENOUS

## 2018-02-17 MED ORDER — LIDOCAINE 2% (20 MG/ML) 5 ML SYRINGE
INTRAMUSCULAR | Status: DC | PRN
  Administered 2018-02-17: 40 mg via INTRAVENOUS

## 2018-02-17 MED ORDER — CEFAZOLIN SODIUM-DEXTROSE 1-4 GM/50ML-% IV SOLN
INTRAVENOUS | Status: AC
  Filled 2018-02-17: qty 50

## 2018-02-17 MED ORDER — DOCUSATE SODIUM 100 MG PO CAPS
100.0000 mg | ORAL_CAPSULE | Freq: Every day | ORAL | Status: DC
  Administered 2018-02-18: 100 mg via ORAL
  Filled 2018-02-17: qty 1

## 2018-02-17 MED ORDER — LACTATED RINGERS IV SOLN: INTRAVENOUS | Status: DC

## 2018-02-17 MED ORDER — SUFENTANIL CITRATE 50 MCG/ML IV SOLN
INTRAVENOUS | Status: DC | PRN
  Administered 2018-02-17: 15 ug via INTRAVENOUS
  Administered 2018-02-17: 10 ug via INTRAVENOUS

## 2018-02-17 MED ORDER — DEXAMETHASONE SODIUM PHOSPHATE 10 MG/ML IJ SOLN
INTRAMUSCULAR | Status: AC
  Filled 2018-02-17: qty 1

## 2018-02-17 MED ORDER — PHENOL 1.4 % MT LIQD: 1.0000 | OROMUCOSAL | Status: DC | PRN

## 2018-02-17 MED ORDER — LIDOCAINE 2% (20 MG/ML) 5 ML SYRINGE
INTRAMUSCULAR | Status: AC
  Filled 2018-02-17: qty 5

## 2018-02-17 MED ORDER — ALUM & MAG HYDROXIDE-SIMETH 200-200-20 MG/5ML PO SUSP: 15.0000 mL | ORAL | Status: DC | PRN

## 2018-02-17 MED ORDER — PROPOFOL 10 MG/ML IV BOLUS
INTRAVENOUS | Status: AC
  Filled 2018-02-17: qty 20

## 2018-02-17 MED ORDER — SODIUM CHLORIDE 0.9 % IV SOLN
INTRAVENOUS | Status: AC
  Filled 2018-02-17: qty 1.2

## 2018-02-17 MED ORDER — SODIUM CHLORIDE 0.9 % IV SOLN
INTRAVENOUS | Status: DC | PRN
  Administered 2018-02-17: 500 mL

## 2018-02-17 MED ORDER — POTASSIUM CHLORIDE CRYS ER 20 MEQ PO TBCR: 20.0000 meq | EXTENDED_RELEASE_TABLET | Freq: Every day | ORAL | Status: DC | PRN

## 2018-02-17 MED ORDER — TRAMADOL HCL 50 MG PO TABS: 50.0000 mg | ORAL_TABLET | Freq: Four times a day (QID) | ORAL | Status: DC | PRN

## 2018-02-17 MED ORDER — DEXAMETHASONE SODIUM PHOSPHATE 10 MG/ML IJ SOLN
INTRAMUSCULAR | Status: DC | PRN
  Administered 2018-02-17: 5 mg via INTRAVENOUS

## 2018-02-17 MED ORDER — HYDRALAZINE HCL 20 MG/ML IJ SOLN
5.0000 mg | INTRAMUSCULAR | Status: DC | PRN
  Administered 2018-02-17: 5 mg via INTRAVENOUS

## 2018-02-17 MED ORDER — MAGNESIUM SULFATE 2 GM/50ML IV SOLN: 2.0000 g | Freq: Every day | INTRAVENOUS | Status: DC | PRN

## 2018-02-17 MED ORDER — PROTAMINE SULFATE 10 MG/ML IV SOLN
INTRAVENOUS | Status: AC
  Filled 2018-02-17: qty 5

## 2018-02-17 MED ORDER — SODIUM CHLORIDE 0.9 % IV SOLN
INTRAVENOUS | Status: DC | PRN
  Administered 2018-02-17: 80 ug/min via INTRAVENOUS

## 2018-02-17 MED ORDER — LABETALOL HCL 5 MG/ML IV SOLN
10.0000 mg | INTRAVENOUS | Status: DC | PRN
  Administered 2018-02-17 (×3): 10 mg via INTRAVENOUS

## 2018-02-17 MED ORDER — HYDROMORPHONE HCL 1 MG/ML IJ SOLN: 0.5000 mg | INTRAMUSCULAR | Status: DC | PRN

## 2018-02-17 MED ORDER — MEPERIDINE HCL 50 MG/ML IJ SOLN: 6.2500 mg | INTRAMUSCULAR | Status: DC | PRN

## 2018-02-17 MED ORDER — PROPOFOL 10 MG/ML IV BOLUS
INTRAVENOUS | Status: DC | PRN
  Administered 2018-02-17: 60 mg via INTRAVENOUS
  Administered 2018-02-17: 40 mg via INTRAVENOUS
  Administered 2018-02-17: 100 mg via INTRAVENOUS

## 2018-02-17 MED ORDER — PROTAMINE SULFATE 10 MG/ML IV SOLN
INTRAVENOUS | Status: DC | PRN
  Administered 2018-02-17: 50 mg via INTRAVENOUS

## 2018-02-17 MED ORDER — 0.9 % SODIUM CHLORIDE (POUR BTL) OPTIME
TOPICAL | Status: DC | PRN
  Administered 2018-02-17: 2000 mL

## 2018-02-17 MED ORDER — ONDANSETRON HCL 4 MG/2ML IJ SOLN
INTRAMUSCULAR | Status: DC | PRN
  Administered 2018-02-17: 4 mg via INTRAVENOUS

## 2018-02-17 MED ORDER — ONDANSETRON HCL 4 MG/2ML IJ SOLN
INTRAMUSCULAR | Status: AC
  Filled 2018-02-17: qty 2

## 2018-02-17 MED ORDER — HYDRALAZINE HCL 20 MG/ML IJ SOLN
INTRAMUSCULAR | Status: AC
  Filled 2018-02-17: qty 1

## 2018-02-17 MED ORDER — METOPROLOL TARTRATE 5 MG/5ML IV SOLN: 2.0000 mg | INTRAVENOUS | Status: DC | PRN

## 2018-02-17 MED ORDER — ROCURONIUM BROMIDE 10 MG/ML (PF) SYRINGE
PREFILLED_SYRINGE | INTRAVENOUS | Status: DC | PRN
  Administered 2018-02-17: 50 mg via INTRAVENOUS

## 2018-02-17 MED ORDER — HYDROMORPHONE HCL 1 MG/ML IJ SOLN: 0.2500 mg | INTRAMUSCULAR | Status: DC | PRN

## 2018-02-17 MED ORDER — SODIUM CHLORIDE 0.9 % IV SOLN: 500.0000 mL | Freq: Once | INTRAVENOUS | Status: DC | PRN

## 2018-02-17 MED ORDER — PHENYLEPHRINE 40 MCG/ML (10ML) SYRINGE FOR IV PUSH (FOR BLOOD PRESSURE SUPPORT)
PREFILLED_SYRINGE | INTRAVENOUS | Status: DC | PRN
  Administered 2018-02-17: 80 ug via INTRAVENOUS
  Administered 2018-02-17 (×5): 40 ug via INTRAVENOUS

## 2018-02-17 MED ORDER — HEPARIN SODIUM (PORCINE) 5000 UNIT/ML IJ SOLN
5000.0000 [IU] | Freq: Three times a day (TID) | INTRAMUSCULAR | Status: DC
  Administered 2018-02-18: 5000 [IU] via SUBCUTANEOUS
  Filled 2018-02-17 (×3): qty 1

## 2018-02-17 MED ORDER — SODIUM CHLORIDE 0.9 % IV SOLN
INTRAVENOUS | Status: DC
  Administered 2018-02-17: 18:00:00 via INTRAVENOUS

## 2018-02-17 MED ORDER — LABETALOL HCL 5 MG/ML IV SOLN
INTRAVENOUS | Status: AC
  Filled 2018-02-17: qty 4

## 2018-02-17 MED ORDER — ONDANSETRON HCL 4 MG/2ML IJ SOLN: 4.0000 mg | Freq: Four times a day (QID) | INTRAMUSCULAR | Status: DC | PRN

## 2018-02-17 MED ORDER — ROCURONIUM BROMIDE 50 MG/5ML IV SOSY
PREFILLED_SYRINGE | INTRAVENOUS | Status: AC
  Filled 2018-02-17: qty 5

## 2018-02-17 MED ORDER — SUGAMMADEX SODIUM 200 MG/2ML IV SOLN
INTRAVENOUS | Status: DC | PRN
  Administered 2018-02-17: 100 mg via INTRAVENOUS

## 2018-02-17 MED ORDER — SUFENTANIL CITRATE 50 MCG/ML IV SOLN
INTRAVENOUS | Status: AC
  Filled 2018-02-17: qty 1

## 2018-02-17 MED ORDER — LACTATED RINGERS IV SOLN
INTRAVENOUS | Status: DC
  Administered 2018-02-17: 09:00:00 via INTRAVENOUS

## 2018-02-17 MED ORDER — LIDOCAINE HCL (PF) 1 % IJ SOLN
INTRAMUSCULAR | Status: AC
  Filled 2018-02-17: qty 5

## 2018-02-17 MED ORDER — GUAIFENESIN-DM 100-10 MG/5ML PO SYRP: 15.0000 mL | ORAL_SOLUTION | ORAL | Status: DC | PRN

## 2018-02-17 MED ORDER — CEFAZOLIN SODIUM-DEXTROSE 2-4 GM/100ML-% IV SOLN
2.0000 g | Freq: Three times a day (TID) | INTRAVENOUS | Status: AC
  Administered 2018-02-17 – 2018-02-18 (×2): 2 g via INTRAVENOUS
  Filled 2018-02-17 (×2): qty 100

## 2018-02-17 MED ORDER — PROMETHAZINE HCL 25 MG/ML IJ SOLN: 6.2500 mg | INTRAMUSCULAR | Status: DC | PRN

## 2018-02-17 SURGICAL SUPPLY — 56 items
ADH SKN CLS APL DERMABOND .7 (GAUZE/BANDAGES/DRESSINGS) ×1
ADPR TBG 2 MALE LL ART (MISCELLANEOUS)
CANISTER SUCT 3000ML PPV (MISCELLANEOUS) ×3 IMPLANT
CATH ROBINSON RED A/P 18FR (CATHETERS) ×3 IMPLANT
CLIP VESOCCLUDE MED 24/CT (CLIP) ×3 IMPLANT
CLIP VESOCCLUDE SM WIDE 24/CT (CLIP) ×3 IMPLANT
COVER PROBE W GEL 5X96 (DRAPES) IMPLANT
CRADLE DONUT ADULT HEAD (MISCELLANEOUS) ×3 IMPLANT
DERMABOND ADVANCED (GAUZE/BANDAGES/DRESSINGS) ×2
DERMABOND ADVANCED .7 DNX12 (GAUZE/BANDAGES/DRESSINGS) ×1 IMPLANT
DRAIN CHANNEL 15F RND FF W/TCR (WOUND CARE) IMPLANT
ELECT REM PT RETURN 9FT ADLT (ELECTROSURGICAL) ×3
ELECTRODE REM PT RTRN 9FT ADLT (ELECTROSURGICAL) ×1 IMPLANT
EVACUATOR SILICONE 100CC (DRAIN) IMPLANT
GLOVE BIO SURGEON STRL SZ 6.5 (GLOVE) ×1 IMPLANT
GLOVE BIO SURGEON STRL SZ7.5 (GLOVE) ×5 IMPLANT
GLOVE BIO SURGEONS STRL SZ 6.5 (GLOVE) ×1
GLOVE BIOGEL PI IND STRL 6.5 (GLOVE) IMPLANT
GLOVE BIOGEL PI IND STRL 7.0 (GLOVE) IMPLANT
GLOVE BIOGEL PI INDICATOR 6.5 (GLOVE) ×4
GLOVE BIOGEL PI INDICATOR 7.0 (GLOVE) ×2
GOWN STRL REUS W/ TWL LRG LVL3 (GOWN DISPOSABLE) ×2 IMPLANT
GOWN STRL REUS W/ TWL XL LVL3 (GOWN DISPOSABLE) ×3 IMPLANT
GOWN STRL REUS W/TWL LRG LVL3 (GOWN DISPOSABLE) ×6
GOWN STRL REUS W/TWL XL LVL3 (GOWN DISPOSABLE) ×9
HEMOSTAT SNOW SURGICEL 2X4 (HEMOSTASIS) IMPLANT
INSERT FOGARTY SM (MISCELLANEOUS) ×1 IMPLANT
IV ADAPTER SYR DOUBLE MALE LL (MISCELLANEOUS) IMPLANT
KIT BASIN OR (CUSTOM PROCEDURE TRAY) ×3 IMPLANT
KIT SHUNT ARGYLE CAROTID ART 6 (VASCULAR PRODUCTS) ×2 IMPLANT
KIT TURNOVER KIT B (KITS) ×3 IMPLANT
NDL HYPO 25GX1X1/2 BEV (NEEDLE) IMPLANT
NDL SPNL 20GX3.5 QUINCKE YW (NEEDLE) IMPLANT
NEEDLE HYPO 25GX1X1/2 BEV (NEEDLE) IMPLANT
NEEDLE SPNL 20GX3.5 QUINCKE YW (NEEDLE) IMPLANT
NS IRRIG 1000ML POUR BTL (IV SOLUTION) ×9 IMPLANT
PACK CAROTID (CUSTOM PROCEDURE TRAY) ×3 IMPLANT
PAD ARMBOARD 7.5X6 YLW CONV (MISCELLANEOUS) ×6 IMPLANT
PATCH VASC XENOSURE 1CMX6CM (Vascular Products) ×3 IMPLANT
PATCH VASC XENOSURE 1X6 (Vascular Products) IMPLANT
SHUNT CAROTID BYPASS 10 (VASCULAR PRODUCTS) IMPLANT
SHUNT CAROTID BYPASS 12FRX15.5 (VASCULAR PRODUCTS) IMPLANT
STOPCOCK 4 WAY LG BORE MALE ST (IV SETS) IMPLANT
SUT ETHILON 3 0 PS 1 (SUTURE) IMPLANT
SUT MNCRL AB 4-0 PS2 18 (SUTURE) ×5 IMPLANT
SUT PROLENE 6 0 BV (SUTURE) ×5 IMPLANT
SUT SILK 3 0 (SUTURE)
SUT SILK 3-0 18XBRD TIE 12 (SUTURE) IMPLANT
SUT VIC AB 2-0 CT1 27 (SUTURE)
SUT VIC AB 2-0 CT1 TAPERPNT 27 (SUTURE) ×1 IMPLANT
SUT VIC AB 3-0 SH 27 (SUTURE) ×3
SUT VIC AB 3-0 SH 27X BRD (SUTURE) ×1 IMPLANT
SYR CONTROL 10ML LL (SYRINGE) IMPLANT
TOWEL GREEN STERILE (TOWEL DISPOSABLE) ×3 IMPLANT
TUBING ART PRESS 48 MALE/FEM (TUBING) IMPLANT
WATER STERILE IRR 1000ML POUR (IV SOLUTION) ×3 IMPLANT

## 2018-02-17 NOTE — Transfer of Care (Signed)
Immediate Anesthesia Transfer of Care Note  Patient: Jill Williams  Procedure(s) Performed: Right Carotid  ENDARTERECTOMY (Right Neck)  Patient Location: PACU  Anesthesia Type:General  Level of Consciousness: awake and patient cooperative  Airway & Oxygen Therapy: Patient Spontanous Breathing and Patient connected to nasal cannula oxygen  Post-op Assessment: Report given to RN, Post -op Vital signs reviewed and stable and Patient moving all extremities  Post vital signs: Reviewed and stable  Last Vitals:  Vitals Value Taken Time  BP 155/87 02/17/2018 11:42 AM  Temp    Pulse 97 02/17/2018 11:43 AM  Resp 12 02/17/2018 11:43 AM  SpO2 96 % 02/17/2018 11:43 AM  Vitals shown include unvalidated device data.  Last Pain:  Vitals:   02/17/18 0840  TempSrc: Oral  PainSc:          Complications: No apparent anesthesia complications

## 2018-02-17 NOTE — Op Note (Signed)
Patient name: Jill Williams MRN: [id number] DOB: 24-Feb-1971 Sex: female  02/17/2018 Pre-operative Diagnosis: Symptomatic right ICA stenosis Post-operative diagnosis:  Same Surgeon:  Erlene Quan C. Donzetta Matters, MD Assistant: Arlee Muslim, PA Procedure Performed: Right carotid endarterectomy with bovine pericardial patch angioplasty  Indications: 47 year old female with a history of a right brain stroke now presents with further left upper extremity symptoms.  She is found to have high-grade stenosis of her bilateral carotid arteries worse on the right than the left.  She is indicated for carotid endarterectomy.  Findings: There is a very focal tight stenosis of the internal carotid artery.  There is disease extending down to the common carotid artery.  After endarterectomy and patch angioplasty there is a strong signal throughout diastole in the internal carotid artery and also in the external carotid artery.  She can awaken from anesthesia she is able to move both feet she has very weak left upper extremity at baseline.   Procedure:  The patient was identified in the holding area and taken to the operating room where she is placed supine on the operative table and general anesthesia was induced.  She was to the prepped and draped in the right neck, given antibiotics and a timeout was called.  We began with a longitudinal incision along the anterior border the sternal cleidomastoid.  We dissected down and identified the common carotid artery and the umbilical tape was placed around this patient was given 6000 units of heparin.  ACT later returned to 30 she is given an additional 2000.  We then dissected up first identified the superior thyroidal branch placed a 2-0 silk tie around this and a Potts configuration.  We then divided the facial vein at this time we identified a very low riding hypoglossal nerve which was protected throughout its course.  Placed a vessel loop around the external carotid  artery and then dissected up onto the internal placed a vessel loop around this.  After ACT had returned we prepared a shunt as well as a bovine pericardial patch.  We clamped the internal followed by the common and external arteries.  We open the artery longitudinally first with 11 blade followed by Potts scissors.  We placed the shunt distally allowed backbleeding and then placed approximately.  We confirm flow with Doppler.  We then proceeded with endarterectomy starting near the common carotid artery first.  We extended up onto the external performed eversion there and get did have good backbleeding.  We then continued up onto the internal carotid artery where we did have nice feathering did not have to place any tacking sutures.  We irrigated with heparinized saline and then sewed a bovine pericardial patch in place 6-0 Prolene suture.  Prior to completing our anastomosis we removed our shunt allowed antegrade backbleeding from all of our vessels.  We did have to place a repair suture at the most proximal extent with a 6-0 Prolene.  We then checked with Doppler had good signals.  50 mg of protamine was administered which she tolerated well.  We irrigated our wound obtained hemostasis and closed in layers platysma with Vicryl skin with 4 Monocryl and Dermabond was placed to level the skin.  She was allowed away from anesthesia in the operating room was noted to be moving both legs and following simple commands.  She was transferred to the PACU in stable condition.  All counts were correct at completion.  EBL 100 cc    Becki Mccaskill C. Donzetta Matters, MD  [facility] Office: [phone] Pager: [phone]

## 2018-02-17 NOTE — Anesthesia Preprocedure Evaluation (Addendum)
Anesthesia Evaluation  Patient identified by MRN, date of birth, ID band Patient awake    Reviewed: Allergy & Precautions, NPO status , Patient's Chart, lab work & pertinent test results, reviewed documented beta blocker date and time   Airway Mallampati: I  TM Distance: >3 FB Neck ROM: Full    Dental  (+) Edentulous Upper, Edentulous Lower, Dental Advisory Given   Pulmonary Current Smoker,     + decreased breath sounds      Cardiovascular hypertension, Pt. on home beta blockers and Pt. on medications + Peripheral Vascular Disease   Rhythm:Regular Rate:Normal     Neuro/Psych TIACVA    GI/Hepatic Neg liver ROS, GERD  Medicated,  Endo/Other  negative endocrine ROS  Renal/GU negative Renal ROS     Musculoskeletal negative musculoskeletal ROS (+)   Abdominal Normal abdominal exam  (+)   Peds  Hematology negative hematology ROS (+)   Anesthesia Other Findings   Reproductive/Obstetrics                           Lab Results  Component Value Date   WBC 5.8 02/17/2018   HGB 13.1 02/17/2018   HCT 41.3 02/17/2018   MCV 90.0 02/17/2018   PLT 247 02/17/2018   Lab Results  Component Value Date   CREATININE 0.68 02/14/2018   BUN 5 (L) 02/14/2018   NA 141 02/14/2018   K 3.6 02/14/2018   CL 109 02/14/2018   CO2 24 02/14/2018   Lab Results  Component Value Date   INR 1.03 01/18/2018   Echo: - Left ventricle: The cavity size was normal. Systolic function was   normal. The estimated ejection fraction was in the range of 60%   to 65%.  EKG: normal sinus rhythm.   Anesthesia Physical Anesthesia Plan  ASA: III  Anesthesia Plan: General   Post-op Pain Management:    Induction: Intravenous  PONV Risk Score and Plan: 3 and Ondansetron, Dexamethasone, Midazolam and Treatment may vary due to age or medical condition  Airway Management Planned: Oral ETT  Additional Equipment: Arterial  line  Intra-op Plan:   Post-operative Plan: Extubation in OR  Informed Consent: I have reviewed the patients History and Physical, chart, labs and discussed the procedure including the risks, benefits and alternatives for the proposed anesthesia with the patient or authorized representative who has indicated his/her understanding and acceptance.   Dental advisory given  Plan Discussed with: CRNA, Anesthesiologist and Surgeon  Anesthesia Plan Comments:        Anesthesia Quick Evaluation

## 2018-02-17 NOTE — Anesthesia Procedure Notes (Addendum)
Arterial Line Insertion Start/End9/03/2018 9:40 AM Performed by: Effie Berkshire, MD, Marsa Aris, CRNA, CRNA  Patient location: Pre-op. Preanesthetic checklist: patient identified, IV checked, risks and benefits discussed, surgical consent, monitors and equipment checked, pre-op evaluation, timeout performed and anesthesia consent Lidocaine 1% used for infiltration Right, radial was placed Catheter size: 20 G Hand hygiene performed  and maximum sterile barriers used  Allen's test indicative of satisfactory collateral circulation Attempts: 1 Procedure performed without using ultrasound guided technique. Following insertion, dressing applied and Biopatch. Post procedure assessment: normal and unchanged  Patient tolerated the procedure well with no immediate complications.

## 2018-02-17 NOTE — Anesthesia Postprocedure Evaluation (Signed)
Anesthesia Post Note  Patient: Jenel Gierke  Procedure(s) Performed: Right Carotid  ENDARTERECTOMY (Right Neck)     Patient location during evaluation: PACU Anesthesia Type: General Level of consciousness: awake and alert Pain management: pain level controlled Vital Signs Assessment: post-procedure vital signs reviewed and stable Respiratory status: spontaneous breathing, nonlabored ventilation, respiratory function stable and patient connected to nasal cannula oxygen Cardiovascular status: blood pressure returned to baseline and stable Postop Assessment: no apparent nausea or vomiting Anesthetic complications: no    Last Vitals:  Vitals:   02/17/18 1345 02/17/18 1400  BP:  (!) 173/74  Pulse: 70 84  Resp: 12 17  Temp:    SpO2: 100% 100%    Last Pain:  Vitals:   02/17/18 1345  TempSrc:   PainSc: 0-No pain                 Effie Berkshire

## 2018-02-17 NOTE — Progress Notes (Signed)
  Progress Note    02/17/2018 9:12 AM Day of Surgery  Subjective:  No new issues this morning  Vitals:   02/17/18 0437 02/17/18 0840  BP:  134/84  Pulse:  82  Resp:  (!) 21  Temp: 98 F (36.7 C) 98.4 F (36.9 C)  SpO2:  100%    Physical Exam: Awake alert oriented Tongue is midline Right arm is fully neurologically intact Left arm has grip strength 3 out of 5, left sided claw hand deformity  Moves bilateral lower extremities equally    CBC    Component Value Date/Time   WBC 5.8 02/17/2018 0627   RBC 4.59 02/17/2018 0627   HGB 13.1 02/17/2018 0627   HCT 41.3 02/17/2018 0627   PLT 247 02/17/2018 0627   MCV 90.0 02/17/2018 0627   MCH 28.5 02/17/2018 0627   MCHC 31.7 02/17/2018 0627   RDW 13.7 02/17/2018 0627   LYMPHSABS 2.2 02/04/2018 0404   MONOABS 0.4 02/04/2018 0404   EOSABS 0.2 02/04/2018 0404   BASOSABS 0.1 02/04/2018 0404    BMET    Component Value Date/Time   NA 141 02/14/2018 1032   K 3.6 02/14/2018 1032   CL 109 02/14/2018 1032   CO2 24 02/14/2018 1032   GLUCOSE 119 (H) 02/14/2018 1032   BUN 5 (L) 02/14/2018 1032   CREATININE 0.68 02/14/2018 1032   CALCIUM 8.9 02/14/2018 1032   GFRNONAA >60 02/14/2018 1032   GFRAA >60 02/14/2018 1032    INR    Component Value Date/Time   INR 1.03 01/18/2018 2229     Intake/Output Summary (Last 24 hours) at 02/17/2018 0912 Last data filed at 02/16/2018 1633 Gross per 24 hour  Intake 65.07 ml  Output -  Net 65.07 ml     Assessment:  47 y.o. female is Here with a history of a right-sided brain stroke. She has a high-grade stenosis on the right. She is indicated for right-sided carotid endarterectomy.  Plan: For today for right carotid endarterectomy. She has no family available to discuss the procedure with but her boyfriend who is currently with the procedure will be available later.   Jill Williams C. Donzetta Matters, MD [facility] Office: [phone] Pager:  [phone]  02/17/2018 9:12 AM

## 2018-02-17 NOTE — Progress Notes (Signed)
PACU staff came to floor to get patient's belongings that included her purse, cell phone with charger cord, and dentures.

## 2018-02-17 NOTE — Anesthesia Procedure Notes (Signed)
Procedure Name: Intubation Date/Time: 02/17/2018 10:00 AM Performed by: Moshe Salisbury, CRNA Pre-anesthesia Checklist: Patient identified, Emergency Drugs available, Suction available and Patient being monitored Patient Re-evaluated:Patient Re-evaluated prior to induction Oxygen Delivery Method: Circle System Utilized Preoxygenation: Pre-oxygenation with 100% oxygen Induction Type: IV induction Ventilation: Mask ventilation without difficulty Laryngoscope Size: Miller and 2 Grade View: Grade I Tube type: Oral Tube size: 7.5 mm Number of attempts: 1 Airway Equipment and Method: Stylet and Oral airway Placement Confirmation: ETT inserted through vocal cords under direct vision,  positive ETCO2 and breath sounds checked- equal and bilateral Secured at: 22 cm Tube secured with: Tape Dental Injury: Teeth and Oropharynx as per pre-operative assessment

## 2018-02-17 NOTE — Progress Notes (Signed)
Patient received from PACU, A&Ox4, VSS. Telemetry applied and CCMD notified. 

## 2018-02-17 NOTE — Progress Notes (Signed)
[facility] PROGRESS NOTE  Jill Williams ZOX:[id number] DOB: 1970-06-18 DOA: 02/14/2018 PCP: Maris Berger, MD  Brief summary   47 year old female with past medical history of stroke with residual left-sided weakness, tobacco abuse, hyperlipidemia who presented to the emergency department with transient left-sided weakness and numbness, dizziness.  Work-up including a CT of the head, MRI of the brain, MRA was negative for new stroke.  Recent CT angiogram of the neck had shown critical carotid artery stenosis on the right at 90%.  Vascular surgery has been consulted.  She is being planned for right carotid endarterectomy today.  Currently on heparin.  Assessment/Plan:  TIA: Most likely her symptoms are associated with TIA.  She does not have any new focal neurological deficits.  She has a history of a stroke and has residual left-sided weakness more on the left upper extremity.  MRI of the brain did not show any acute stroke.  Her symptoms might be associated with her severe right carotid artery disease.   -Continue aspirin.  Continue Lipitor.  Continue heparin, pend surgery today   Right-sided carotid artery disease: Recent CT angio of the neck revealed critical right carotid artery stenosis at 90% with predominant fibrofatty plaque.   -Vascular surgery following and planning for right carotid endarterectomy today. Cont per vascular surgery    Hypertension: Currently blood pressure stable.  resume lisinopril post op. Monitor   History of hyperlipidemia: Continue Lipitor 80 mg daily.   Code Status: full Family Communication: d/w patient (indicate person spoken with, relationship, and if by phone, the number) Disposition Plan: surgery today    Consultants:  Vascular surgery   Procedures:  Pend surgery    Antibiotics:  none (indicate start date, and stop date if known)  HPI/Subjective: Reports no new neurological symptoms overnight. Chronic left arm >leg  weakness. No acute pains.   Objective: Vitals:   02/16/18 2340 02/17/18 0437  BP:    Pulse:    Resp:    Temp: 98.1 F (36.7 C) 98 F (36.7 C)  SpO2:      Intake/Output Summary (Last 24 hours) at 02/17/2018 0743 Last data filed at 02/16/2018 1633 Gross per 24 hour  Intake 65.07 ml  Output -  Net 65.07 ml   Filed Weights   02/14/18 1106 02/14/18 1353  Weight: 56.5 kg 55.8 kg    Exam:   General:  No distress   Cardiovascular: s1,s2 rrr  Respiratory: CTA BL  Abdomen: soft, nt, ND  Musculoskeletal: no leg edema    Data Reviewed: Basic Metabolic Panel: Recent Labs  Lab 02/14/18 1032  NA 141  K 3.6  CL 109  CO2 24  GLUCOSE 119*  BUN 5*  CREATININE 0.68  CALCIUM 8.9   Liver Function Tests: Recent Labs  Lab 02/14/18 1032  AST 14*  ALT 8  ALKPHOS 78  BILITOT 0.5  PROT 5.9*  ALBUMIN 3.3*   No results for input(s): LIPASE, AMYLASE in the last 168 hours. No results for input(s): AMMONIA in the last 168 hours. CBC: Recent Labs  Lab 02/14/18 1032 02/15/18 0321 02/16/18 0531 02/17/18 0627  WBC 7.1 4.9 5.3 5.8  HGB 13.1 13.2 13.1 13.1  HCT 41.9 41.8 41.1 41.3  MCV 90.9 90.1 90.1 90.0  PLT 265 263 258 247   Cardiac Enzymes: No results for input(s): CKTOTAL, CKMB, CKMBINDEX, TROPONINI in the last 168 hours. BNP (last 3 results) No results for input(s): BNP in the last 8760 hours.  ProBNP (last 3 results)  No results for input(s): PROBNP in the last 8760 hours.  CBG: No results for input(s): GLUCAP in the last 168 hours.  Recent Results (from the past 240 hour(s))  Surgical pcr screen     Status: None   Collection Time: 02/17/18  4:13 AM  Result Value Ref Range Status   MRSA, PCR NEGATIVE NEGATIVE Final   Staphylococcus aureus NEGATIVE NEGATIVE Final    Comment: (NOTE) The Xpert SA Assay (FDA approved for NASAL specimens in patients 107 years of age and older), is one component of a comprehensive surveillance program. It is not intended to  diagnose infection nor to guide or monitor treatment. Performed at [facility], [place] [address] [place], [place] [address]      Studies: No results found.  Scheduled Meds: . aspirin EC  81 mg Oral Daily  . atorvastatin  80 mg Oral q1800  . nicotine  7 mg Transdermal Daily  . pantoprazole  40 mg Oral Q0600   Continuous Infusions: .  ceFAZolin (ANCEF) IV    . heparin 1,000 Units/hr (02/17/18 0423)    Principal Problem:   Right-sided carotid artery disease ([place]) Active Problems:   HLD (hyperlipidemia)   Tobacco abuse   TIA (transient ischemic attack)   History of stroke with residual deficit    Time spent: >35 minutes     Kinnie Feil  [facility] Pager [phone]. If 7PM-7AM, please contact night-coverage at [email or web address], password [facility] 02/17/2018, 7:43 AM  LOS: 3 days

## 2018-02-18 ENCOUNTER — Telehealth: Payer: Self-pay | Admitting: Vascular Surgery

## 2018-02-18 ENCOUNTER — Encounter (HOSPITAL_COMMUNITY): Payer: Self-pay | Admitting: Vascular Surgery

## 2018-02-18 DIAGNOSIS — Z720 Tobacco use: Secondary | ICD-10-CM

## 2018-02-18 DIAGNOSIS — E7800 Pure hypercholesterolemia, unspecified: Secondary | ICD-10-CM

## 2018-02-18 DIAGNOSIS — G459 Transient cerebral ischemic attack, unspecified: Principal | ICD-10-CM

## 2018-02-18 DIAGNOSIS — I6930 Unspecified sequelae of cerebral infarction: Secondary | ICD-10-CM

## 2018-02-18 LAB — BASIC METABOLIC PANEL
Anion gap: 10 (ref 5–15)
BUN: 11 mg/dL (ref 6–20)
CHLORIDE: 109 mmol/L (ref 98–111)
CO2: 23 mmol/L (ref 22–32)
Calcium: 9.1 mg/dL (ref 8.9–10.3)
Creatinine, Ser: 0.73 mg/dL (ref 0.44–1.00)
GFR calc Af Amer: >60 mL/min (ref >60)
GFR calc non Af Amer: >60 mL/min (ref >60)
Glucose, Bld: 121 mg/dL — ABNORMAL HIGH (ref 70–99)
Potassium: 3.8 mmol/L (ref 3.5–5.1)
SODIUM: 142 mmol/L (ref 135–145)

## 2018-02-18 LAB — CBC
HCT: 37.3 % (ref 36.0–46.0)
Hemoglobin: 11.9 g/dL — ABNORMAL LOW (ref 12.0–15.0)
MCH: 28.7 pg (ref 26.0–34.0)
MCHC: 31.9 g/dL (ref 30.0–36.0)
MCV: 90.1 fL (ref 78.0–100.0)
PLATELETS: 262 10*3/uL (ref 150–400)
RBC: 4.14 MIL/uL (ref 3.87–5.11)
RDW: 13.7 % (ref 11.5–15.5)
WBC: 9 10*3/uL (ref 4.0–10.5)

## 2018-02-18 MED ORDER — TRAMADOL HCL 50 MG PO TABS: 50.0000 mg | ORAL_TABLET | Freq: Four times a day (QID) | ORAL | 0 refills | Status: DC | PRN

## 2018-02-18 NOTE — Discharge Instructions (Addendum)
Follow with Primary MD Maris Berger, MD in 7 days and recommended neurologist from your last admission within the next 1 to 2 weeks.  Get CBC, CMP checked  by Primary MD  in 5-7 days   Activity: As tolerated with Full fall precautions use walker/cane & assistance as needed  Disposition Home    Diet:   Heart Healthy    For Heart failure patients - Check your Weight same time everyday, if you gain over 2 pounds, or you develop in leg swelling, experience more shortness of breath or chest pain, call your Primary MD immediately. Follow Cardiac Low Salt Diet and 1.5 lit/day fluid restriction.  Special Instructions: If you have smoked or chewed Tobacco  in the last 2 yrs please stop smoking, stop any regular Alcohol  and or any Recreational drug use.  On your next visit with your primary care physician please Get Medicines reviewed and adjusted.  Please request your Prim.MD to go over all Hospital Tests and Procedure/Radiological results at the follow up, please get all Hospital records sent to your Prim MD by signing hospital release before you go home.  If you experience worsening of your admission symptoms, develop shortness of breath, life threatening emergency, suicidal or homicidal thoughts you must seek medical attention immediately by calling 911 or calling your MD immediately  if symptoms less severe.  You Must read complete instructions/literature along with all the possible adverse reactions/side effects for all the Medicines you take and that have been prescribed to you. Take any new Medicines after you have completely understood and accpet all the possible adverse reactions/side effects.     [facility]  Discharge Instructions   Carotid Endarterectomy (CEA)  Please refer to the following instructions for your post-procedure care. Your surgeon or physician assistant will discuss any changes with you.  Activity  You are encouraged to walk as  much as you can. You can slowly return to normal activities but must avoid strenuous activity and heavy lifting until your doctor tell you it's OK. Avoid activities such as vacuuming or swinging a golf club. You can drive after one week if you are comfortable and you are no longer taking prescription pain medications. It is normal to feel tired for serval weeks after your surgery. It is also normal to have difficulty with sleep habits, eating, and bowel movements after surgery. These will go away with time.  Bathing/Showering  You may shower after you come home. Do not soak in a bathtub, hot tub, or swim until the incision heals completely.  Incision Care  Shower every day. Clean your incision with mild soap and water. Pat the area dry with a clean towel. You do not need a bandage unless otherwise instructed. Do not apply any ointments or creams to your incision. You may have skin glue on your incision. Do not peel it off. It will come off on its own in about one week. Your incision may feel thickened and raised for several weeks after your surgery. This is normal and the skin will soften over time. For Men Only: It's OK to shave around the incision but do not shave the incision itself for 2 weeks. It is common to have numbness under your chin that could last for several months.  Diet  Resume your normal diet. There are no special food restrictions following this procedure. A low fat/low cholesterol diet is recommended for all patients with vascular disease. In order to heal from  your surgery, it is CRITICAL to get adequate nutrition. Your body requires vitamins, minerals, and protein. Vegetables are the best source of vitamins and minerals. Vegetables also provide the perfect balance of protein. Processed food has little nutritional value, so try to avoid this.        Medications  Resume taking all of your medications unless your doctor or physician assistant tells you not to. If your incision  is causing pain, you may take over-the- counter pain relievers such as acetaminophen (Tylenol). If you were prescribed a stronger pain medication, please be aware these medications can cause nausea and constipation. Prevent nausea by taking the medication with a snack or meal. Avoid constipation by drinking plenty of fluids and eating foods with a high amount of fiber, such as fruits, vegetables, and grains. Do not take Tylenol if you are taking prescription pain medications.  Follow Up  Our office will schedule a follow up appointment 2-3 weeks following discharge.  Please call us immediately for any of the following conditions  Increased pain, redness, drainage (pus) from your incision site. Fever of 101 degrees or higher. If you should develop stroke (slurred speech, difficulty swallowing, weakness on one side of your body, loss of vision) you should call 911 and go to the nearest emergency room.  Reduce your risk of vascular disease:  Stop smoking. If you would like help call [facility] at 1-800-QUIT-NOW [phone]) or [place] at [phone]. Manage your cholesterol Maintain a desired weight Control your diabetes Keep your blood pressure down  If you have any questions, please call the office at [phone].

## 2018-02-18 NOTE — Discharge Summary (Signed)
Jill Williams HQP:[id number] DOB: 1971-05-13 DOA: 02/14/2018  PCP: Maris Berger, MD  Admit date: 02/14/2018  Discharge date: 02/18/2018  Admitted From: Home  Disposition: Home   Recommendations for Outpatient Follow-up:   Follow up with PCP in 1-2 weeks  PCP Please obtain BMP/CBC, 2 view CXR in 1week,  (see Discharge instructions)   PCP Please follow up on the following pending results:   Home Health: PT, RN, OT   Equipment/Devices: None  Consultations: VVS  Discharge Condition: Stable   CODE STATUS: Full   Diet Recommendation: Heart Health  CC - weakness  Brief history of present illness from the day of admission and additional interim summary    47 year old female with past medical history of stroke with residual left-sided weakness, tobacco abuse, hyperlipidemia who presented to the emergency department with transient left-sided weakness and numbness,dizziness. Work-up including a CT of the head, MRI of the brain was negative for new stroke. Recent CT angiogram of the neck had shown critical carotid artery stenosis on the right at 90%.Vascular surgery has been consulted and she underwent right carotid endarterectomy this admission.                                                                 Hospital Course    TIA: Most likely her symptoms are associated with TIA. She does not have any new focal neurological deficits. She has a history of a stroke and has residual left-sided weakness more on the left upper extremity. MRI of the brain did not show any acute stroke. Her symptoms might be associatedwith her severe right carotid artery disease.   Kindly see #2 below, for now she is at baseline and we will continue combination of aspirin and statin for secondary prevention with outpatient  neurology follow-up.  She will also get home PT and RN if she qualifies for her continued left arm weakness.  Consult extensively to quit smoking.  She will follow with PCP, neurology and vascular surgery post discharge.   Right-sided carotid artery disease:Recent CT angio ofthe neck revealed critical right carotid artery stenosis at 90% with predominant fibrofatty plaque.  Vascular surgery consulted and she underwent right-sided carotid endarterectomy on 02/17/2018, case discussed with Dr. Gwenlyn Saran vascular surgery this morning by me personally, patient stable to be discharged on aspirin and statin.  She has been counseled to quit smoking.  She is back to her baseline and eager to be discharged home.   Hypertension: Currently blood pressure stable.   Resume home medications PCP to monitor secondary risk factors for TIA/CVA.  History of hyperlipidemia: Continue Lipitor 80 mg daily.  Ongoing smoking.  Continue NicoDerm patch, counseled to quit.   Discharge diagnosis     Principal Problem:   Right-sided carotid artery disease (HCC) Active Problems:  HLD (hyperlipidemia)   Tobacco abuse   TIA (transient ischemic attack)   History of stroke with residual deficit    Discharge instructions    Discharge Instructions    Diet - low sodium heart healthy   Complete by:  As directed    Discharge instructions   Complete by:  As directed    Follow with Primary MD Maris Berger, MD in 7 days and recommended neurologist from your last admission within the next 1 to 2 weeks.  Get CBC, CMP checked  by Primary MD  in 5-7 days   Activity: As tolerated with Full fall precautions use walker/cane & assistance as needed  Disposition Home    Diet:   Heart Healthy    For Heart failure patients - Check your Weight same time everyday, if you gain over 2 pounds, or you develop in leg swelling, experience more shortness of breath or chest pain, call your Primary MD immediately. Follow Cardiac Low Salt  Diet and 1.5 lit/day fluid restriction.  Special Instructions: If you have smoked or chewed Tobacco  in the last 2 yrs please stop smoking, stop any regular Alcohol  and or any Recreational drug use.  On your next visit with your primary care physician please Get Medicines reviewed and adjusted.  Please request your Prim.MD to go over all Hospital Tests and Procedure/Radiological results at the follow up, please get all Hospital records sent to your Prim MD by signing hospital release before you go home.  If you experience worsening of your admission symptoms, develop shortness of breath, life threatening emergency, suicidal or homicidal thoughts you must seek medical attention immediately by calling 911 or calling your MD immediately  if symptoms less severe.  You Must read complete instructions/literature along with all the possible adverse reactions/side effects for all the Medicines you take and that have been prescribed to you. Take any new Medicines after you have completely understood and accpet all the possible adverse reactions/side effects.   Increase activity slowly   Complete by:  As directed       Discharge Medications   Allergies as of 02/18/2018      Reactions   Ivp Dye [iodinated Diagnostic Agents] Shortness Of Breath, Rash   Codeine Nausea And Vomiting   Influenza Vaccine Live Rash   Morphine And Related Rash   Pneumococcal Vaccines Rash   Shrimp [shellfish Allergy] Nausea And Vomiting      Medication List    TAKE these medications   aspirin 81 MG EC tablet Take 1 tablet (81 mg total) by mouth daily.   atorvastatin 80 MG tablet Commonly known as:  LIPITOR Take 1 tablet (80 mg total) by mouth daily at 6 PM.   famotidine 40 MG tablet Commonly known as:  PEPCID Take 40 mg by mouth daily.   lisinopril 5 MG tablet Commonly known as:  PRINIVIL,ZESTRIL Take 5 mg by mouth at bedtime.   metoprolol succinate 50 MG 24 hr tablet Commonly known as:  TOPROL-XL Take  25 mg by mouth 2 (two) times daily.   nicotine 7 mg/24hr patch Commonly known as:  NICODERM CQ - dosed in mg/24 hr Place 1 patch (7 mg total) onto the skin daily.   pantoprazole 40 MG tablet Commonly known as:  PROTONIX Take 1 tablet (40 mg total) by mouth daily at 6 (six) AM.   traMADol 50 MG tablet Commonly known as:  ULTRAM Take 1 tablet (50 mg total) by mouth every 6 (six) hours as  needed for moderate pain.       Follow-up Information    Waynetta Sandy, MD Follow up in 2 week(s).   Specialties:  Vascular Surgery, Cardiology Contact information: [place] [address] [phone]        Maris Berger, MD. Schedule an appointment as soon as possible for a visit in 1 week(s).   Specialty:  Family Medicine Contact information: [place] [address] [phone]        Garvin Fila, MD. Schedule an appointment as soon as possible for a visit in 2 week(s).   Specialties:  Neurology, Radiology Why:  [facility] Contact information: [address] [place] [address] [phone]           Major procedures and Radiology Reports - PLEASE review detailed and final reports thoroughly  -       Mr Brain Wo Contrast  Result Date: 02/14/2018 CLINICAL DATA:  New onset of left-sided numbness and dizziness beginning yesterday. EXAM: MRI HEAD WITHOUT CONTRAST TECHNIQUE: Multiplanar, multiecho pulse sequences of the brain and surrounding structures were obtained without intravenous contrast. COMPARISON:  CT head without contrast 02/13/2018. MRI brain 01/19/2018 FINDINGS: Brain: The remote right basal ganglia infarct is stable. No acute infarct, hemorrhage, or mass lesion is present. Mild white matter disease otherwise is stable. There is stable ex vacuo dilation of the right lateral ventricle. The internal auditory canals are within normal limits bilaterally. The brainstem and cerebellum are normal. Chronic wallerian  degeneration is evident in the brainstem. Vascular: Flow is present in the major intracranial arteries. Skull and upper cervical spine: The skull base is within normal limits. The craniocervical junction is normal. The upper cervical spine is unremarkable. Sinuses/Orbits: The paranasal sinuses and mastoid air cells are clear. Globes and orbits are within normal limits. IMPRESSION: 1. Remote right basal ganglia infarct with associated ex vacuo dilation of the right lateral ventricle is stable. 2. No acute intracranial abnormalities. Electronically Signed   By: San Morelle M.D.   On: 02/14/2018 13:54    Micro Results   Recent Results (from the past 240 hour(s))  Surgical pcr screen     Status: None   Collection Time: 02/17/18  4:13 AM  Result Value Ref Range Status   MRSA, PCR NEGATIVE NEGATIVE Final   Staphylococcus aureus NEGATIVE NEGATIVE Final    Comment: (NOTE) The Xpert SA Assay (FDA approved for NASAL specimens in patients 51 years of age and older), is one component of a comprehensive surveillance program. It is not intended to diagnose infection nor to guide or monitor treatment. Performed at [facility], [place] [address], [place], [place] [address]     Today   Subjective    Jill Williams today has no headache,no chest abdominal pain,no new weakness tingling or numbness, feels much better wants to go home today.   Objective   Blood pressure 129/71, pulse 77, temperature 98.4 F (36.9 C), temperature source Oral, resp. rate 17, height 5' 7.01" (1.702 m), weight 57.1 kg, SpO2 99 %.   Intake/Output Summary (Last 24 hours) at 02/18/2018 0949 Last data filed at 02/18/2018 0231 Gross per 24 hour  Intake 1739.94 ml  Output 575 ml  Net 1164.94 ml    Exam  Awake Alert, Oriented x 3, No new F.N deficits, L. Arm 1/5, Normal affect [place].AT,PERRAL Supple Neck,No JVD, No cervical lymphadenopathy appriciated.  Symmetrical Chest wall movement, Good air movement  bilaterally, CTAB RRR,No  Gallops,Rubs or new Murmurs, No Parasternal Heave +ve B.Sounds, Abd Soft, Non tender, No organomegaly appriciated, No rebound -guarding or rigidity. No Cyanosis, Clubbing or edema, No new Rash or bruise   Data Review   CBC w Diff:  Lab Results  Component Value Date   WBC 9.0 02/18/2018   HGB 11.9 (L) 02/18/2018   HCT 37.3 02/18/2018   PLT 262 02/18/2018   LYMPHOPCT 43 02/04/2018   MONOPCT 8 02/04/2018   EOSPCT 3 02/04/2018   BASOPCT 1 02/04/2018    CMP:  Lab Results  Component Value Date   NA 142 02/18/2018   K 3.8 02/18/2018   CL 109 02/18/2018   CO2 23 02/18/2018   BUN 11 02/18/2018   CREATININE 0.73 02/18/2018   PROT 5.9 (L) 02/14/2018   ALBUMIN 3.3 (L) 02/14/2018   BILITOT 0.5 02/14/2018   ALKPHOS 78 02/14/2018   AST 14 (L) 02/14/2018   ALT 8 02/14/2018  .   Total Time in preparing paper work, data evaluation and todays exam - 65 minutes  Lala Lund M.D on 02/18/2018 at 9:49 AM  [facility]   Office  [phone]

## 2018-02-18 NOTE — Progress Notes (Signed)
PT Cancellation Note  Patient Details Name: Jill Williams MRN: [id number] DOB: 1971/04/05   Cancelled Treatment:    Reason Eval/Treat Not Completed: PT screened, no needs identified, will sign off. Pt initially evaluated by Physical Therapy on 02/15/18 with no needs identified. Spoke with pt this morning who is indep with ambulation and ADLs, planning to d/c home today; not interested in any follow-up services. Will sign-off. Please reconsult if new needs arise.  Mabeline Caras, PT, DPT Acute Rehabilitation Services  Pager [phone] Office [place] 02/18/2018, 10:47 AM

## 2018-02-18 NOTE — Progress Notes (Addendum)
  Progress Note    02/18/2018 7:37 AM 1 Day Post-Op  Subjective:  No worsening L sided weakness or numbness.  No difficulty swallowing.  Minimal incision pain.  Ambulating in halls without difficulty.  Anticipating discharge today.   Vitals:   02/18/18 0230 02/18/18 0300  BP: 121/70 109/68  Pulse: 73 70  Resp: 16 13  Temp: 97.9 F (36.6 C) 98.4 F (36.9 C)  SpO2: 100% 99%   Physical Exam: Lungs:  Non labored Incisions:  R neck incision soft, no drainage or bleeding Extremities:   Moving all extremities well Abdomen:  Soft Neurologic: A&O  CBC    Component Value Date/Time   WBC 9.0 02/18/2018 0354   RBC 4.14 02/18/2018 0354   HGB 11.9 (L) 02/18/2018 0354   HCT 37.3 02/18/2018 0354   PLT 262 02/18/2018 0354   MCV 90.1 02/18/2018 0354   MCH 28.7 02/18/2018 0354   MCHC 31.9 02/18/2018 0354   RDW 13.7 02/18/2018 0354   LYMPHSABS 2.2 02/04/2018 0404   MONOABS 0.4 02/04/2018 0404   EOSABS 0.2 02/04/2018 0404   BASOSABS 0.1 02/04/2018 0404    BMET    Component Value Date/Time   NA 142 02/18/2018 0354   K 3.8 02/18/2018 0354   CL 109 02/18/2018 0354   CO2 23 02/18/2018 0354   GLUCOSE 121 (H) 02/18/2018 0354   BUN 11 02/18/2018 0354   CREATININE 0.73 02/18/2018 0354   CALCIUM 9.1 02/18/2018 0354   GFRNONAA >60 02/18/2018 0354   GFRAA >60 02/18/2018 0354    INR    Component Value Date/Time   INR 1.03 01/18/2018 2229     Intake/Output Summary (Last 24 hours) at 02/18/2018 0737 Last data filed at 02/18/2018 0231 Gross per 24 hour  Intake 1739.94 ml  Output 575 ml  Net 1164.94 ml     Assessment/Plan:  47 y.o. female is s/p R CEA due to symptomatic R ICA stenosis 1 Day Post-Op   R neck incision healing well Patient states L arm numbness and L leg weakness has resolved Tramadol rx left on paper chart Ok for discharge from vascular standpoint Office will arrange follow up in 2 weeks   Dagoberto Ligas, PA-C [facility] [phone] 02/18/2018 7:37 AM  I have independently interviewed and examined patient and agree with PA assessment and plan above.   Pharrell Ledford C. Donzetta Matters, MD [facility] [place] Office: [phone] Pager: [phone]

## 2018-02-18 NOTE — Progress Notes (Signed)
Jill Williams to be D/C'd Home per MD order. Discussed with the patient and all questions fully answered.    An After Visit Summary was printed and given to the patient.   [name]  02/18/2018 11:16 AM

## 2018-02-18 NOTE — Progress Notes (Signed)
Pt tearful, angry. States no one available to bring her other clothes or shoes. She is concerned her pants and shoes are wet.  Offered her replacement clothing. Pt insists on new shoes since hers were wet. Explained our process related to reporting personal belonging issue. Offered socks, shoe coverings etc. Pt adamant about shoes. Will continue to help her identify other alternatives.--JM

## 2018-02-18 NOTE — Progress Notes (Signed)
Noted orders for [facility] services; CM talked to patient to offer [place], pt refused att services at this time; Aneta Mins [phone]

## 2018-02-18 NOTE — Telephone Encounter (Signed)
sch appt lvm 03/06/18 915am p/o MD

## 2018-03-06 ENCOUNTER — Encounter: Payer: Medicaid Other | Admitting: Vascular Surgery

## 2018-03-10 ENCOUNTER — Encounter: Payer: Self-pay | Admitting: Vascular Surgery

## 2018-06-12 DIAGNOSIS — K625 Hemorrhage of anus and rectum: Secondary | ICD-10-CM | POA: Insufficient documentation

## 2018-07-11 HISTORY — PX: COLONOSCOPY: SHX174

## 2018-07-16 DIAGNOSIS — R590 Localized enlarged lymph nodes: Secondary | ICD-10-CM | POA: Diagnosis not present

## 2018-07-16 DIAGNOSIS — G8220 Paraplegia, unspecified: Secondary | ICD-10-CM

## 2018-07-16 DIAGNOSIS — I69364 Other paralytic syndrome following cerebral infarction affecting left non-dominant side: Secondary | ICD-10-CM | POA: Diagnosis not present

## 2018-07-16 DIAGNOSIS — Z801 Family history of malignant neoplasm of trachea, bronchus and lung: Secondary | ICD-10-CM

## 2018-07-16 DIAGNOSIS — Z803 Family history of malignant neoplasm of breast: Secondary | ICD-10-CM

## 2018-07-16 DIAGNOSIS — C20 Malignant neoplasm of rectum: Secondary | ICD-10-CM | POA: Diagnosis not present

## 2018-07-16 DIAGNOSIS — M21372 Foot drop, left foot: Secondary | ICD-10-CM

## 2018-07-16 DIAGNOSIS — I10 Essential (primary) hypertension: Secondary | ICD-10-CM

## 2018-10-05 DIAGNOSIS — C20 Malignant neoplasm of rectum: Secondary | ICD-10-CM | POA: Diagnosis not present

## 2018-10-13 ENCOUNTER — Ambulatory Visit: Payer: Self-pay | Admitting: General Surgery

## 2018-10-13 NOTE — H&P (Signed)
History of Present Illness Leighton Ruff MD; 0/07/5850 10:22 AM) The patient is a 48 year old female who presents with colorectal cancer. 48 year old female who presents to the office for evaluation of a rectal cancer. Patient underwent a carotid endarterectomy late last year. She was placed on anticoagulation and developed rectal bleeding. She underwent a colonoscopy in January 2020 which showed a distal rectal mass. A port was placed and she was started on chemotherapy and radiation at [facility]. She smokes approximately a pack a day of cigarettes. She lost some weight with chemotherapy and radiation but has gained about 50% of the back. Past abdominal surgical history consist of a oophorectomy and cholecystectomy. Patient has a history of a stroke with some hemiparesis of the left side.   Diagnostic Studies History (Tanisha A. Owens Shark, [place]; 10/13/2018 9:59 AM) Colonoscopy within last year Mammogram 1-3 years ago Pap Smear 1-5 years ago  Allergies (Tanisha A. Owens Shark, [place]; 10/13/2018 10:02 AM) Codeine Sulfate *ANALGESICS - OPIOID* Morphine Sulfate *ANALGESICS - OPIOID* Dyes Contrast Influenza Virus Vaccine *VACCINES* Shellfish Allergies Reconciled  Medication History (Tanisha A. Owens Shark, [place]; 10/13/2018 10:03 AM) Atorvastatin Calcium (80MG  Tablet, Oral) Active. Lisinopril (5MG  Tablet, Oral) Active. Ondansetron HCl (4MG  Tablet, Oral) Active. Aspirin (81MG  Tablet, Oral) Active. Lipitor (40MG  Tablet, Oral) Active. Metoprolol Tartrate (50MG  Tablet, Oral) Active. MiraLax (Oral) Active. Medications Reconciled  Social History (Tanisha A. Owens Shark, [place]; 10/13/2018 9:59 AM) Alcohol use Remotely quit alcohol use. Caffeine use Coffee, Tea. No drug use Tobacco use Current every day smoker.  Family History (Tanisha A. Owens Shark, [place]; 10/13/2018 9:59 AM) Hypertension Father, Mother. Respiratory Condition Father.  Pregnancy / Birth History (Tanisha A. Owens Shark, RMA; 10/13/2018  9:59 AM) Age at menarche 62 years. Age of menopause [age] Gravida 3 Irregular periods Length (months) of breastfeeding 7-12 Maternal age 53-20 Para 3  Other Problems (Tanisha A. Owens Shark, [place]; 10/13/2018 9:59 AM) Gastroesophageal Reflux Disease High blood pressure Oophorectomy     Review of Systems (Tanisha A. Brown RMA; 10/13/2018 9:59 AM) General Present- Weight Loss. Not Present- Appetite Loss, Chills, Fatigue, Fever, Night Sweats and Weight Gain. Skin Not Present- Change in Wart/Mole, Dryness, Hives, Jaundice, New Lesions, Non-Healing Wounds, Rash and Ulcer. HEENT Not Present- Earache, Hearing Loss, Hoarseness, Nose Bleed, Oral Ulcers, Ringing in the Ears, Seasonal Allergies, Sinus Pain, Sore Throat, Visual Disturbances, Wears glasses/contact lenses and Yellow Eyes. Respiratory Not Present- Bloody sputum, Chronic Cough, Difficulty Breathing, Snoring and Wheezing. Breast Not Present- Breast Mass, Breast Pain, Nipple Discharge and Skin Changes. Cardiovascular Not Present- Chest Pain, Difficulty Breathing Lying Down, Leg Cramps, Palpitations, Rapid Heart Rate, Shortness of Breath and Swelling of Extremities. Gastrointestinal Not Present- Abdominal Pain, Bloating, Bloody Stool, Change in Bowel Habits, Chronic diarrhea, Constipation, Difficulty Swallowing, Excessive gas, Gets full quickly at meals, Hemorrhoids, Indigestion, Nausea, Rectal Pain and Vomiting. Female Genitourinary Not Present- Frequency, Nocturia, Painful Urination, Pelvic Pain and Urgency. Musculoskeletal Not Present- Back Pain, Joint Pain, Joint Stiffness, Muscle Pain, Muscle Weakness and Swelling of Extremities. Neurological Not Present- Decreased Memory, Fainting, Headaches, Numbness, Seizures, Tingling, Tremor, Trouble walking and Weakness. Psychiatric Not Present- Anxiety, Bipolar, Change in Sleep Pattern, Depression, Fearful and Frequent crying. Endocrine Not Present- Cold Intolerance, Excessive Hunger, Hair  Changes, Heat Intolerance, Hot flashes and New Diabetes.  Vitals (Tanisha A. Brown RMA; 10/13/2018 10:00 AM) 10/13/2018 9:59 AM Weight: 109 lb Height: 67in Body Surface Area: 1.56 m Body Mass Index: 17.07 kg/m  Temp.: 98.61F  Pulse: 105 (Regular)  BP: 142/86 (Sitting, Left Arm, Standard)  Physical Exam Leighton Ruff MD; 11/10/9474 10:17 AM)  General Mental Status-Alert. General Appearance-Not in acute distress. Build & Nutrition-Well nourished. Posture-Normal posture. Gait-Normal.  Head and Neck Head-normocephalic, atraumatic with no lesions or palpable masses. Trachea-midline.  Chest and Lung Exam Chest and lung exam reveals -on auscultation, normal breath sounds, no adventitious sounds and normal vocal resonance.  Cardiovascular Cardiovascular examination reveals -normal heart sounds, regular rate and rhythm with no murmurs and no digital clubbing, cyanosis, edema, increased warmth or tenderness.  Abdomen Inspection Inspection of the abdomen reveals - No Hernias. Palpation/Percussion Palpation and Percussion of the abdomen reveal - Soft, Non Tender, No Rigidity (guarding), No hepatosplenomegaly and No Palpable abdominal masses.  Rectal Anorectal Exam External - Note: mild anal gaping. Internal - Note: mass palpated posterior midline above coccyx (~6cm).  Neurologic Neurologic evaluation reveals -alert and oriented x 3 with no impairment of recent or remote memory, normal attention span and ability to concentrate, normal sensation and normal coordination.  Musculoskeletal Normal Exam - Bilateral-Upper Extremity Strength Normal and Lower Extremity Strength Normal.    Assessment & Plan Leighton Ruff MD; 10/11/6501 10:19 AM)  RECTAL CANCER (C20) Impression: 48 year old female who presents to the office with complaints of a newly diagnosed rectal cancer. This was noted to be in the distal rectum on colonoscopy. We will obtain her  colonoscopy report as well as her CT scans. She completed chemotherapy and radiation approximately 2 weeks ago. We discussed surgical resection using a robotic approach. We discussed the need for a temporary ostomy. We discussed that if she does not stop smoking we will have to perform a permanent colostomy. She did agree to stop smoking. We discussed the change in bowel habits after surgery and that she will most likely have multiple bowel movements with pain and sometimes fecal leakage.  I have requested the colonoscopy report and CT scans to be evaluated prior to surgery.  The surgery and anatomy were described to the patient as well as the risks of surgery and the possible complications. These include: Bleeding, deep abdominal infections and possible wound complications such as hernia and infection, damage to adjacent structures, leak of surgical connections, which can lead to other surgeries and possibly an ostomy, possible need for other procedures, such as abscess drains in radiology, possible prolonged hospital stay, possible diarrhea from removal of part of the colon, possible constipation from narcotics, possible bowel, bladder or sexual dysfunction if having rectal surgery, prolonged fatigue/weakness or appetite loss, possible early recurrence of of disease, possible complications of their medical problems such as heart disease or arrhythmias or lung problems, death (less than 1%). I believe the patient understands and wishes to proceed with the surgery.

## 2018-10-29 DIAGNOSIS — C20 Malignant neoplasm of rectum: Secondary | ICD-10-CM

## 2018-12-04 NOTE — Patient Instructions (Addendum)
Jill Williams    Your procedure is scheduled on: Thursday 12/10/2018   Report to [facility] Main  Entrance Report to Short Stay at  05:45  AM               YOU NEED TO HAVE A COVID 19 TEST ON__6/29_____ @_2 :30______, THIS TEST MUST BE DONE BEFORE SURGERY, COME TO [place].             ONCE YOUR COVID TEST IS COMPLETED, PLEASE BEGIN THE QUARANTINE INSTRUCTIONS AS OUTLINED IN YOUR HANDOUT.   Call this number if you have problems the morning of surgery [phone]                 Remember: Do not eat food  :After Midnight on Tuesday 12/08/2018 as you will be following a Bowel pre and drinking clear liquids on Wednesday 12/09/2018!              Follow Bowel prep instructions from Dr. Marcello Moores office and drink clear liquids all day on Wednesday 12/09/2018.  DRINK 2 PRESURGERY ENSURE DRINKS THE NIGHT BEFORE SURGERY AT  1000 PM  AND 1 PRESURGERY DRINK THE DAY OF THE PROCEDURE 3 HOURS PRIOR TO SCHEDULED SURGERY.  NO SOLIDS AFTER MIDNIGHT THE DAY PRIOR TO THE SURGERY.  NOTHING BY MOUTH EXCEPT CLEAR LIQUIDS UNTIL THREE HOURS PRIOR TO SCHEDULED SURGERY.  PLEASE FINISH PRESURGERY ENSURE DRINK PER SURGEON ORDER 3 HOURS PRIOR TO SCHEDULED SURGERY TIME WHICH NEEDS TO BE COMPLETED AT _04:45 am.    CLEAR LIQUID DIET   Foods Allowed                                                                     Foods Excluded  Coffee and tea, regular and decaf                             liquids that you cannot  Plain Jell-O in any flavor                                             see through such as: Fruit ices (not with fruit pulp)                                     milk, soups, orange juice  Iced Popsicles                                    All solid food Carbonated beverages, regular and diet                                    Cranberry, grape and apple juices Sports drinks like Gatorade Lightly seasoned clear broth or consume(fat  free) Sugar, honey syrup  Sample Menu  Breakfast                                Lunch                                     Supper Cranberry juice                    Beef broth                            Chicken broth Jell-O                                     Grape juice                           Apple juice Coffee or tea                        Jell-O                                      Popsicle                                                Coffee or tea                        Coffee or tea  _____________________________________________________________________               BRUSH YOUR TEETH MORNING OF SURGERY AND RINSE YOUR MOUTH OUT, NO CHEWING GUM CANDY OR MINTS.     Take these medicines the morning of surgery with A SIP OF WATER:                                  You may not have any metal on your body including hair pins and              piercings              Do not wear jewelry, make-up, lotions, powders or perfumes, deodorant             Do not wear nail polish.  Do not shave  48 hours prior to surgery.               Do not bring valuables to the hospital. [place].  Contacts, dentures or bridgework may not be worn into surgery.                   Please read over the following fact sheets you were given: _____________________________________________________________________             [facility] - Preparing for Surgery Before surgery, you can play an important role.   Because  skin is not sterile, your skin needs to be as free of germs as possible.   You can reduce the number of germs on your skin by washing with CHG (chlorahexidine gluconate) soap before surgery.   CHG is an antiseptic cleaner which kills germs and bonds with the skin to continue killing germs even after washing. Please DO NOT use if you have an allergy to CHG or antibacterial soaps.   If your skin becomes reddened/irritated stop using the CHG and  inform your nurse when you arrive at Short Stay. Do not shave (including legs and underarms) for at least 48 hours prior to the first CHG shower.   Please follow these instructions carefully:  1.  Shower with CHG Soap the night before surgery and the  morning of Surgery.  2.  If you choose to wash your hair, wash your hair first as usual with your  normal  shampoo.  3.  After you shampoo, rinse your hair and body thoroughly to remove the  shampoo.                                        4.  Use CHG as you would any other liquid soap.  You can apply chg directly  to the skin and wash                       Gently with a scrungie or clean washcloth.  5.  Apply the CHG Soap to your body ONLY FROM THE NECK DOWN.   Do not use on face/ open                           Wound or open sores. Avoid contact with eyes, ears mouth and genitals (private parts).                       Wash face,  Genitals (private parts) with your normal soap.             6.  Wash thoroughly, paying special attention to the area where your surgery  will be performed.  7.  Thoroughly rinse your body with warm water from the neck down.  8.  DO NOT shower/wash with your normal soap after using and rinsing off  the CHG Soap.             9.  Pat yourself dry with a clean towel.            10.  Wear clean pajamas.            11.  Place clean sheets on your bed the night of your first shower and do not  sleep with pets . Day of Surgery : Do not apply any lotions/deodorants the morning of surgery.  Please wear clean clothes to the hospital/surgery center.   FAILURE TO FOLLOW THESE INSTRUCTIONS MAY RESULT IN THE CANCELLATION OF YOUR SURGERY PATIENT SIGNATURE_________________________________  NURSE SIGNATURE__________________________________  ________________________________________________________________________

## 2018-12-07 ENCOUNTER — Other Ambulatory Visit: Payer: Self-pay

## 2018-12-07 ENCOUNTER — Encounter (HOSPITAL_COMMUNITY)
Admission: RE | Admit: 2018-12-07 | Discharge: 2018-12-07 | Disposition: A | Discharge status: Home / Self Care | Payer: Medicaid Other | Source: Ambulatory Visit | Attending: General Surgery | Admitting: General Surgery

## 2018-12-07 ENCOUNTER — Encounter (HOSPITAL_COMMUNITY): Payer: Self-pay

## 2018-12-07 ENCOUNTER — Other Ambulatory Visit (HOSPITAL_COMMUNITY)
Admission: RE | Admit: 2018-12-07 | Discharge: 2018-12-07 | Disposition: A | Discharge status: Home / Self Care | Payer: Medicaid Other | Source: Ambulatory Visit | Attending: General Surgery | Admitting: General Surgery

## 2018-12-07 DIAGNOSIS — Z1159 Encounter for screening for other viral diseases: Secondary | ICD-10-CM | POA: Insufficient documentation

## 2018-12-07 DIAGNOSIS — Z01818 Encounter for other preprocedural examination: Secondary | ICD-10-CM | POA: Insufficient documentation

## 2018-12-07 LAB — CBC
HCT: 41.4 % (ref 36.0–46.0)
Hemoglobin: 13.5 g/dL (ref 12.0–15.0)
MCH: 31.4 pg (ref 26.0–34.0)
MCHC: 32.6 g/dL (ref 30.0–36.0)
MCV: 96.3 fL (ref 80.0–100.0)
Platelets: 247 10*3/uL (ref 150–400)
RBC: 4.3 MIL/uL (ref 3.87–5.11)
RDW: 13.8 % (ref 11.5–15.5)
WBC: 6.5 10*3/uL (ref 4.0–10.5)
nRBC: 0 % (ref 0.0–0.2)

## 2018-12-07 LAB — BASIC METABOLIC PANEL
Anion gap: 7 (ref 5–15)
BUN: 14 mg/dL (ref 6–20)
CO2: 28 mmol/L (ref 22–32)
Calcium: 9.6 mg/dL (ref 8.9–10.3)
Chloride: 104 mmol/L (ref 98–111)
Creatinine, Ser: 0.66 mg/dL (ref 0.44–1.00)
GFR calc Af Amer: >60 mL/min (ref >60)
GFR calc non Af Amer: >60 mL/min (ref >60)
Glucose, Bld: 87 mg/dL (ref 70–99)
Potassium: 4.1 mmol/L (ref 3.5–5.1)
Sodium: 139 mmol/L (ref 135–145)

## 2018-12-07 LAB — SARS CORONAVIRUS 2 (TAT 6-24 HRS): SARS Coronavirus 2: NEGATIVE

## 2018-12-07 NOTE — Consult Note (Signed)
[place] Nurse requested for preoperative stoma site marking  Discussed surgical procedure and stoma creation with patient. She is aware of possible stoma.  Explained role of the [place] nurse team.  Provided the patient with educational booklet and provided samples of pouching options.  Answered patient and questions.   Examined patient sitting, and standing in order to place the marking in the patient's visual field, away from any creases or abdominal contour issues and within the rectus muscle. Marked above the beltline due to the patient wears her jeans very low. She has a large amount of laxity in her skin due to weight loss. Needed to avoid folds related to this.   Marked for colostomy in the LLQ  __3.5__ cm to the left of the umbilicus and _0.3___OZ above the umbilicus.    Patient's abdomen cleansed with CHG wipes at site markings, allowed to air dry prior to marking.Covered mark with thin film transparent dressing to preserve mark until date of surgery.   [place] Nurse team will follow up with patient after surgery for continue ostomy care and teaching.  [place] MSN, [place], [place], [place]

## 2018-12-07 NOTE — Progress Notes (Signed)
Konrad Felix PA  Patient has stopped her ASA per Dr. Selena Batten on 12/06/18.Labs are in [facility]. EKG pending please see on chart

## 2018-12-08 LAB — ABO/RH: ABO/RH(D): A POS

## 2018-12-09 MED ORDER — BUPIVACAINE LIPOSOME 1.3 % IJ SUSP
20.0000 mL | Freq: Once | INTRAMUSCULAR | Status: DC
  Filled 2018-12-09: qty 20

## 2018-12-10 ENCOUNTER — Inpatient Hospital Stay (HOSPITAL_COMMUNITY)
Admission: RE | Admit: 2018-12-10 | Discharge: 2018-12-15 | DRG: 330 | Disposition: A | Discharge status: Home / Self Care | Payer: Medicaid Other | Attending: General Surgery | Admitting: General Surgery

## 2018-12-10 ENCOUNTER — Other Ambulatory Visit: Payer: Self-pay

## 2018-12-10 ENCOUNTER — Encounter (HOSPITAL_COMMUNITY)
Admission: RE | Disposition: A | Discharge status: Home / Self Care | Payer: Self-pay | Source: Home / Self Care | Attending: General Surgery

## 2018-12-10 ENCOUNTER — Inpatient Hospital Stay (HOSPITAL_COMMUNITY): Payer: Medicaid Other | Admitting: Certified Registered"

## 2018-12-10 ENCOUNTER — Encounter (HOSPITAL_COMMUNITY): Payer: Self-pay | Admitting: *Deleted

## 2018-12-10 ENCOUNTER — Inpatient Hospital Stay (HOSPITAL_COMMUNITY): Payer: Medicaid Other | Admitting: Physician Assistant

## 2018-12-10 DIAGNOSIS — Z885 Allergy status to narcotic agent status: Secondary | ICD-10-CM | POA: Diagnosis not present

## 2018-12-10 DIAGNOSIS — N736 Female pelvic peritoneal adhesions (postinfective): Secondary | ICD-10-CM | POA: Diagnosis present

## 2018-12-10 DIAGNOSIS — Z887 Allergy status to serum and vaccine status: Secondary | ICD-10-CM

## 2018-12-10 DIAGNOSIS — F17210 Nicotine dependence, cigarettes, uncomplicated: Secondary | ICD-10-CM | POA: Diagnosis present

## 2018-12-10 DIAGNOSIS — Z923 Personal history of irradiation: Secondary | ICD-10-CM | POA: Diagnosis not present

## 2018-12-10 DIAGNOSIS — Z7982 Long term (current) use of aspirin: Secondary | ICD-10-CM | POA: Diagnosis not present

## 2018-12-10 DIAGNOSIS — C20 Malignant neoplasm of rectum: Secondary | ICD-10-CM | POA: Diagnosis present

## 2018-12-10 DIAGNOSIS — Z79899 Other long term (current) drug therapy: Secondary | ICD-10-CM

## 2018-12-10 DIAGNOSIS — C19 Malignant neoplasm of rectosigmoid junction: Secondary | ICD-10-CM | POA: Diagnosis present

## 2018-12-10 DIAGNOSIS — I10 Essential (primary) hypertension: Secondary | ICD-10-CM | POA: Diagnosis present

## 2018-12-10 DIAGNOSIS — Z91041 Radiographic dye allergy status: Secondary | ICD-10-CM

## 2018-12-10 DIAGNOSIS — I69354 Hemiplegia and hemiparesis following cerebral infarction affecting left non-dominant side: Secondary | ICD-10-CM | POA: Diagnosis not present

## 2018-12-10 DIAGNOSIS — Z9221 Personal history of antineoplastic chemotherapy: Secondary | ICD-10-CM | POA: Diagnosis not present

## 2018-12-10 HISTORY — PX: XI ROBOTIC ASSISTED LOWER ANTERIOR RESECTION: SHX6558

## 2018-12-10 LAB — GLUCOSE, CAPILLARY
Glucose-Capillary: 202 mg/dL — ABNORMAL HIGH (ref 70–99)
Glucose-Capillary: 117 mg/dL — ABNORMAL HIGH (ref 70–99)
Glucose-Capillary: 170 mg/dL — ABNORMAL HIGH (ref 70–99)

## 2018-12-10 LAB — TYPE AND SCREEN
ABO/RH(D): A POS
Antibody Screen: NEGATIVE

## 2018-12-10 LAB — HEMOGLOBIN A1C
Hgb A1c MFr Bld: 4.9 % (ref 4.8–5.6)
Mean Plasma Glucose: 93.93 mg/dL

## 2018-12-10 SURGERY — RESECTION, RECTUM, LOW ANTERIOR, ROBOT-ASSISTED
Anesthesia: General | Site: Abdomen

## 2018-12-10 MED ORDER — SCOPOLAMINE 1 MG/3DAYS TD PT72
1.0000 | MEDICATED_PATCH | TRANSDERMAL | Status: DC
  Administered 2018-12-10: 1 via TRANSDERMAL

## 2018-12-10 MED ORDER — SCOPOLAMINE 1 MG/3DAYS TD PT72
MEDICATED_PATCH | TRANSDERMAL | Status: AC
  Filled 2018-12-10: qty 1

## 2018-12-10 MED ORDER — LIDOCAINE 2% (20 MG/ML) 5 ML SYRINGE
INTRAMUSCULAR | Status: AC
  Filled 2018-12-10: qty 5

## 2018-12-10 MED ORDER — KETAMINE HCL 10 MG/ML IJ SOLN
INTRAMUSCULAR | Status: AC
  Filled 2018-12-10: qty 1

## 2018-12-10 MED ORDER — SUGAMMADEX SODIUM 500 MG/5ML IV SOLN
INTRAVENOUS | Status: AC
  Filled 2018-12-10: qty 5

## 2018-12-10 MED ORDER — LACTATED RINGERS IV SOLN
INTRAVENOUS | Status: DC
  Administered 2018-12-10: 07:00:00 via INTRAVENOUS

## 2018-12-10 MED ORDER — DEXAMETHASONE SODIUM PHOSPHATE 10 MG/ML IJ SOLN
INTRAMUSCULAR | Status: AC
  Filled 2018-12-10: qty 1

## 2018-12-10 MED ORDER — LIDOCAINE HCL 2 % IJ SOLN
INTRAMUSCULAR | Status: AC
  Filled 2018-12-10: qty 20

## 2018-12-10 MED ORDER — PROPOFOL 10 MG/ML IV BOLUS
INTRAVENOUS | Status: AC
  Filled 2018-12-10: qty 40

## 2018-12-10 MED ORDER — ALVIMOPAN 12 MG PO CAPS
12.0000 mg | ORAL_CAPSULE | Freq: Two times a day (BID) | ORAL | Status: DC
  Administered 2018-12-11 – 2018-12-13 (×3): 12 mg via ORAL
  Filled 2018-12-10 (×6): qty 1

## 2018-12-10 MED ORDER — ONDANSETRON HCL 4 MG/2ML IJ SOLN
INTRAMUSCULAR | Status: AC
  Filled 2018-12-10: qty 2

## 2018-12-10 MED ORDER — BUPIVACAINE LIPOSOME 1.3 % IJ SUSP
INTRAMUSCULAR | Status: DC | PRN
  Administered 2018-12-10: 20 mL

## 2018-12-10 MED ORDER — ROCURONIUM BROMIDE 10 MG/ML (PF) SYRINGE
PREFILLED_SYRINGE | INTRAVENOUS | Status: AC
  Filled 2018-12-10: qty 10

## 2018-12-10 MED ORDER — ALBUTEROL SULFATE HFA 108 (90 BASE) MCG/ACT IN AERS
INHALATION_SPRAY | RESPIRATORY_TRACT | Status: DC | PRN
  Administered 2018-12-10: 2 via RESPIRATORY_TRACT

## 2018-12-10 MED ORDER — SUGAMMADEX SODIUM 200 MG/2ML IV SOLN
INTRAVENOUS | Status: DC | PRN
  Administered 2018-12-10: 110 mg via INTRAVENOUS

## 2018-12-10 MED ORDER — INSULIN ASPART 100 UNIT/ML ~~LOC~~ SOLN
0.0000 [IU] | Freq: Three times a day (TID) | SUBCUTANEOUS | Status: DC
  Administered 2018-12-10: 5 [IU] via SUBCUTANEOUS
  Administered 2018-12-14 (×2): 2 [IU] via SUBCUTANEOUS

## 2018-12-10 MED ORDER — GABAPENTIN 300 MG PO CAPS
300.0000 mg | ORAL_CAPSULE | ORAL | Status: AC
  Administered 2018-12-10: 300 mg via ORAL
  Filled 2018-12-10: qty 1

## 2018-12-10 MED ORDER — KETAMINE HCL 10 MG/ML IJ SOLN
INTRAMUSCULAR | Status: DC | PRN
  Administered 2018-12-10: 15 mg via INTRAVENOUS
  Administered 2018-12-10: 30 mg via INTRAVENOUS

## 2018-12-10 MED ORDER — PHENYLEPHRINE 40 MCG/ML (10ML) SYRINGE FOR IV PUSH (FOR BLOOD PRESSURE SUPPORT)
PREFILLED_SYRINGE | INTRAVENOUS | Status: DC | PRN
  Administered 2018-12-10: 80 ug via INTRAVENOUS
  Administered 2018-12-10: 60 ug via INTRAVENOUS
  Administered 2018-12-10 (×2): 80 ug via INTRAVENOUS
  Administered 2018-12-10: 120 ug via INTRAVENOUS
  Administered 2018-12-10: 80 ug via INTRAVENOUS

## 2018-12-10 MED ORDER — FENTANYL CITRATE (PF) 100 MCG/2ML IJ SOLN: 25.0000 ug | INTRAMUSCULAR | Status: DC | PRN

## 2018-12-10 MED ORDER — SACCHAROMYCES BOULARDII 250 MG PO CAPS
250.0000 mg | ORAL_CAPSULE | Freq: Two times a day (BID) | ORAL | Status: DC
  Administered 2018-12-10 – 2018-12-15 (×11): 250 mg via ORAL
  Filled 2018-12-10 (×11): qty 1

## 2018-12-10 MED ORDER — BUPIVACAINE-EPINEPHRINE (PF) 0.25% -1:200000 IJ SOLN
INTRAMUSCULAR | Status: AC
  Filled 2018-12-10: qty 30

## 2018-12-10 MED ORDER — SODIUM CHLORIDE 0.9 % IV SOLN
2.0000 g | INTRAVENOUS | Status: AC
  Administered 2018-12-10: 08:00:00 2 g via INTRAVENOUS
  Filled 2018-12-10: qty 2

## 2018-12-10 MED ORDER — DEXMEDETOMIDINE HCL IN NACL 400 MCG/100ML IV SOLN
INTRAVENOUS | Status: DC | PRN
  Administered 2018-12-10 (×3): 4 ug via INTRAVENOUS

## 2018-12-10 MED ORDER — LIDOCAINE 20MG/ML (2%) 15 ML SYRINGE OPTIME
INTRAMUSCULAR | Status: DC | PRN
  Administered 2018-12-10: 1.5 mg/kg/h via INTRAVENOUS

## 2018-12-10 MED ORDER — DIPHENHYDRAMINE HCL 25 MG PO CAPS
25.0000 mg | ORAL_CAPSULE | Freq: Four times a day (QID) | ORAL | Status: DC | PRN
  Administered 2018-12-10: 25 mg via ORAL
  Filled 2018-12-10: qty 1

## 2018-12-10 MED ORDER — METHYLENE BLUE 0.5 % INJ SOLN
INTRAVENOUS | Status: AC
  Filled 2018-12-10: qty 10

## 2018-12-10 MED ORDER — PHENYLEPHRINE 40 MCG/ML (10ML) SYRINGE FOR IV PUSH (FOR BLOOD PRESSURE SUPPORT)
PREFILLED_SYRINGE | INTRAVENOUS | Status: AC
  Filled 2018-12-10: qty 10

## 2018-12-10 MED ORDER — SODIUM CHLORIDE 0.9 % IR SOLN
Status: DC | PRN
  Administered 2018-12-10: 1000 mL

## 2018-12-10 MED ORDER — ONDANSETRON HCL 4 MG/2ML IJ SOLN
4.0000 mg | Freq: Four times a day (QID) | INTRAMUSCULAR | Status: DC | PRN
  Administered 2018-12-11 – 2018-12-15 (×2): 4 mg via INTRAVENOUS
  Filled 2018-12-10 (×2): qty 2

## 2018-12-10 MED ORDER — FENTANYL CITRATE (PF) 250 MCG/5ML IJ SOLN
INTRAMUSCULAR | Status: AC
  Filled 2018-12-10: qty 5

## 2018-12-10 MED ORDER — ONDANSETRON HCL 4 MG PO TABS
4.0000 mg | ORAL_TABLET | Freq: Four times a day (QID) | ORAL | Status: DC | PRN
  Filled 2018-12-10: qty 1

## 2018-12-10 MED ORDER — FENTANYL CITRATE (PF) 100 MCG/2ML IJ SOLN
INTRAMUSCULAR | Status: DC | PRN
  Administered 2018-12-10 (×4): 50 ug via INTRAVENOUS

## 2018-12-10 MED ORDER — LIDOCAINE 2% (20 MG/ML) 5 ML SYRINGE
INTRAMUSCULAR | Status: DC | PRN
  Administered 2018-12-10: 40 mg via INTRAVENOUS

## 2018-12-10 MED ORDER — SODIUM CHLORIDE 0.9 % IV SOLN
INTRAVENOUS | Status: DC | PRN
  Administered 2018-12-10: 30 ug/min via INTRAVENOUS
  Administered 2018-12-10: 20 ug/min via INTRAVENOUS

## 2018-12-10 MED ORDER — DEXAMETHASONE SODIUM PHOSPHATE 10 MG/ML IJ SOLN
INTRAMUSCULAR | Status: DC | PRN
  Administered 2018-12-10: 10 mg via INTRAVENOUS

## 2018-12-10 MED ORDER — PHENYLEPHRINE HCL (PRESSORS) 10 MG/ML IV SOLN
INTRAVENOUS | Status: AC
  Filled 2018-12-10: qty 1

## 2018-12-10 MED ORDER — ACETAMINOPHEN 500 MG PO TABS
1000.0000 mg | ORAL_TABLET | Freq: Four times a day (QID) | ORAL | Status: DC
  Administered 2018-12-10 – 2018-12-15 (×18): 1000 mg via ORAL
  Filled 2018-12-10 (×19): qty 2

## 2018-12-10 MED ORDER — 0.9 % SODIUM CHLORIDE (POUR BTL) OPTIME
TOPICAL | Status: DC | PRN
  Administered 2018-12-10: 2000 mL

## 2018-12-10 MED ORDER — RINGERS IRRIGATION IR SOLN
Status: DC | PRN
  Administered 2018-12-10: 1000 mL

## 2018-12-10 MED ORDER — METOPROLOL SUCCINATE ER 50 MG PO TB24
50.0000 mg | ORAL_TABLET | Freq: Every day | ORAL | Status: DC
  Administered 2018-12-10 – 2018-12-14 (×4): 50 mg via ORAL
  Filled 2018-12-10 (×5): qty 1

## 2018-12-10 MED ORDER — INSULIN ASPART 100 UNIT/ML ~~LOC~~ SOLN
SUBCUTANEOUS | Status: AC
  Filled 2018-12-10: qty 1

## 2018-12-10 MED ORDER — DIPHENHYDRAMINE HCL 50 MG/ML IJ SOLN: 25.0000 mg | Freq: Four times a day (QID) | INTRAMUSCULAR | Status: DC | PRN

## 2018-12-10 MED ORDER — ALVIMOPAN 12 MG PO CAPS
12.0000 mg | ORAL_CAPSULE | ORAL | Status: AC
  Administered 2018-12-10: 12 mg via ORAL
  Filled 2018-12-10: qty 1

## 2018-12-10 MED ORDER — INSULIN ASPART 100 UNIT/ML ~~LOC~~ SOLN: 0.0000 [IU] | Freq: Every day | SUBCUTANEOUS | Status: DC

## 2018-12-10 MED ORDER — TRAMADOL HCL 50 MG PO TABS
50.0000 mg | ORAL_TABLET | Freq: Four times a day (QID) | ORAL | Status: DC | PRN
  Administered 2018-12-10 – 2018-12-15 (×6): 50 mg via ORAL
  Filled 2018-12-10 (×6): qty 1

## 2018-12-10 MED ORDER — OXYCODONE HCL 5 MG PO TABS: 5.0000 mg | ORAL_TABLET | Freq: Once | ORAL | Status: DC | PRN

## 2018-12-10 MED ORDER — GABAPENTIN 300 MG PO CAPS
300.0000 mg | ORAL_CAPSULE | Freq: Two times a day (BID) | ORAL | Status: DC
  Administered 2018-12-10 – 2018-12-15 (×11): 300 mg via ORAL
  Filled 2018-12-10 (×11): qty 1

## 2018-12-10 MED ORDER — INDOCYANINE GREEN 25 MG IV SOLR
INTRAVENOUS | Status: DC | PRN
  Administered 2018-12-10 (×2): 2.5 mg via INTRAVENOUS

## 2018-12-10 MED ORDER — ENSURE SURGERY PO LIQD
237.0000 mL | Freq: Two times a day (BID) | ORAL | Status: DC
  Administered 2018-12-10 – 2018-12-14 (×8): 237 mL via ORAL
  Filled 2018-12-10 (×11): qty 237

## 2018-12-10 MED ORDER — ALUM & MAG HYDROXIDE-SIMETH 200-200-20 MG/5ML PO SUSP: 30.0000 mL | Freq: Four times a day (QID) | ORAL | Status: DC | PRN

## 2018-12-10 MED ORDER — ONDANSETRON HCL 4 MG/2ML IJ SOLN
INTRAMUSCULAR | Status: DC | PRN
  Administered 2018-12-10: 4 mg via INTRAVENOUS

## 2018-12-10 MED ORDER — SODIUM CHLORIDE 0.9 % IV SOLN
2.0000 g | Freq: Two times a day (BID) | INTRAVENOUS | Status: AC
  Administered 2018-12-10: 2 g via INTRAVENOUS
  Filled 2018-12-10: qty 2

## 2018-12-10 MED ORDER — ESMOLOL HCL 100 MG/10ML IV SOLN
INTRAVENOUS | Status: DC | PRN
  Administered 2018-12-10: 20 mg via INTRAVENOUS
  Administered 2018-12-10 (×2): 30 mg via INTRAVENOUS
  Administered 2018-12-10: 20 mg via INTRAVENOUS

## 2018-12-10 MED ORDER — MIDAZOLAM HCL 2 MG/2ML IJ SOLN
INTRAMUSCULAR | Status: AC
  Filled 2018-12-10: qty 2

## 2018-12-10 MED ORDER — SUCCINYLCHOLINE CHLORIDE 200 MG/10ML IV SOSY
PREFILLED_SYRINGE | INTRAVENOUS | Status: AC
  Filled 2018-12-10: qty 10

## 2018-12-10 MED ORDER — EPHEDRINE 5 MG/ML INJ
INTRAVENOUS | Status: AC
  Filled 2018-12-10: qty 10

## 2018-12-10 MED ORDER — BUPIVACAINE-EPINEPHRINE (PF) 0.25% -1:200000 IJ SOLN
INTRAMUSCULAR | Status: DC | PRN
  Administered 2018-12-10: 30 mL

## 2018-12-10 MED ORDER — KCL IN DEXTROSE-NACL 20-5-0.45 MEQ/L-%-% IV SOLN
INTRAVENOUS | Status: DC
  Administered 2018-12-10 – 2018-12-14 (×4): via INTRAVENOUS
  Filled 2018-12-10 (×7): qty 1000

## 2018-12-10 MED ORDER — SUCCINYLCHOLINE CHLORIDE 200 MG/10ML IV SOSY
PREFILLED_SYRINGE | INTRAVENOUS | Status: AC
  Filled 2018-12-10: qty 20

## 2018-12-10 MED ORDER — ROCURONIUM BROMIDE 10 MG/ML (PF) SYRINGE
PREFILLED_SYRINGE | INTRAVENOUS | Status: DC | PRN
  Administered 2018-12-10: 10 mg via INTRAVENOUS
  Administered 2018-12-10: 50 mg via INTRAVENOUS
  Administered 2018-12-10: 20 mg via INTRAVENOUS
  Administered 2018-12-10: 30 mg via INTRAVENOUS
  Administered 2018-12-10: 10 mg via INTRAVENOUS

## 2018-12-10 MED ORDER — EPHEDRINE SULFATE-NACL 50-0.9 MG/10ML-% IV SOSY
PREFILLED_SYRINGE | INTRAVENOUS | Status: DC | PRN
  Administered 2018-12-10: 5 mg via INTRAVENOUS

## 2018-12-10 MED ORDER — LABETALOL HCL 5 MG/ML IV SOLN
INTRAVENOUS | Status: DC | PRN
  Administered 2018-12-10: 5 mg via INTRAVENOUS

## 2018-12-10 MED ORDER — ONDANSETRON HCL 4 MG/2ML IJ SOLN: 4.0000 mg | Freq: Four times a day (QID) | INTRAMUSCULAR | Status: DC | PRN

## 2018-12-10 MED ORDER — SUCCINYLCHOLINE CHLORIDE 200 MG/10ML IV SOSY
PREFILLED_SYRINGE | INTRAVENOUS | Status: DC | PRN
  Administered 2018-12-10: 100 mg via INTRAVENOUS

## 2018-12-10 MED ORDER — HYDROMORPHONE HCL 1 MG/ML IJ SOLN
0.5000 mg | INTRAMUSCULAR | Status: DC | PRN
  Administered 2018-12-12 – 2018-12-14 (×2): 0.5 mg via INTRAVENOUS
  Filled 2018-12-10 (×2): qty 0.5

## 2018-12-10 MED ORDER — DEXAMETHASONE SODIUM PHOSPHATE 10 MG/ML IJ SOLN
INTRAMUSCULAR | Status: AC
  Filled 2018-12-10: qty 2

## 2018-12-10 MED ORDER — ENOXAPARIN SODIUM 40 MG/0.4ML ~~LOC~~ SOLN
40.0000 mg | SUBCUTANEOUS | Status: DC
  Administered 2018-12-11 – 2018-12-15 (×5): 40 mg via SUBCUTANEOUS
  Filled 2018-12-10 (×4): qty 0.4

## 2018-12-10 MED ORDER — LISINOPRIL 5 MG PO TABS
5.0000 mg | ORAL_TABLET | Freq: Every day | ORAL | Status: DC
  Administered 2018-12-10 – 2018-12-14 (×4): 5 mg via ORAL
  Filled 2018-12-10 (×5): qty 1

## 2018-12-10 MED ORDER — OXYCODONE HCL 5 MG/5ML PO SOLN: 5.0000 mg | Freq: Once | ORAL | Status: DC | PRN

## 2018-12-10 MED ORDER — ESMOLOL HCL 100 MG/10ML IV SOLN
INTRAVENOUS | Status: AC
  Filled 2018-12-10: qty 20

## 2018-12-10 MED ORDER — PROPOFOL 10 MG/ML IV BOLUS
INTRAVENOUS | Status: DC | PRN
  Administered 2018-12-10: 50 mg via INTRAVENOUS
  Administered 2018-12-10: 100 mg via INTRAVENOUS

## 2018-12-10 MED ORDER — ACETAMINOPHEN 500 MG PO TABS
1000.0000 mg | ORAL_TABLET | ORAL | Status: AC
  Administered 2018-12-10: 1000 mg via ORAL
  Filled 2018-12-10: qty 2

## 2018-12-10 MED ORDER — LACTATED RINGERS IV SOLN
INTRAVENOUS | Status: DC | PRN
  Administered 2018-12-10 (×2): via INTRAVENOUS

## 2018-12-10 MED ORDER — MIDAZOLAM HCL 5 MG/5ML IJ SOLN
INTRAMUSCULAR | Status: DC | PRN
  Administered 2018-12-10: 2 mg via INTRAVENOUS

## 2018-12-10 MED ORDER — ATORVASTATIN CALCIUM 40 MG PO TABS
80.0000 mg | ORAL_TABLET | Freq: Every day | ORAL | Status: DC
  Administered 2018-12-10 – 2018-12-14 (×5): 80 mg via ORAL
  Filled 2018-12-10 (×5): qty 2

## 2018-12-10 MED ORDER — FENTANYL CITRATE (PF) 100 MCG/2ML IJ SOLN
INTRAMUSCULAR | Status: AC
  Filled 2018-12-10: qty 4

## 2018-12-10 SURGICAL SUPPLY — 104 items
ADH SKN CLS APL DERMABOND .7 (GAUZE/BANDAGES/DRESSINGS) ×1
BLADE EXTENDED COATED 6.5IN (ELECTRODE) IMPLANT
CANNULA REDUC XI 12-8 STAPL (CANNULA) ×1
CANNULA REDUC XI 12-8MM STAPL (CANNULA) ×1
CANNULA REDUCER 12-8 DVNC XI (CANNULA) ×1 IMPLANT
CATH ROBINSON RED A/P 16FR (CATHETERS) ×2 IMPLANT
CELLS DAT CNTRL 66122 CELL SVR (MISCELLANEOUS) IMPLANT
CLIP VESOLOCK LG 6/CT PURPLE (CLIP) IMPLANT
CLIP VESOLOCK MED 6/CT (CLIP) IMPLANT
COVER SURGICAL LIGHT HANDLE (MISCELLANEOUS) ×6 IMPLANT
COVER TIP SHEARS 8 DVNC (MISCELLANEOUS) ×1 IMPLANT
COVER TIP SHEARS 8MM DA VINCI (MISCELLANEOUS) ×2
COVER WAND RF STERILE (DRAPES) ×3 IMPLANT
DECANTER SPIKE VIAL GLASS SM (MISCELLANEOUS) ×2 IMPLANT
DERMABOND ADVANCED (GAUZE/BANDAGES/DRESSINGS) ×2
DERMABOND ADVANCED .7 DNX12 (GAUZE/BANDAGES/DRESSINGS) IMPLANT
DRAIN CHANNEL 19F RND (DRAIN) ×2 IMPLANT
DRAPE ARM DVNC X/XI (DISPOSABLE) ×4 IMPLANT
DRAPE COLUMN DVNC XI (DISPOSABLE) ×1 IMPLANT
DRAPE DA VINCI XI ARM (DISPOSABLE) ×8
DRAPE DA VINCI XI COLUMN (DISPOSABLE) ×2
DRAPE SURG IRRIG POUCH 19X23 (DRAPES) ×3 IMPLANT
DRSG OPSITE POSTOP 4X10 (GAUZE/BANDAGES/DRESSINGS) IMPLANT
DRSG OPSITE POSTOP 4X6 (GAUZE/BANDAGES/DRESSINGS) ×2 IMPLANT
DRSG OPSITE POSTOP 4X8 (GAUZE/BANDAGES/DRESSINGS) IMPLANT
ELECT PENCIL ROCKER SW 15FT (MISCELLANEOUS) ×3 IMPLANT
ELECT REM PT RETURN 15FT ADLT (MISCELLANEOUS) ×3 IMPLANT
ENDOLOOP SUT PDS II  0 18 (SUTURE)
ENDOLOOP SUT PDS II 0 18 (SUTURE) IMPLANT
EVACUATOR SILICONE 100CC (DRAIN) ×2 IMPLANT
GAUZE SPONGE 4X4 12PLY STRL (GAUZE/BANDAGES/DRESSINGS) IMPLANT
GLOVE BIO SURGEON STRL SZ 6.5 (GLOVE) ×6 IMPLANT
GLOVE BIO SURGEONS STRL SZ 6.5 (GLOVE) ×3
GLOVE BIOGEL PI IND STRL 7.0 (GLOVE) ×3 IMPLANT
GLOVE BIOGEL PI INDICATOR 7.0 (GLOVE) ×6
GOWN STRL REUS W/TWL 2XL LVL3 (GOWN DISPOSABLE) ×9 IMPLANT
GOWN STRL REUS W/TWL XL LVL3 (GOWN DISPOSABLE) ×12 IMPLANT
GRASPER ENDOPATH ANVIL 10MM (MISCELLANEOUS) IMPLANT
GRASPER SUT TROCAR 14GX15 (MISCELLANEOUS) IMPLANT
HOLDER FOLEY CATH W/STRAP (MISCELLANEOUS) ×3 IMPLANT
IRRIG SUCT STRYKERFLOW 2 WTIP (MISCELLANEOUS) ×3
IRRIGATION SUCT STRKRFLW 2 WTP (MISCELLANEOUS) ×1 IMPLANT
IRRIGATOR SUCT 8 DISP DVNC XI (IRRIGATION / IRRIGATOR) IMPLANT
IRRIGATOR SUCTION 8MM XI DISP (IRRIGATION / IRRIGATOR)
KIT PROCEDURE DA VINCI SI (MISCELLANEOUS) ×2
KIT PROCEDURE DVNC SI (MISCELLANEOUS) ×1 IMPLANT
KIT TURNOVER KIT A (KITS) ×2 IMPLANT
NDL INSUFFLATION 14GA 120MM (NEEDLE) ×1 IMPLANT
NEEDLE INSUFFLATION 14GA 120MM (NEEDLE) ×3 IMPLANT
PACK CARDIOVASCULAR III (CUSTOM PROCEDURE TRAY) ×3 IMPLANT
PACK COLON (CUSTOM PROCEDURE TRAY) ×3 IMPLANT
PORT LAP GEL ALEXIS MED 5-9CM (MISCELLANEOUS) ×3 IMPLANT
RELOAD STAPLE 45 BLU REG DVNC (STAPLE) IMPLANT
RELOAD STAPLE 45 GRN THCK DVNC (STAPLE) IMPLANT
RETRACTOR WND ALEXIS 18 MED (MISCELLANEOUS) IMPLANT
RTRCTR WOUND ALEXIS 18CM MED (MISCELLANEOUS)
SCISSORS LAP 5X35 DISP (ENDOMECHANICALS) ×3 IMPLANT
SEAL CANN UNIV 5-8 DVNC XI (MISCELLANEOUS) ×3 IMPLANT
SEAL XI 5MM-8MM UNIVERSAL (MISCELLANEOUS) ×8
SEALER VESSEL DA VINCI XI (MISCELLANEOUS) ×2
SEALER VESSEL EXT DVNC XI (MISCELLANEOUS) ×1 IMPLANT
SLEEVE ADV FIXATION 5X100MM (TROCAR) IMPLANT
SOLUTION ELECTROLUBE (MISCELLANEOUS) ×3 IMPLANT
SPONGE DRAIN TRACH 4X4 STRL 2S (GAUZE/BANDAGES/DRESSINGS) ×2 IMPLANT
STAPLER 45 BLU RELOAD XI (STAPLE) ×2 IMPLANT
STAPLER 45 BLUE RELOAD XI (STAPLE) ×4
STAPLER 45 GREEN RELOAD XI (STAPLE)
STAPLER 45 GRN RELOAD XI (STAPLE) IMPLANT
STAPLER CANNULA SEAL DVNC XI (STAPLE) ×1 IMPLANT
STAPLER CANNULA SEAL XI (STAPLE) ×2
STAPLER ECHELON POWER CIR 29 (STAPLE) ×2 IMPLANT
STAPLER SHEATH (SHEATH) ×2
STAPLER SHEATH ENDOWRIST DVNC (SHEATH) ×1 IMPLANT
STAPLER VISISTAT 35W (STAPLE) IMPLANT
SUT ETHILON 2 0 PS N (SUTURE) ×2 IMPLANT
SUT NOVA NAB DX-16 0-1 5-0 T12 (SUTURE) ×6 IMPLANT
SUT PROLENE 2 0 KS (SUTURE) ×3 IMPLANT
SUT SILK 2 0 (SUTURE) ×3
SUT SILK 2 0 SH CR/8 (SUTURE) IMPLANT
SUT SILK 2-0 18XBRD TIE 12 (SUTURE) ×1 IMPLANT
SUT SILK 3 0 (SUTURE)
SUT SILK 3 0 SH CR/8 (SUTURE) ×3 IMPLANT
SUT SILK 3-0 18XBRD TIE 12 (SUTURE) IMPLANT
SUT V-LOC BARB 180 2/0GR6 GS22 (SUTURE)
SUT VIC AB 2-0 SH 18 (SUTURE) ×4 IMPLANT
SUT VIC AB 2-0 SH 27 (SUTURE) ×3
SUT VIC AB 2-0 SH 27X BRD (SUTURE) ×1 IMPLANT
SUT VIC AB 3-0 SH 18 (SUTURE) ×2 IMPLANT
SUT VIC AB 3-0 SH 8-18 (SUTURE) ×2 IMPLANT
SUT VIC AB 4-0 PS2 27 (SUTURE) ×6 IMPLANT
SUT VICRYL 0 UR6 27IN ABS (SUTURE) ×3 IMPLANT
SUTURE V-LC BRB 180 2/0GR6GS22 (SUTURE) IMPLANT
SYR 10ML ECCENTRIC (SYRINGE) ×3 IMPLANT
SYS LAPSCP GELPORT 120MM (MISCELLANEOUS)
SYSTEM LAPSCP GELPORT 120MM (MISCELLANEOUS) IMPLANT
TAPE CLOTH SURG 4X10 WHT LF (GAUZE/BANDAGES/DRESSINGS) ×2 IMPLANT
TOWEL OR 17X26 10 PK STRL BLUE (TOWEL DISPOSABLE) IMPLANT
TOWEL OR NON WOVEN STRL DISP B (DISPOSABLE) ×3 IMPLANT
TRAY FOLEY MTR SLVR 14FR STAT (SET/KITS/TRAYS/PACK) ×2 IMPLANT
TROCAR ADV FIXATION 5X100MM (TROCAR) ×3 IMPLANT
TROCAR BLADELESS OPT 5 100 (ENDOMECHANICALS) ×2 IMPLANT
TUBING CONNECTING 10 (TUBING) ×4 IMPLANT
TUBING CONNECTING 10' (TUBING) ×2
TUBING INSUFFLATION 10FT LAP (TUBING) ×3 IMPLANT

## 2018-12-10 NOTE — H&P (Addendum)
The patient is a 48 year old female who presents with colorectal cancer. 48 year old female who presents to the office for evaluation of a rectal cancer. Patient underwent a carotid endarterectomy late last year. She was placed on anticoagulation and developed rectal bleeding. She underwent a colonoscopy in January 2020 which showed a distal rectal mass. A port was placed and she was started on chemotherapy and radiation at [facility]. She smokes approximately a pack a day of cigarettes. She lost some weight with chemotherapy and radiation but has gained about 50% of the back. Past abdominal surgical history consist of a oophorectomy and cholecystectomy. Patient has a history of a stroke with some hemiparesis of the left side.   Diagnostic Studies History (Tanisha A. Owens Shark, [place]; 10/13/2018 9:59 AM) Colonoscopy within last year Mammogram 1-3 years ago Pap Smear 1-5 years ago  Allergies (Tanisha A. Owens Shark, [place]; 10/13/2018 10:02 AM) Codeine Sulfate *ANALGESICS - OPIOID* Morphine Sulfate *ANALGESICS - OPIOID* Dyes Contrast Influenza Virus Vaccine *VACCINES* Shellfish Allergies Reconciled  Medication History (Tanisha A. Owens Shark, [place]; 10/13/2018 10:03 AM) Atorvastatin Calcium (80MG  Tablet, Oral) Active. Lisinopril (5MG  Tablet, Oral) Active. Ondansetron HCl (4MG  Tablet, Oral) Active. Aspirin (81MG  Tablet, Oral) Active. Lipitor (40MG  Tablet, Oral) Active. Metoprolol Tartrate (50MG  Tablet, Oral) Active. MiraLax (Oral) Active. Medications Reconciled  Social History (Tanisha A. Owens Shark, [place]; 10/13/2018 9:59 AM) Alcohol use Remotely quit alcohol use. Caffeine use Coffee, Tea. No drug use Tobacco use Current every day smoker.  Family History (Tanisha A. Owens Shark, [place]; 10/13/2018 9:59 AM) Hypertension Father, Mother. Respiratory Condition Father.  Pregnancy / Birth History (Tanisha A. Owens Shark, RMA; 10/13/2018 9:59 AM) Age at menarche 55 years. Age of menopause  [age] Gravida 3 Irregular periods Length (months) of breastfeeding 7-12 Maternal age 69-20 Para 3  Other Problems (Tanisha A. Owens Shark, [place]; 10/13/2018 9:59 AM) Gastroesophageal Reflux Disease High blood pressure Oophorectomy     Review of Systems  General Present- Weight Loss. Not Present- Appetite Loss, Chills, Fatigue, Fever, Night Sweats and Weight Gain. Skin Not Present- Change in Wart/Mole, Dryness, Hives, Jaundice, New Lesions, Non-Healing Wounds, Rash and Ulcer. HEENT Not Present- Earache, Hearing Loss, Hoarseness, Nose Bleed, Oral Ulcers, Ringing in the Ears, Seasonal Allergies, Sinus Pain, Sore Throat, Visual Disturbances, Wears glasses/contact lenses and Yellow Eyes. Respiratory Not Present- Bloody sputum, Chronic Cough, Difficulty Breathing, Snoring and Wheezing. Breast Not Present- Breast Mass, Breast Pain, Nipple Discharge and Skin Changes. Cardiovascular Not Present- Chest Pain, Difficulty Breathing Lying Down, Leg Cramps, Palpitations, Rapid Heart Rate, Shortness of Breath and Swelling of Extremities. Gastrointestinal Not Present- Abdominal Pain, Bloating, Bloody Stool, Change in Bowel Habits, Chronic diarrhea, Constipation, Difficulty Swallowing, Excessive gas, Gets full quickly at meals, Hemorrhoids, Indigestion, Nausea, Rectal Pain and Vomiting. Female Genitourinary Not Present- Frequency, Nocturia, Painful Urination, Pelvic Pain and Urgency. Musculoskeletal Not Present- Back Pain, Joint Pain, Joint Stiffness, Muscle Pain, Muscle Weakness and Swelling of Extremities. Neurological Not Present- Decreased Memory, Fainting, Headaches, Numbness, Seizures, Tingling, Tremor, Trouble walking and Weakness. Psychiatric Not Present- Anxiety, Bipolar, Change in Sleep Pattern, Depression, Fearful and Frequent crying. Endocrine Not Present- Cold Intolerance, Excessive Hunger, Hair Changes, Heat Intolerance, Hot flashes and New Diabetes.  BP 129/69   Pulse 87   Temp 97.8  F (36.6 C) (Oral)   Resp 16   SpO2 95%        Physical Exam   General Mental Status-Alert. General Appearance-Not in acute distress. Build & Nutrition-Well nourished. Posture-Normal posture. Gait-Normal.  Head and Neck Head-normocephalic, atraumatic with no lesions or palpable  masses. Trachea-midline.  Chest and Lung Exam Chest and lung exam reveals -on auscultation, normal breath sounds, no adventitious sounds and normal vocal resonance.  Cardiovascular Cardiovascular examination reveals -normal heart sounds, regular rate and rhythm with no murmurs and no digital clubbing, cyanosis, edema, increased warmth or tenderness.  Abdomen Inspection Inspection of the abdomen reveals - No Hernias. Palpation/Percussion Palpation and Percussion of the abdomen reveal - Soft, Non Tender, No Rigidity (guarding), No hepatosplenomegaly and No Palpable abdominal masses.  Rectal Anorectal Exam External - Note: mild anal gaping. Internal - Note: mass palpated posterior midline above coccyx (~6cm).  Neurologic Neurologic evaluation reveals -alert and oriented x 3 with no impairment of recent or remote memory, normal attention span and ability to concentrate, normal sensation and normal coordination.  Musculoskeletal Normal Exam - Bilateral-Upper Extremity Strength Normal and Lower Extremity Strength Normal.    Assessment & Plan  RECTAL CANCER (C20) Impression: 48 year old female who presents to the office with complaints of a newly diagnosed rectal cancer. This was noted to be in the distal rectum on colonoscopy. We will obtain her colonoscopy report as well as her CT scans. She completed chemotherapy and radiation approximately 2 weeks ago. We discussed surgical resection using a robotic approach. We discussed the need for a temporary ostomy. We discussed that if she does not stop smoking we will have to perform a permanent colostomy. She did  agree to stop smoking. She states she has had 6 cigarettes in the last 10 wks.  We discussed the change in bowel habits after surgery and that she will most likely have multiple bowel movements with pain and sometimes fecal leakage.  I have reviewed the outside colonoscopy report and CT scans.   The surgery and anatomy were described to the patient as well as the risks of surgery and the possible complications. These include: Bleeding, deep abdominal infections and possible wound complications such as hernia and infection, damage to adjacent structures, leak of surgical connections, which can lead to other surgeries and possibly an ostomy, possible need for other procedures, such as abscess drains in radiology, possible prolonged hospital stay, possible diarrhea from removal of part of the colon, possible constipation from narcotics, possible bowel, bladder or sexual dysfunction if having rectal surgery, prolonged fatigue/weakness or appetite loss, possible early recurrence of of disease, possible complications of their medical problems such as heart disease or arrhythmias or lung problems, death (less than 1%). I believe the patient understands and wishes to proceed with the surgery.

## 2018-12-10 NOTE — Anesthesia Postprocedure Evaluation (Signed)
Anesthesia Post Note  Patient: Sun Kihn  Procedure(s) Performed: XI ROBOTIC ASSISTED LOWER ANTERIOR RESECTION WITH LOOP ILEOSTOMY (N/A Abdomen)     Patient location during evaluation: PACU Anesthesia Type: General Level of consciousness: awake and alert Pain management: pain level controlled Vital Signs Assessment: post-procedure vital signs reviewed and stable Respiratory status: spontaneous breathing, nonlabored ventilation, respiratory function stable and patient connected to nasal cannula oxygen Cardiovascular status: blood pressure returned to baseline and stable Postop Assessment: no apparent nausea or vomiting Anesthetic complications: no    Last Vitals:  Vitals:   12/10/18 1230 12/10/18 1247  BP: 136/80 140/83  Pulse: 80 80  Resp: 11 14  Temp: 36.7 C 36.4 C  SpO2: 100% 99%    Last Pain:  Vitals:   12/10/18 1247  TempSrc: Oral  PainSc:                  Travis S

## 2018-12-10 NOTE — Anesthesia Procedure Notes (Signed)
Procedure Name: Intubation Date/Time: 12/10/2018 7:39 AM Performed by: Silas [place], CRNA Pre-anesthesia Checklist: Patient identified, Emergency Drugs available, Suction available and Patient being monitored Patient Re-evaluated:Patient Re-evaluated prior to induction Oxygen Delivery Method: Circle system utilized Preoxygenation: Pre-oxygenation with 100% oxygen Induction Type: IV induction Ventilation: Mask ventilation without difficulty Laryngoscope Size: Mac and 4 Grade View: Grade I Tube type: Oral Tube size: 7.0 mm Number of attempts: 1 Airway Equipment and Method: Stylet Placement Confirmation: ETT inserted through vocal cords under direct vision,  positive ETCO2 and breath sounds checked- equal and bilateral Secured at: 23 cm Tube secured with: Tape Dental Injury: Teeth and Oropharynx as per pre-operative assessment

## 2018-12-10 NOTE — Op Note (Signed)
12/10/2018  10:51 AM  PATIENT:  Jill Williams  48 y.o. female  Patient Care Team: Maris Berger, MD as PCP - General (Family Medicine) Maris Berger, MD (Family Medicine)  PRE-OPERATIVE DIAGNOSIS:  Distal rectal cancer  POST-OPERATIVE DIAGNOSIS:  Distal rectal cancer  PROCEDURE:   XI ROBOTIC ASSISTED LOWER ANTERIOR RESECTION WITH COLOANAL ANASTOMOSIS AND LOOP ILEOSTOMY    Surgeon(s): Leighton Ruff, MD Ileana Roup, MD  ASSISTANT: Dr Dema Severin   ANESTHESIA:   local and general  EBL: 28ml  Total I/O In: 1000 [I.V.:1000] Out: 125 [Urine:50; Blood:75]  Delay start of Pharmacological VTE agent (>24hrs) due to surgical blood loss or risk of bleeding:  no  DRAINS: (18F) Jackson-Pratt drain(s) with closed bulb suction in the pelvis   SPECIMEN:  Source of Specimen:  Rectum and sigmoid colon  DISPOSITION OF SPECIMEN:  PATHOLOGY  COUNTS:  YES  PLAN OF CARE: Admit to inpatient   PATIENT DISPOSITION:  PACU - hemodynamically stable.  INDICATION:    48 y.o. F with distal rectal cancer s/p neoadjuvant ChemoRT.  I recommended low anterior resection:  The anatomy & physiology of the digestive tract was discussed.  The pathophysiology was discussed.  Natural history risks without surgery was discussed.   I worked to give an overview of the disease and the frequent need to have multispecialty involvement.  I feel the risks of no intervention will lead to serious problems that outweigh the operative risks; therefore, I recommended a partial colectomy to remove the pathology.  Laparoscopic & open techniques were discussed.   Risks such as bleeding, infection, abscess, leak, reoperation, possible ostomy, hernia, heart attack, death, and other risks were discussed.  I noted a good likelihood this will help address the problem.   Goals of post-operative recovery were discussed as well.    The patient expressed understanding & wished to proceed with surgery.  OR FINDINGS:   Patient  had R posterior lateral distal rectal cancer ~5 cm from anal verge  No obvious metastatic disease on visceral parietal peritoneum or liver.  The anastomosis rests 0 cm from the anal verge by rigid proctoscopy.  DESCRIPTION:   Informed consent was confirmed.  The patient underwent general anaesthesia without difficulty.  The patient was positioned appropriately.  VTE prevention in place.  The patient's abdomen was clipped, prepped, & draped in a sterile fashion.  Surgical timeout confirmed our plan.  The patient was positioned in reverse Trendelenburg.  Abdominal entry was gained using a Varies needle.  Entry was clean.  I induced carbon dioxide insufflation.  An 19mm robotic port was placed in the RLQ.  Camera inspection revealed no injury.  Extra ports were carefully placed under direct laparoscopic visualization.  I laparoscopically reflected the greater omentum and the upper abdomen the small bowel in the upper abdomen. The patient was appropriately positioned and the robot was docked to the patient's left side.  Instruments were placed under direct visualization.    I mobilized the sigmoid colon off of the pelvic sidewall.  I scored the base of peritoneum of the right side of the mesentery of the left colon from the ligament of Treitz to the peritoneal reflection of the mid rectum.  The patient had a long mesentery.  I elevated the sigmoid mesentery and enetered into the retro-mesenteric plane. We were able to identify the left ureter and gonadal vessels. We kept those posterior within the retroperitoneum and elevated the left colon mesentery off that. I did isolated IMA pedicle but  did not ligate it yet.  I continued distally and got into the avascular plane posterior to the mesorectum. This allowed me to help mobilize the rectum as well by freeing the mesorectum off the sacrum.  I mobilized the peritoneal coverings towards the peritoneal reflection on both the right and left sides of the rectum.  I  could see the right and left ureters and stayed away from them.  I continued my dissection down posteriorly to the level of the pelvic floor.  This was done using electrocautery and scissors.  I continued up the lateral sidewalls dissecting the mesorectum away from the surrounding pelvic structures.  I continued my dissection around the peritoneal reflection.  I identified the rectovaginal septum and dissected between these 2 organs.  I was able to dissect down to the pelvic floor anteriorly as well.  Once all adhesions were freed from the pelvic floor I evaluated the rectum with my finger.  There appeared to be at least a 1 cm margin.  I switched to the robotic vessel sealer.   I skeletonized the inferior mesenteric artery pedicle.  I went down to its takeoff from the aorta.   I isolated the inferior mesenteric vein off of the ligament of Treitz just cephalad to that as well.  After confirming the left ureter was out of the way, I went ahead and ligated the inferior mesenteric artery pedicle with bipolar robotic vessel sealer ~2cm above its takeoff from the aorta.   I did ligate the inferior mesenteric vein in a similar fashion.  We ensured hemostasis. I skeletonized the mesorectum at the junction at the proximal rectum using blunt dissection & bipolar robotic vessel sealer.  I mobilized the left colon in a lateral to medial fashion off the line of Toldt up towards the splenic flexure to ensure good mobilization of the left colon to reach into the pelvis.  I divided the remaining mesentery to the level of the descending colon sigmoid junction.  We then injected firefly intravenously and evaluated for intra-vascular perfusion.  There was good perfusion of the ICG dye to the level of my previous resection.  I then transected the rectum from the anal canal using a robotic blue load stapler x2.  We then made a small Pfannenstiel incision after the robot was undocked.  An [place] wound protector was placed.  The  rectum was brought out through this incision and the proximal margin was dissected over a pursestring suture device.  A 2-0 Prolene pursestring suture was placed.  The colon was then transected using scissors.  The margins appeared viable.  The specimen was sent to pathology for further examination.  A 29 mm EEA anvil was then placed into the colon and the pursestring was tied tightly around this.  This was then placed back into the abdomen and anastomosis was created using the EEA stapler under direct laparoscopic visualization.  The anastomosis rests at the pelvic floor.  A 19 [place] Blake drain was then placed in the pelvis and brought out through the right lower quadrant port site.  We identified the terminal ileum and this was brought out through the previously marked ostomy site in the right upper quadrant.  We evaluated this laparoscopically and it did appear twisted.  I was able to get it untwisted and placed with the distal portion of the bowel posteriorly.  We then removed the camera and insufflation.  We switched to clean gowns, gloves, instruments and drapes.  The Pfannenstiel peritoneum was closed using  a running 2-0 Vicryl suture.  The fascia was then closed using interrupted #1 Novafil sutures.  The subcutaneous tissue was reapproximated using interrupted 3-0 Vicryl sutures.  The skin was closed using a running 4-0 subcuticular suture.  Sterile dressing was applied.  The port sites were also closed using a 4-0 Vicryl subcuticular suture and Dermabond.  The ileostomy was then matured in standard Brooke fashion using interrupted 2-0 Vicryl sutures.  An ostomy appliance was placed.  Patient was then awakened from anesthesia and sent to the postanesthesia care unit in stable condition.  All counts were correct per operating room staff.  An MD assistant was necessary for tissue manipulation, retraction and positioning due to the complexity of the case and hospital policies

## 2018-12-10 NOTE — Anesthesia Preprocedure Evaluation (Signed)
Anesthesia Evaluation  Patient identified by MRN, date of birth, ID band Patient awake    Reviewed: Allergy & Precautions, H&P , NPO status , Patient's Chart, lab work & pertinent test results  Airway Mallampati: II   Neck ROM: full    Dental   Pulmonary former smoker,    breath sounds clear to auscultation       Cardiovascular hypertension,  Rhythm:regular Rate:Normal     Neuro/Psych TIACVA    GI/Hepatic Rectal CA   Endo/Other    Renal/GU      Musculoskeletal   Abdominal   Peds  Hematology   Anesthesia Other Findings   Reproductive/Obstetrics                             Anesthesia Physical Anesthesia Plan  ASA: III  Anesthesia Plan: General   Post-op Pain Management:    Induction: Intravenous  PONV Risk Score and Plan: 3 and Ondansetron, Dexamethasone, Midazolam and Treatment may vary due to age or medical condition  Airway Management Planned: Oral ETT  Additional Equipment:   Intra-op Plan:   Post-operative Plan: Extubation in OR  Informed Consent: I have reviewed the patients History and Physical, chart, labs and discussed the procedure including the risks, benefits and alternatives for the proposed anesthesia with the patient or authorized representative who has indicated his/her understanding and acceptance.       Plan Discussed with: CRNA, Anesthesiologist and Surgeon  Anesthesia Plan Comments:         Anesthesia Quick Evaluation

## 2018-12-10 NOTE — Transfer of Care (Signed)
Immediate Anesthesia Transfer of Care Note  Patient: Jill Williams  Procedure(s) Performed: XI ROBOTIC ASSISTED LOWER ANTERIOR RESECTION WITH LOOP ILEOSTOMY (N/A Abdomen)  Patient Location: PACU  Anesthesia Type:General  Level of Consciousness: drowsy and patient cooperative  Airway & Oxygen Therapy: Patient Spontanous Breathing and Patient connected to face mask oxygen  Post-op Assessment: Report given to RN and Post -op Vital signs reviewed and stable  Post vital signs: Reviewed and stable  Last Vitals:  Vitals Value Taken Time  BP 166/106 12/10/18 1119  Temp    Pulse 73 12/10/18 1123  Resp 22 12/10/18 1123  SpO2 100 % 12/10/18 1123  Vitals shown include unvalidated device data.  Last Pain:  Vitals:   12/10/18 0551  TempSrc: Oral         Complications: No apparent anesthesia complications

## 2018-12-10 NOTE — Progress Notes (Signed)
Patient walked to the bathroom to wash up from surgery. After standing for approximately 5 minutes she stated she felt as if she were going to pass out. We assisted the patient back to bed. Patient was also taught how to properly work the incentive spirometer, she struggled to reach 500, needs reinforcement.

## 2018-12-10 NOTE — Progress Notes (Signed)
Pt came in in wheelchair, unable to walk distance . Left arm spastic. Rash bilateral arms and ankles "from roommates new dogs having fleas" "have been treated now."

## 2018-12-11 ENCOUNTER — Encounter (HOSPITAL_COMMUNITY): Payer: Self-pay | Admitting: General Surgery

## 2018-12-11 LAB — BASIC METABOLIC PANEL
Anion gap: 8 (ref 5–15)
BUN: 14 mg/dL (ref 6–20)
CO2: 26 mmol/L (ref 22–32)
Calcium: 8.8 mg/dL — ABNORMAL LOW (ref 8.9–10.3)
Chloride: 105 mmol/L (ref 98–111)
Creatinine, Ser: 0.56 mg/dL (ref 0.44–1.00)
GFR calc Af Amer: >60 mL/min (ref >60)
GFR calc non Af Amer: >60 mL/min (ref >60)
Glucose, Bld: 115 mg/dL — ABNORMAL HIGH (ref 70–99)
Potassium: 4.2 mmol/L (ref 3.5–5.1)
Sodium: 139 mmol/L (ref 135–145)

## 2018-12-11 LAB — GLUCOSE, CAPILLARY
Glucose-Capillary: 108 mg/dL — ABNORMAL HIGH (ref 70–99)
Glucose-Capillary: 108 mg/dL — ABNORMAL HIGH (ref 70–99)
Glucose-Capillary: 111 mg/dL — ABNORMAL HIGH (ref 70–99)
Glucose-Capillary: 117 mg/dL — ABNORMAL HIGH (ref 70–99)

## 2018-12-11 LAB — CBC
HCT: 36.9 % (ref 36.0–46.0)
Hemoglobin: 11.6 g/dL — ABNORMAL LOW (ref 12.0–15.0)
MCH: 30.4 pg (ref 26.0–34.0)
MCHC: 31.4 g/dL (ref 30.0–36.0)
MCV: 96.9 fL (ref 80.0–100.0)
Platelets: 214 10*3/uL (ref 150–400)
RBC: 3.81 MIL/uL — ABNORMAL LOW (ref 3.87–5.11)
RDW: 13.5 % (ref 11.5–15.5)
WBC: 9 10*3/uL (ref 4.0–10.5)
nRBC: 0 % (ref 0.0–0.2)

## 2018-12-11 NOTE — Consult Note (Signed)
[place] Nurse ostomy follow up Patient receiving care in [place]. At the current time, the FYIS box contains the following statement:  "POSSIBLE NOVEL CORONAVIRUS (2019-NCOV)". Stoma type/location: RMQ loop ileostomy Stomal assessment/size: 1 1/4 inch, round, moist, sutures intact, dark pink, producing serosanginous drainage.  Red robin support rod in place. Peristomal assessment: intact Treatment options for stomal/peristomal skin: none needed today Output: serosanginous  Ostomy pouching: 1pc. Cut to fit, flat.  Education provided: Removal of existing pouch, measuring stoma, cutting new barrier, placement of pouch, opening/closing demonstrated today.  PATIENT CANNOT PERFORM ANY OF THE NECESSARY SELF CARE FUNCTIONS FOR THE OSTOMY.  She cannot perform grasping or movement of the left hand and fingers due to effects of a former CVA.  She WILL need support post-discharge.  She states she lives with a man and woman, they won't be able to help her.  But a friend's mother is a Marine scientist and has offered to help, but this person has no transportation to come to the hospital for teaching.  The patient has a phone, but it does not video recording capability to record the teaching session for later viewing.   Enrolled patient in [place] Start Discharge program: Yes  Val Riles, RN, MSN, [facility], CNS-BC, pager [phone]

## 2018-12-11 NOTE — Progress Notes (Signed)
1 Day Post-Op   Subjective/Chief Complaint: Had some pain last night No n/v Working on IS but only got up to 500 yesterday   Objective: Vital signs in last 24 hours: Temp:  [96.7 F (35.9 C)-98 F (36.7 C)] 97.9 F (36.6 C) (07/03 0447) Pulse Rate:  [57-90] 57 (07/03 0447) Resp:  [11-18] 14 (07/03 0447) BP: (100-166)/(66-106) 105/66 (07/03 0447) SpO2:  [98 %-100 %] 100 % (07/03 0447) Weight:  [54.1 kg-55.7 kg] 54.1 kg (07/03 0447)    Intake/Output from previous day: 07/02 0701 - 07/03 0700 In: 3122.4 [P.O.:600; I.V.:2522.4] Out: 1595 [Urine:1225; Drains:265; Stool:30; Blood:75] Intake/Output this shift: No intake/output data recorded.  Alert, resting comfortably, nontoxic Cta, not taking deep breath Reg Soft, expected mild TTP; dressing with some shadowing; JP - serosang; ostomy - viable, air in bag, a little bit serosang drainage in bag No edema  Lab Results:  Recent Labs    12/11/18 0331  WBC 9.0  HGB 11.6*  HCT 36.9  PLT 214   BMET Recent Labs    12/11/18 0331  NA 139  K 4.2  CL 105  CO2 26  GLUCOSE 115*  BUN 14  CREATININE 0.56  CALCIUM 8.8*   PT/INR No results for input(s): LABPROT, INR in the last 72 hours. ABG No results for input(s): PHART, HCO3 in the last 72 hours.  Invalid input(s): PCO2, PO2  Studies/Results: No results found.  Anti-infectives: Anti-infectives (From admission, onward)   Start     Dose/Rate Route Frequency Ordered Stop   12/10/18 2000  cefoTEtan (CEFOTAN) 2 g in sodium chloride 0.9 % 100 mL IVPB     2 g 200 mL/hr over 30 Minutes Intravenous Every 12 hours 12/10/18 1250 12/10/18 2130   12/10/18 0645  cefoTEtan (CEFOTAN) 2 g in sodium chloride 0.9 % 100 mL IVPB     2 g 200 mL/hr over 30 Minutes Intravenous On call to O.R. 12/10/18 8338 12/10/18 0753      Assessment/Plan: s/p Procedure(s): XI ROBOTIC ASSISTED LOWER ANTERIOR RESECTION WITH LOOP ILEOSTOMY (N/A) POD 1 Dr Marcello Moores  Pulmonary Toilet!, IS, Ambulate,  OOB Start chemical VTE prophylaxis Cont foley, dc foley on POD 2 AM Cont Full liquids today Hold her ASA  Leighton Ruff. Redmond Pulling, MD, FACS General, Bariatric, & Minimally Invasive Surgery [facility], [place]   LOS: 1 day    Greer Pickerel 12/11/2018

## 2018-12-11 NOTE — Progress Notes (Signed)
PT demonstrated hands on understanding of Flutter device- npc at this time.
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
## 2018-12-12 LAB — BASIC METABOLIC PANEL
Anion gap: 11 (ref 5–15)
BUN: 12 mg/dL (ref 6–20)
CO2: 24 mmol/L (ref 22–32)
Calcium: 8.7 mg/dL — ABNORMAL LOW (ref 8.9–10.3)
Chloride: 102 mmol/L (ref 98–111)
Creatinine, Ser: 0.66 mg/dL (ref 0.44–1.00)
GFR calc Af Amer: >60 mL/min (ref >60)
GFR calc non Af Amer: >60 mL/min (ref >60)
Glucose, Bld: 142 mg/dL — ABNORMAL HIGH (ref 70–99)
Potassium: 4.4 mmol/L (ref 3.5–5.1)
Sodium: 137 mmol/L (ref 135–145)

## 2018-12-12 LAB — CBC
HCT: 38.9 % (ref 36.0–46.0)
Hemoglobin: 12.2 g/dL (ref 12.0–15.0)
MCH: 31.4 pg (ref 26.0–34.0)
MCHC: 31.4 g/dL (ref 30.0–36.0)
MCV: 100 fL (ref 80.0–100.0)
Platelets: 232 10*3/uL (ref 150–400)
RBC: 3.89 MIL/uL (ref 3.87–5.11)
RDW: 14 % (ref 11.5–15.5)
WBC: 9.1 10*3/uL (ref 4.0–10.5)
nRBC: 0 % (ref 0.0–0.2)

## 2018-12-12 LAB — GLUCOSE, CAPILLARY
Glucose-Capillary: 108 mg/dL — ABNORMAL HIGH (ref 70–99)
Glucose-Capillary: 110 mg/dL — ABNORMAL HIGH (ref 70–99)
Glucose-Capillary: 106 mg/dL — ABNORMAL HIGH (ref 70–99)
Glucose-Capillary: 97 mg/dL (ref 70–99)

## 2018-12-12 MED ORDER — ASPIRIN EC 81 MG PO TBEC
81.0000 mg | DELAYED_RELEASE_TABLET | Freq: Every day | ORAL | Status: DC
  Administered 2018-12-12 – 2018-12-15 (×4): 81 mg via ORAL
  Filled 2018-12-12 (×4): qty 1

## 2018-12-12 NOTE — Progress Notes (Signed)
OT Cancellation Note  Patient Details Name: Jill Williams MRN: [id number] DOB: 09/29/1970   Cancelled Treatment:    Reason Eval/Treat Not Completed: Other (comment).  Pt does not feel she needs OT. She lives alone and has arranged friends to assist.  She doesn't feel she will need any AE--if she changes her mind, please call the dept number and we will stop by.  Sharae Zappulla 12/12/2018, 3:42 PM  Lesle Chris, OTR/L Acute Rehabilitation Services [phone] [facility] pager [phone] office 12/12/2018

## 2018-12-12 NOTE — Evaluation (Signed)
Physical Therapy Evaluation Patient Details Name: Jill Williams MRN: [id number] DOB: Jun 14, 1970 Today's Date: 12/12/2018   History of Present Illness  48 yo female S/P LAR with ileostomy 7/2. Hx of CVA with L hemiparesis, colorectal cancer.  Clinical Impression  On eval, pt was Min guard assist for mobility. She walked ~250 feet around the unit. Pt c/o 8/10 pain R side of abdomen. Will continue to follow. Encouraged pt to continue walking the staff as well.     Follow Up Recommendations Supervision for mobility/OOB    Equipment Recommendations  None recommended by PT    Recommendations for Other Services       Precautions / Restrictions Restrictions Weight Bearing Restrictions: No      Mobility  Bed Mobility Overal bed mobility: Needs Assistance Bed Mobility: Supine to Sit;Sit to Supine     Supine to sit: Supervision;HOB elevated Sit to supine: Supervision;HOB elevated      Transfers Overall transfer level: Needs assistance Equipment used: None Transfers: Sit to/from Stand Sit to Stand: Min guard         General transfer comment: for safety.  Ambulation/Gait   Gait Distance (Feet): 250 Feet Assistive device: None;IV Pole Gait Pattern/deviations: Step-through pattern     General Gait Details: L LE circumduction, increased sway. Unsteady but no LOB. Distance limited by R side abdomen pain/discomfort.  Stairs            Wheelchair Mobility    Modified Rankin (Stroke Patients Only) Modified Rankin (Stroke Patients Only) Modified Rankin: Moderate disability     Balance Overall balance assessment: Mild deficits observed, not formally tested                                           Pertinent Vitals/Pain Pain Assessment: 0-10 Pain Score: 8  Pain Location: R side of abdomen Pain Descriptors / Indicators: Grimacing;Discomfort;Sore Pain Intervention(s): Monitored during session;Patient requesting pain meds-RN notified    Home  Living Family/patient expects to be discharged to:: Private residence Living Arrangements: Non-relatives/Friends Available Help at Discharge: Friend(s);Available 24 hours/day Type of Home: House Home Access: Stairs to enter Entrance Stairs-Rails: Can reach both;Right;Left Entrance Stairs-Number of Steps: 2 Home Layout: One level Home Equipment: Walker - standard Additional Comments: has a RW but reports not using in several years    Prior Function Level of Independence: Independent               Hand Dominance        Extremity/Trunk Assessment   Upper Extremity Assessment Upper Extremity Assessment: Defer to OT evaluation    Lower Extremity Assessment Lower Extremity Assessment: LLE deficits/detail LLE Deficits / Details: L hemiparesis 2* CVA       Communication   Communication: No difficulties  Cognition Arousal/Alertness: Awake/alert Behavior During Therapy: WFL for tasks assessed/performed Overall Cognitive Status: Within Functional Limits for tasks assessed                                        General Comments      Exercises     Assessment/Plan    PT Assessment Patient needs continued PT services  PT Problem List Decreased mobility;Decreased balance;Pain;Impaired tone;Decreased strength       PT Treatment Interventions DME instruction;Gait training;Therapeutic activities;Patient/family education;Therapeutic exercise;Balance training;Functional mobility training  PT Goals (Current goals can be found in the Care Plan section)  Acute Rehab PT Goals Patient Stated Goal: home soon PT Goal Formulation: With patient Time For Goal Achievement: 12/26/18 Potential to Achieve Goals: Good    Frequency Min 3X/week   Barriers to discharge        Co-evaluation               AM-PAC PT "6 Clicks" Mobility  Outcome Measure Help needed turning from your back to your side while in a flat bed without using bedrails?: A Little Help  needed moving from lying on your back to sitting on the side of a flat bed without using bedrails?: A Little Help needed moving to and from a bed to a chair (including a wheelchair)?: A Little Help needed standing up from a chair using your arms (e.g., wheelchair or bedside chair)?: A Little Help needed to walk in hospital room?: A Little Help needed climbing 3-5 steps with a railing? : A Little 6 Click Score: 18    End of Session   Activity Tolerance: Patient limited by pain Patient left: in bed;with call bell/phone within reach   PT Visit Diagnosis: Unsteadiness on feet (R26.81);Hemiplegia and hemiparesis Hemiplegia - Right/Left: Left Hemiplegia - caused by: Cerebral infarction    Time: 6578-4696 PT Time Calculation (min) (ACUTE ONLY): 9 min   Charges:   PT Evaluation $PT Eval Moderate Complexity: 1 Mod            Weston Anna, PT Acute Rehabilitation Services Pager: [phone] Office: [phone]

## 2018-12-12 NOTE — Progress Notes (Signed)
2 Days Post-Op   Subjective/Chief Complaint: Reports had episode of n/v with grits yesterday. "ate too soon" Not a huge appetite. Drinking protein shakes Didn't get out of bed yesterday - "kept calling the nurses to help me get up but they never came, would do it later, finally stop trying at 9pm" Pain ok   Objective: Vital signs in last 24 hours: Temp:  [97.8 F (36.6 C)-98.2 F (36.8 C)] 98.2 F (36.8 C) (07/04 0409) Pulse Rate:  [55-74] 68 (07/04 0409) Resp:  [15-18] 15 (07/04 0409) BP: (93-121)/(55-68) 94/56 (07/04 0409) SpO2:  [94 %-99 %] 99 % (07/04 0409) Weight:  [53.1 kg] 53.1 kg (07/04 0409)    Intake/Output from previous day: 07/03 0701 - 07/04 0700 In: 2034.4 [P.O.:840; I.V.:1194.4] Out: 2955 [Urine:1900; Drains:455; Stool:600] Intake/Output this shift: No intake/output data recorded.  Alert, resting comfortably, nontoxic Cta, not taking deep breath Reg Soft, expected mild TTP; dressing with some shadowing; JP - serosang; ostomy - viable, air in bag, bilious contents in bag No edema  Lab Results:  Recent Labs    12/11/18 0331 12/12/18 0303  WBC 9.0 9.1  HGB 11.6* 12.2  HCT 36.9 38.9  PLT 214 232   BMET Recent Labs    12/11/18 0331 12/12/18 0303  NA 139 137  K 4.2 4.4  CL 105 102  CO2 26 24  GLUCOSE 115* 142*  BUN 14 12  CREATININE 0.56 0.66  CALCIUM 8.8* 8.7*   PT/INR No results for input(s): LABPROT, INR in the last 72 hours. ABG No results for input(s): PHART, HCO3 in the last 72 hours.  Invalid input(s): PCO2, PO2  Studies/Results: No results found.  Anti-infectives: Anti-infectives (From admission, onward)   Start     Dose/Rate Route Frequency Ordered Stop   12/10/18 2000  cefoTEtan (CEFOTAN) 2 g in sodium chloride 0.9 % 100 mL IVPB     2 g 200 mL/hr over 30 Minutes Intravenous Every 12 hours 12/10/18 1250 12/10/18 2130   12/10/18 0645  cefoTEtan (CEFOTAN) 2 g in sodium chloride 0.9 % 100 mL IVPB     2 g 200 mL/hr over 30  Minutes Intravenous On call to O.R. 12/10/18 3016 12/10/18 0753      Assessment/Plan: s/p Procedure(s): XI ROBOTIC ASSISTED LOWER ANTERIOR RESECTION WITH LOOP ILEOSTOMY (N/A) POD 2 Dr Marcello Moores  Pulmonary Toilet, IS, Ambulate, OOB chemical VTE prophylaxis Foley out this am already Cont Full liquids today restart her ASA  Will d/w nursing staff about getting pt out of bed Keep on fulls today Pt states her friend who is a nurse will assist her with her ostomy (pt has persistent Left sided weakness after cva)- "she cared for her husband who had a ostomy for 21yrs"   Frona Yost M. Redmond Pulling, MD, FACS General, Bariatric, & Minimally Invasive Surgery [facility], [place]   LOS: 2 days    Greer Pickerel 12/12/2018

## 2018-12-13 LAB — CBC
HCT: 41.8 % (ref 36.0–46.0)
Hemoglobin: 13.6 g/dL (ref 12.0–15.0)
MCH: 31.3 pg (ref 26.0–34.0)
MCHC: 32.5 g/dL (ref 30.0–36.0)
MCV: 96.3 fL (ref 80.0–100.0)
Platelets: 225 10*3/uL (ref 150–400)
RBC: 4.34 MIL/uL (ref 3.87–5.11)
RDW: 13.4 % (ref 11.5–15.5)
WBC: 5.9 10*3/uL (ref 4.0–10.5)
nRBC: 0 % (ref 0.0–0.2)

## 2018-12-13 LAB — GLUCOSE, CAPILLARY
Glucose-Capillary: 110 mg/dL — ABNORMAL HIGH (ref 70–99)
Glucose-Capillary: 102 mg/dL — ABNORMAL HIGH (ref 70–99)
Glucose-Capillary: 103 mg/dL — ABNORMAL HIGH (ref 70–99)
Glucose-Capillary: 108 mg/dL — ABNORMAL HIGH (ref 70–99)

## 2018-12-13 LAB — BASIC METABOLIC PANEL
Anion gap: 9 (ref 5–15)
BUN: 12 mg/dL (ref 6–20)
CO2: 24 mmol/L (ref 22–32)
Calcium: 9 mg/dL (ref 8.9–10.3)
Chloride: 104 mmol/L (ref 98–111)
Creatinine, Ser: 0.43 mg/dL — ABNORMAL LOW (ref 0.44–1.00)
GFR calc Af Amer: >60 mL/min (ref >60)
GFR calc non Af Amer: >60 mL/min (ref >60)
Glucose, Bld: 98 mg/dL (ref 70–99)
Potassium: 4 mmol/L (ref 3.5–5.1)
Sodium: 137 mmol/L (ref 135–145)

## 2018-12-13 NOTE — Progress Notes (Signed)
3 Days Post-Op   Subjective/Chief Complaint: Walked yesterday Denies n/v Not too chatty today Recorded 1900 out of ostomy   Objective: Vital signs in last 24 hours: Temp:  [97.8 F (36.6 C)-98.4 F (36.9 C)] 98.4 F (36.9 C) (07/05 0626) Pulse Rate:  [59-72] 72 (07/05 0626) Resp:  [16-18] 18 (07/05 0626) BP: (85-126)/(42-90) 124/90 (07/05 0626) SpO2:  [96 %-99 %] 99 % (07/05 0626) Last BM Date: 12/12/18  Intake/Output from previous day: 07/04 0701 - 07/05 0700 In: 1883.9 [P.O.:720; I.V.:1163.9] Out: 5038 [Urine:1225; Drains:535; UEKCM:0349] Intake/Output this shift: No intake/output data recorded.  Alert, resting comfortably, nontoxic Cta, not taking deep breath Reg Soft, expected mild TTP; dressing with some shadowing; JP - serosang; ostomy - viable, air in bag, bilious contents in bag No edema  Lab Results:  Recent Labs    12/12/18 0303 12/13/18 0345  WBC 9.1 5.9  HGB 12.2 13.6  HCT 38.9 41.8  PLT 232 225   BMET Recent Labs    12/12/18 0303 12/13/18 0345  NA 137 137  K 4.4 4.0  CL 102 104  CO2 24 24  GLUCOSE 142* 98  BUN 12 12  CREATININE 0.66 0.43*  CALCIUM 8.7* 9.0   PT/INR No results for input(s): LABPROT, INR in the last 72 hours. ABG No results for input(s): PHART, HCO3 in the last 72 hours.  Invalid input(s): PCO2, PO2  Studies/Results: No results found.  Anti-infectives: Anti-infectives (From admission, onward)   Start     Dose/Rate Route Frequency Ordered Stop   12/10/18 2000  cefoTEtan (CEFOTAN) 2 g in sodium chloride 0.9 % 100 mL IVPB     2 g 200 mL/hr over 30 Minutes Intravenous Every 12 hours 12/10/18 1250 12/10/18 2130   12/10/18 0645  cefoTEtan (CEFOTAN) 2 g in sodium chloride 0.9 % 100 mL IVPB     2 g 200 mL/hr over 30 Minutes Intravenous On call to O.R. 12/10/18 1791 12/10/18 0753      Assessment/Plan: s/p Procedure(s): XI ROBOTIC ASSISTED LOWER ANTERIOR RESECTION WITH LOOP ILEOSTOMY (N/A) POD 3 Dr  Marcello Moores  Pulmonary Toilet, IS, Ambulate, OOB chemical VTE prophylaxis Adv to regular diet  Adv diet today Probable home Monday but will need to confirm friend is available  Pt states her friend who is a nurse will assist her with her ostomy (pt has persistent Left sided weakness after cva)- "she cared for her husband who had a ostomy for 45yrs"   Tuesday Terlecki M. Redmond Pulling, MD, FACS General, Bariatric, & Minimally Invasive Surgery [facility], [place]   LOS: 3 days    Greer Pickerel 12/13/2018

## 2018-12-13 NOTE — Plan of Care (Signed)
Patient lying in bed this morning; pain and nausea controlled. Up in room to bathroom. Needs to ambulate in hall. Will continue to monitor.

## 2018-12-14 LAB — GLUCOSE, CAPILLARY
Glucose-Capillary: 120 mg/dL — ABNORMAL HIGH (ref 70–99)
Glucose-Capillary: 124 mg/dL — ABNORMAL HIGH (ref 70–99)
Glucose-Capillary: 124 mg/dL — ABNORMAL HIGH (ref 70–99)
Glucose-Capillary: 140 mg/dL — ABNORMAL HIGH (ref 70–99)

## 2018-12-14 MED ORDER — LOPERAMIDE HCL 2 MG PO CAPS
4.0000 mg | ORAL_CAPSULE | Freq: Three times a day (TID) | ORAL | Status: DC
  Administered 2018-12-14 – 2018-12-15 (×5): 4 mg via ORAL
  Filled 2018-12-14 (×5): qty 2

## 2018-12-14 NOTE — Progress Notes (Signed)
Physical Therapy Treatment Patient Details Name: Jill Williams MRN: [id number] DOB: [birth year] Today's Date: 12/14/2018    History of Present Illness 48 yo female S/P LAR with ileostomy 7/2. Hx of CVA with L hemiparesis, colorectal cancer.    PT Comments    Pt continues to participate well. She reports ambulating with nursing. She c/o some lightheadedness on today. She continues to report she will have assistance as needed at home. Per pt, she hopes to d/c home soon.    Follow Up Recommendations  Supervision for mobility/OOB     Equipment Recommendations  None recommended by PT    Recommendations for Other Services       Precautions / Restrictions Precautions Precaution Comments: ostomy Restrictions Weight Bearing Restrictions: No    Mobility  Bed Mobility Overal bed mobility: Needs Assistance Bed Mobility: Supine to Sit;Sit to Supine     Supine to sit: Supervision;HOB elevated Sit to supine: Supervision;HOB elevated   General bed mobility comments: for lines, safety  Transfers Overall transfer level: Needs assistance   Transfers: Sit to/from Stand Sit to Stand: Supervision         General transfer comment: for safety  Ambulation/Gait Ambulation/Gait assistance: Min guard Gait Distance (Feet): 250 Feet Assistive device: IV Pole Gait Pattern/deviations: Step-through pattern     General Gait Details: L LE circumduction, increased sway. Unsteady but no LOB. Pt c/o some lightheadedness   Stairs             Wheelchair Mobility    Modified Rankin (Stroke Patients Only)       Balance Overall balance assessment: Needs assistance           Standing balance-Leahy Scale: Fair                              Cognition Arousal/Alertness: Awake/alert Behavior During Therapy: WFL for tasks assessed/performed Overall Cognitive Status: Within Functional Limits for tasks assessed                                         Exercises      General Comments        Pertinent Vitals/Pain Pain Assessment: No/denies pain    Home Living                      Prior Function            PT Goals (current goals can now be found in the care plan section) Progress towards PT goals: Progressing toward goals    Frequency    Min 3X/week      PT Plan Current plan remains appropriate    Co-evaluation              AM-PAC PT "6 Clicks" Mobility   Outcome Measure  Help needed turning from your back to your side while in a flat bed without using bedrails?: None Help needed moving from lying on your back to sitting on the side of a flat bed without using bedrails?: None Help needed moving to and from a bed to a chair (including a wheelchair)?: A Little Help needed standing up from a chair using your arms (e.g., wheelchair or bedside chair)?: A Little Help needed to walk in hospital room?: A Little Help needed climbing 3-5 steps with a railing? : A Little 6 Click Score:  20    End of Session   Activity Tolerance: Patient tolerated treatment well Patient left: in bed;with call bell/phone within reach   PT Visit Diagnosis: Unsteadiness on feet (R26.81);Hemiplegia and hemiparesis Hemiplegia - Right/Left: Left Hemiplegia - caused by: Cerebral infarction     Time: 1056-1105 PT Time Calculation (min) (ACUTE ONLY): 9 min  Charges:  $Gait Training: 8-22 mins                       Weston Anna, PT Acute Rehabilitation Services Pager: [phone] Office: [phone]

## 2018-12-14 NOTE — Consult Note (Addendum)
[place] Nurse ostomy follow up Stoma type/location: RLQ ileostomy with red robinson retention rod in place.  Explained rationale for rod and that this would be removed.  Patient has been weak and not able to participate in care.  I empty the old pouch first and allow her to participate and ask questions.  She cannot use left hand at all.  She is able to roll closed but I do not think she can clean the spout opening or care for this currently.  She lives with two other adults and has indicated one female will help her day to day and his mother who is a nurse will assist with pouch changes.  Encouraged her to think about a scenario where her pouch leaks at 3 AM and how she would care for herself.  Encouraged her to have someone in the home that can perform pouch change.  She agrees to think about this.  She would benefit from ongoing teaching through [facility].  She becomes emotional when pouch is removed.  Emotional support is provided.  Stomal assessment/size: 1 1/4" pink stoma with retention rod in place Peristomal assessment: intact Treatment options for stomal/peristomal skin: Adding barrier ring and 1 piece flat pouch Output liquid green stool Ostomy pouching: 1pc.flat wiith barrier ring  Education provided: Discussed showering, rationale for barrier ring and application process.  Enrolled patient in [place] Start Discharge program: Yes- I have placed 5 pouch sets in the room with barrier rings.  Patient understands they are for discharge home. She anticipates discharge tomorrow.  [place] team will follow. Domenic Moras MSN, RN, FNP-BC CWON Wound, Ostomy, Continence Nurse Pager [phone]

## 2018-12-14 NOTE — Progress Notes (Signed)
Pharmacy Brief Note - Alvimopan (Entereg)  The standing order set for alvimopan (Entereg) now includes an automatic order to discontinue the drug after the patient has had a bowel movement. The change was approved by the [place] and the Medical Executive Committee.  This patient has had a bowel movement documented by nursing. Therefore, alvimopan has been discontinued. If there are questions, please contact the pharmacy at [phone].  Thank you   Reuel Boom, PharmD, BCPS [phone] 12/14/2018, 4:07 PM

## 2018-12-14 NOTE — Progress Notes (Signed)
4 Days Post-Op   Subjective/Chief Complaint: Walked yesterday Denies n/v Tolerating solids Recorded 1900 out of ostomy   Objective: Vital signs in last 24 hours: Temp:  [97.4 F (36.3 C)-98.5 F (36.9 C)] 97.4 F (36.3 C) (07/06 0536) Pulse Rate:  [64-77] 69 (07/06 0536) Resp:  [17-18] 17 (07/06 0536) BP: (120-144)/(76-91) 120/76 (07/06 0536) SpO2:  [99 %-100 %] 99 % (07/06 0536) Weight:  [49.1 kg] 49.1 kg (07/06 0536) Last BM Date: 12/13/18  Intake/Output from previous day: 07/05 0701 - 07/06 0700 In: 2189.3 [P.O.:1070; I.V.:1119.3] Out: 3220 [Urine:200; Drains:265; URKYH:0623] Intake/Output this shift: No intake/output data recorded.  Alert, resting comfortably, nontoxic Cta, not taking deep breath Reg Soft, expected mild TTP; clean wound; JP - serosang; ostomy - viable, air in bag, bilious contents in bag No edema  Lab Results:  Recent Labs    12/12/18 0303 12/13/18 0345  WBC 9.1 5.9  HGB 12.2 13.6  HCT 38.9 41.8  PLT 232 225   BMET Recent Labs    12/12/18 0303 12/13/18 0345  NA 137 137  K 4.4 4.0  CL 102 104  CO2 24 24  GLUCOSE 142* 98  BUN 12 12  CREATININE 0.66 0.43*  CALCIUM 8.7* 9.0   PT/INR No results for input(s): LABPROT, INR in the last 72 hours. ABG No results for input(s): PHART, HCO3 in the last 72 hours.  Invalid input(s): PCO2, PO2  Studies/Results: No results found.  Anti-infectives: Anti-infectives (From admission, onward)   Start     Dose/Rate Route Frequency Ordered Stop   12/10/18 2000  cefoTEtan (CEFOTAN) 2 g in sodium chloride 0.9 % 100 mL IVPB     2 g 200 mL/hr over 30 Minutes Intravenous Every 12 hours 12/10/18 1250 12/10/18 2130   12/10/18 0645  cefoTEtan (CEFOTAN) 2 g in sodium chloride 0.9 % 100 mL IVPB     2 g 200 mL/hr over 30 Minutes Intravenous On call to O.R. 12/10/18 7628 12/10/18 0753      Assessment/Plan: s/p Procedure(s): XI ROBOTIC ASSISTED LOWER ANTERIOR RESECTION WITH LOOP ILEOSTOMY (N/A) POD  4  Pulmonary Toilet, IS, Ambulate, OOB chemical VTE prophylaxis Adv to regular diet  Cont diet today Add imodium for high ostomy output Increase IVF's for low UOP D/c JP Pt states her friend who is a nurse will assist her with her ostomy   Rosario Adie, MD  Colorectal and [facility]   LOS: 4 days    Rosario Adie 08/09/5174

## 2018-12-15 LAB — GLUCOSE, CAPILLARY
Glucose-Capillary: 119 mg/dL — ABNORMAL HIGH (ref 70–99)
Glucose-Capillary: 116 mg/dL — ABNORMAL HIGH (ref 70–99)

## 2018-12-15 MED ORDER — TRAMADOL HCL 50 MG PO TABS: 50.0000 mg | ORAL_TABLET | Freq: Four times a day (QID) | ORAL | 0 refills | Status: DC | PRN

## 2018-12-15 MED ORDER — LOPERAMIDE HCL 2 MG PO CAPS: 4.0000 mg | ORAL_CAPSULE | Freq: Three times a day (TID) | ORAL | 0 refills | Status: DC

## 2018-12-15 NOTE — TOC Progression Note (Signed)
Transition of Care [facility]) - Progression Note    Patient Details  Name: Jill Williams MRN: [id number] Date of Birth: 11-10-1970  [facility] CM/SW Contact  Joaquin Courts, RN Phone Number: 12/15/2018, 12:16 PM  Clinical Narrative:    CM received call from [place] stating that [facility] provide [facility] services starting Friday. CM notified patient who stated she did not need a HHRN. Declines HH services.     Expected Discharge Plan: Home/Self Care Barriers to Discharge: No Barriers Identified  Expected Discharge Plan and Services Expected Discharge Plan: Home/Self Care   Discharge Planning Services: CM Consult   Living arrangements for the past 2 months: Single Family Home Expected Discharge Date: 12/15/18               DME Arranged: N/A DME Agency: NA       HH Arranged: NA HH Agency: NA         Social Determinants of Health (SDOH) Interventions    Readmission Risk Interventions No flowsheet data found.

## 2018-12-15 NOTE — Progress Notes (Signed)
Pt alert and oriented. Not really wanting to eat much. D/C instructions given, emphasizing the need to stay hydrated.  Supplies for ostomy sent with pt. D/C instructions given. All questions answered. Pt will be d/cd home.

## 2018-12-15 NOTE — Discharge Instructions (Signed)

## 2018-12-15 NOTE — TOC Initial Note (Signed)
Transition of Care [facility]) - Initial/Assessment Note    Patient Details  Name: Jill Williams MRN: [id number] Date of Birth: 11-09-1970  Transition of Care (TOC) CM/SW Contact:    Joaquin Courts, RN Phone Number: 12/15/2018, 10:54 AM  Clinical Narrative:      CM spoke with patient at bedside regarding need for [facility]. All HH agencies contacted by CM have declined the patient either due to out of network insurance or staffing difficulties.  Patient states she has a friend, cousin, and her "guy friends" mpther who are all nurses and can assist patient at se needed.  Patient aslo states she is comfortable with managing her ostomy by herself and has had education and practice in the hospital. Additionally, she has extra supplies in her room to take home and expects more supplies to arrive in the mail. CM was informed by bedside RN that patient needed transportation assistance. Patient states she contacted the cancer center in [place] and they will send a ride to pick her up as soon as she is able to give them a d/c time.               Expected Discharge Plan: Home/Self Care Barriers to Discharge: No Barriers Identified   Patient Goals and CMS Choice        Expected Discharge Plan and Services Expected Discharge Plan: Home/Self Care   Discharge Planning Services: CM Consult   Living arrangements for the past 2 months: Single Family Home Expected Discharge Date: 12/15/18               DME Arranged: N/A DME Agency: NA       HH Arranged: NA [place] Agency: NA        Prior Living Arrangements/Services Living arrangements for the past 2 months: Single Family Home Lives with:: Friends Patient language and need for interpreter reviewed:: Yes Do you feel safe going back to the place where you live?: Yes      Need for Family Participation in Patient Care: Yes (Comment) Care giver support system in place?: Yes (comment)   Criminal Activity/Legal Involvement Pertinent to Current  Situation/Hospitalization: No - Comment as needed  Activities of Daily Living Home Assistive Devices/Equipment: Dentures (specify type), Eyeglasses ADL Screening (condition at time of admission) Patient's cognitive ability adequate to safely complete daily activities?: Yes Is the patient deaf or have difficulty hearing?: No Does the patient have difficulty seeing, even when wearing glasses/contacts?: No Does the patient have difficulty concentrating, remembering, or making decisions?: No Patient able to express need for assistance with ADLs?: Yes Does the patient have difficulty dressing or bathing?: No Independently performs ADLs?: Yes (appropriate for developmental age) Does the patient have difficulty walking or climbing stairs?: No Weakness of Legs: None Weakness of Arms/Hands: None  Permission Sought/Granted                  Emotional Assessment Appearance:: Appears stated age Attitude/Demeanor/Rapport: Engaged Affect (typically observed): Accepting Orientation: : Oriented to Self, Oriented to Place, Oriented to  Time, Oriented to Situation   Psych Involvement: No (comment)  Admission diagnosis:  rectal cancer Patient Active Problem List   Diagnosis Date Noted  . Rectal cancer ([place]) 12/10/2018  . Right-sided carotid artery disease ([place]) 02/15/2018  . Hematemesis with nausea 02/04/2018  . Acute encephalopathy 02/04/2018  . History of stroke with residual deficit 02/04/2018  . GI bleed 02/04/2018  . Tobacco abuse counseling   . TIA (transient ischemic attack) 01/19/2018  . Syncope  01/19/2018  . Carotid artery stenosis, asymptomatic, bilateral 01/18/2018  . Stroke (cerebrum) ([place]) 01/18/2018  . Essential hypertension   . HLD (hyperlipidemia)   . Tobacco abuse   . Stroke ([place]) 05/10/2014   PCP:  Maris Berger, MD Pharmacy:   [facility] [address] [place] [address] [place] [address] Phone: [phone] Fax:  [phone]     Social Determinants of Health (SDOH) Interventions    Readmission Risk Interventions No flowsheet data found.

## 2018-12-15 NOTE — Discharge Summary (Signed)
Physician Discharge Summary  Patient ID: Jill Williams MRN: [id number] DOB/AGE: [birth year] 50 y.o.  Admit date: 12/10/2018 Discharge date: 12/15/2018  Admission Diagnoses: rectal cancer  Discharge Diagnoses:  Active Problems:   Rectal cancer [facility]) h/o CVA with residual defect  Discharged Condition: good  Hospital Course: Patient was admitted to the hospital after low anterior resection with coloanal anastomosis and distal loop ileostomy.  Over the next few days her diet was advanced as tolerated.  Her ileostomy began to function but her outputs were too high to keep yourself hydrated and therefore Imodium was added 4 times a day to help decrease her output.  This resulted in stool output of less than a liter over 24 hours.  She was felt to be in stable condition for discharge to home.  She will continue ostomy teaching at home with her home health aide.  Consults: None  Significant Diagnostic Studies: labs: cbc, bmet  Treatments: IV hydration and surgery: see above  Discharge Exam: Blood pressure 110/71, pulse 72, temperature 97.8 F (36.6 C), temperature source Oral, resp. rate 14, height 5\' 5"  (1.651 m), weight 49.1 kg, SpO2 99 %. General appearance: alert and cooperative GI: normal findings: soft, non-tender Incision/Wound: clean, dry  Disposition: Discharge disposition: 01-Home or Self Care           Signed: Rosario Adie 3/0/1601, 9:40 AM

## 2018-12-15 NOTE — Plan of Care (Signed)
All goals met for d/c.

## 2018-12-15 NOTE — Consult Note (Addendum)
[place] Nurse ostomy follow up New orders to remove red rubber catheter retention rod. I completed this and applied new pouch today.  Anticipate discharge today.  Patient has supplies needed for discharge.  HH is not an option at this time and patient verbalized understanding and states she has "a lot of nurses around me to help".  Stoma type/location: RLQ ileostomy Stomal assessment/size: See 12/14/18 note Peristomal assessment: intact Treatment options for stomal/peristomal skin: barrier ring and 1 piece convex pouch Output liquid green stool Ostomy pouching: 1pc.convex Education provided: Explained removal procedure.  Patient verbalizes understanding.  Enrolled patient in [place] Start Discharge program: Yes Will not follow at this time.  Please re-consult if needed.  Domenic Moras MSN, RN, FNP-BC CWON Wound, Ostomy, Continence Nurse Pager [phone]

## 2019-01-01 DIAGNOSIS — Z599 Problem related to housing and economic circumstances, unspecified: Secondary | ICD-10-CM | POA: Insufficient documentation

## 2019-01-01 DIAGNOSIS — Z59819 Housing instability, housed unspecified: Secondary | ICD-10-CM | POA: Insufficient documentation

## 2019-01-01 DIAGNOSIS — E46 Unspecified protein-calorie malnutrition: Secondary | ICD-10-CM | POA: Insufficient documentation

## 2019-01-07 DIAGNOSIS — C20 Malignant neoplasm of rectum: Secondary | ICD-10-CM

## 2019-01-26 DIAGNOSIS — C20 Malignant neoplasm of rectum: Secondary | ICD-10-CM | POA: Diagnosis not present

## 2019-02-20 DIAGNOSIS — N179 Acute kidney failure, unspecified: Secondary | ICD-10-CM

## 2019-02-20 DIAGNOSIS — C20 Malignant neoplasm of rectum: Secondary | ICD-10-CM

## 2019-02-20 DIAGNOSIS — Z933 Colostomy status: Secondary | ICD-10-CM

## 2019-02-20 DIAGNOSIS — Z720 Tobacco use: Secondary | ICD-10-CM

## 2019-02-20 DIAGNOSIS — E875 Hyperkalemia: Secondary | ICD-10-CM

## 2019-02-20 DIAGNOSIS — I10 Essential (primary) hypertension: Secondary | ICD-10-CM

## 2019-02-21 DIAGNOSIS — C20 Malignant neoplasm of rectum: Secondary | ICD-10-CM | POA: Diagnosis not present

## 2019-02-21 DIAGNOSIS — Z933 Colostomy status: Secondary | ICD-10-CM | POA: Diagnosis not present

## 2019-02-21 DIAGNOSIS — N179 Acute kidney failure, unspecified: Secondary | ICD-10-CM | POA: Diagnosis not present

## 2019-02-21 DIAGNOSIS — E875 Hyperkalemia: Secondary | ICD-10-CM | POA: Diagnosis not present

## 2019-02-22 DIAGNOSIS — E875 Hyperkalemia: Secondary | ICD-10-CM | POA: Diagnosis not present

## 2019-02-22 DIAGNOSIS — D61810 Antineoplastic chemotherapy induced pancytopenia: Secondary | ICD-10-CM

## 2019-02-22 DIAGNOSIS — C20 Malignant neoplasm of rectum: Secondary | ICD-10-CM

## 2019-02-22 DIAGNOSIS — E860 Dehydration: Secondary | ICD-10-CM

## 2019-02-22 DIAGNOSIS — Z933 Colostomy status: Secondary | ICD-10-CM | POA: Diagnosis not present

## 2019-02-22 DIAGNOSIS — N179 Acute kidney failure, unspecified: Secondary | ICD-10-CM

## 2019-03-21 IMAGING — MR MR HEAD W/O CM
9 of 10 series · 35 of 48 positions shown · non-contrast
Comparison: CT head without contrast 02/13/2018. MRI brain
01/19/2018

CLINICAL DATA: New onset of left-sided numbness and dizziness
beginning yesterday.

EXAM:
MRI HEAD WITHOUT CONTRAST
TECHNIQUE: Multiplanar, multiecho pulse sequences of the brain and surrounding
structures were obtained without intravenous contrast.

[Series 3: DWI · axial · 3.0mm · 0.94mm/px · z∈[-47,+93]mm · 8 of 98 slices shown (1 of 2)]
[im 1/98]
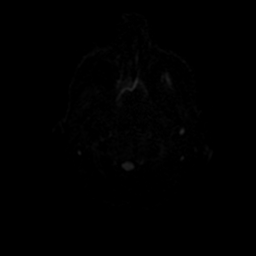
[im 11/98]
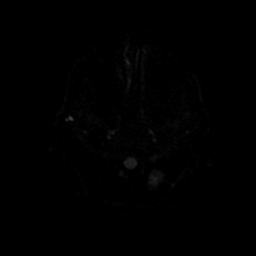
[im 33/98]
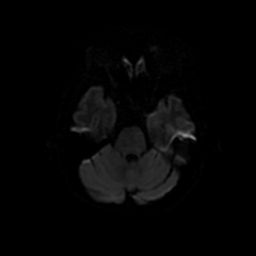
[im 44/98]
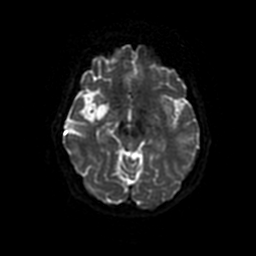
[im 54/98]
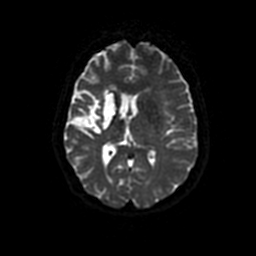
[im 65/98]
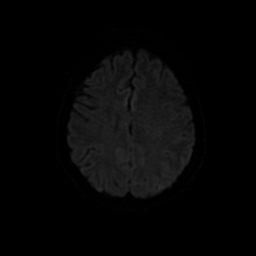
[im 87/98]
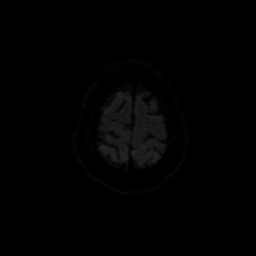
[im 98/98]
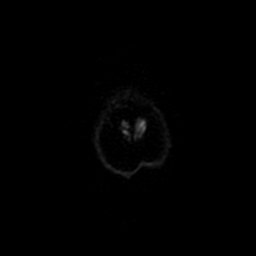

[Series 4: DWI · coronal · 4.0mm · 0.94mm/px · 7 of 72 slices shown (2 of 2)]
[im 1/72]
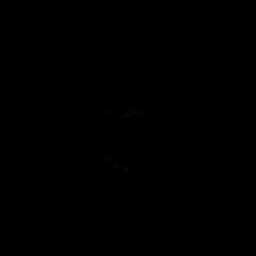
[im 12/72]
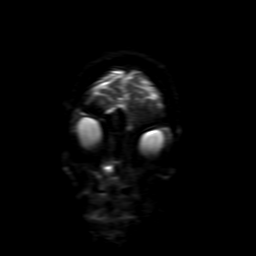
[im 24/72]
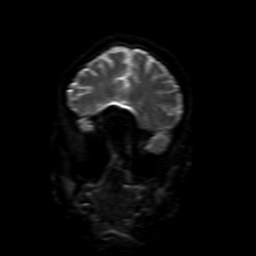
[im 36/72]
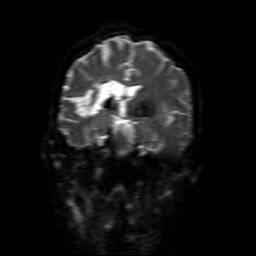
[im 48/72]
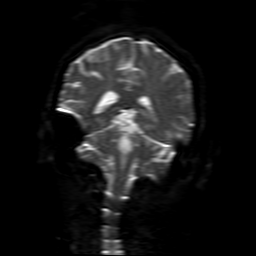
[im 60/72]
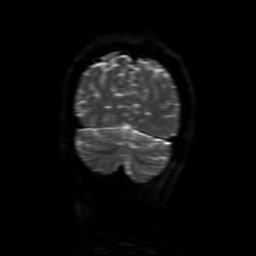
[im 72/72]
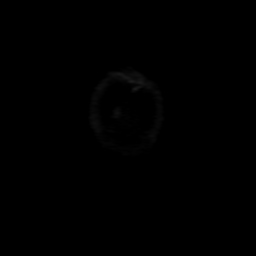

[Series 5: FLAIR · sagittal · 5.0mm · 0.47mm/px · 2 of 23 slices shown (1 of 2)]
[im 1/23]
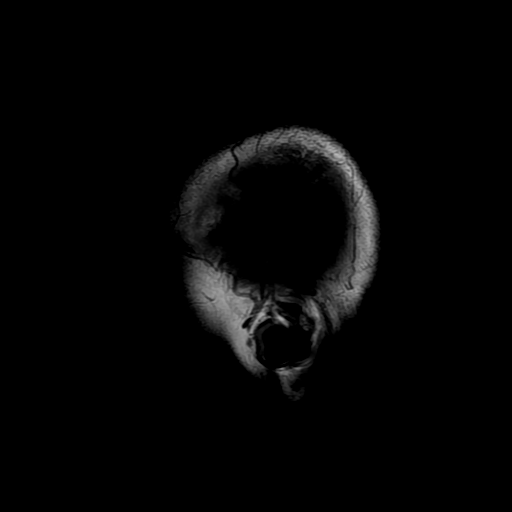
[im 23/23]
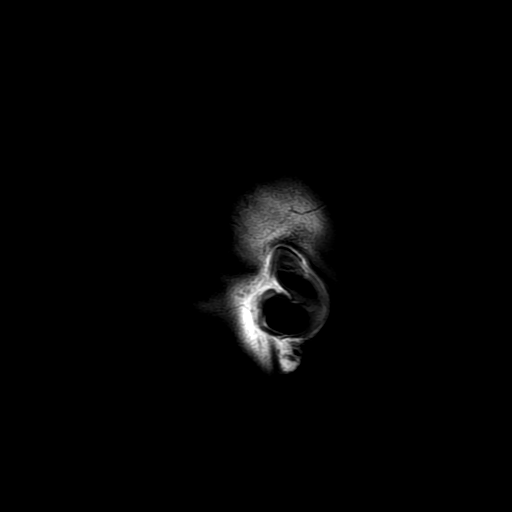

[Series 7: T2 · axial · 5.0mm · 0.47mm/px · z∈[-47,+93]mm · 2 of 25 slices shown]
[im 1/25]
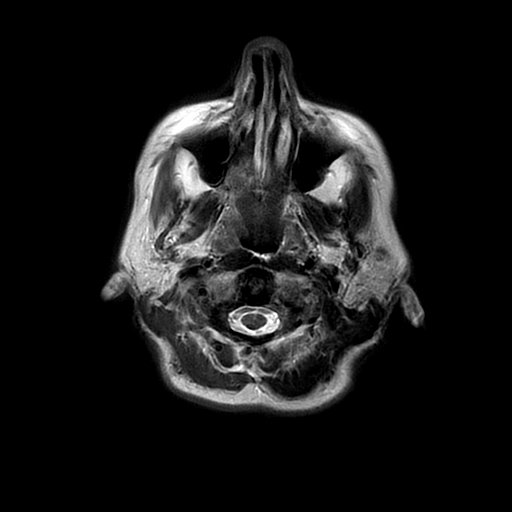
[im 25/25]
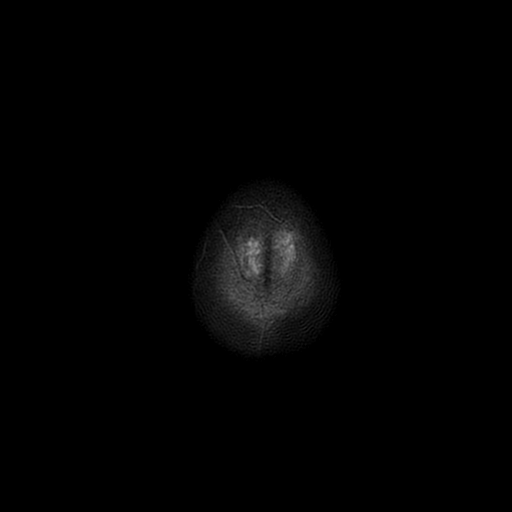

[Series 8: FLAIR · axial · 3.0mm · 0.45mm/px · z∈[-48,+92]mm · 2 of 25 slices shown (2 of 2)]
[im 1/25]
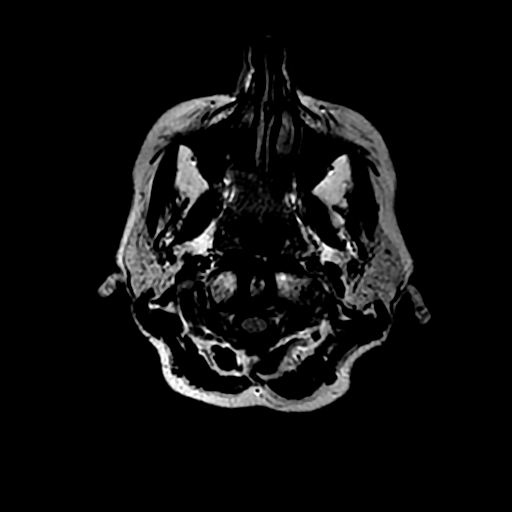
[im 25/25]
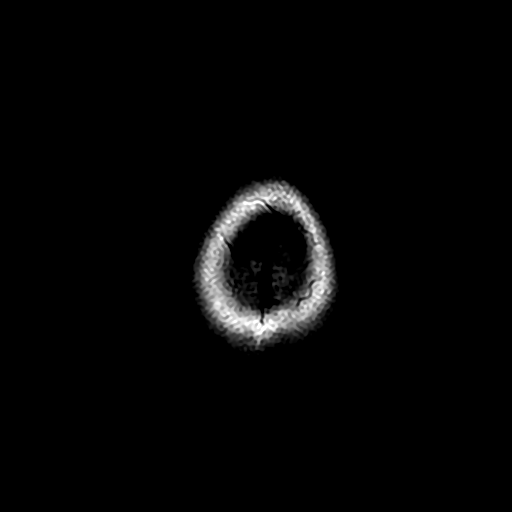

[Series 9: (person_name) · axial · 3.0mm · 0.45mm/px · z∈[-50,-15]mm · 3 of 100 slices shown]
[im 1/100]
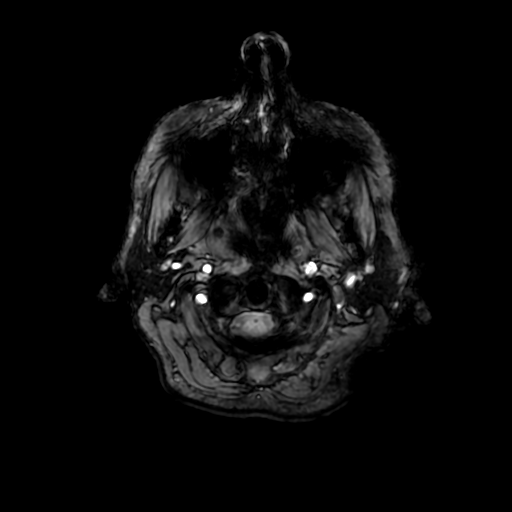
[im 13/100]
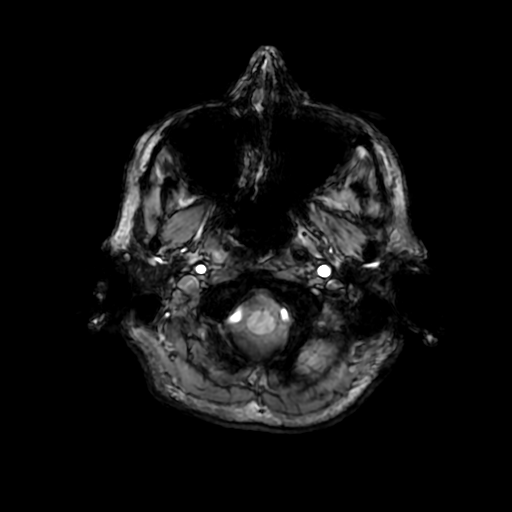
[im 25/100]
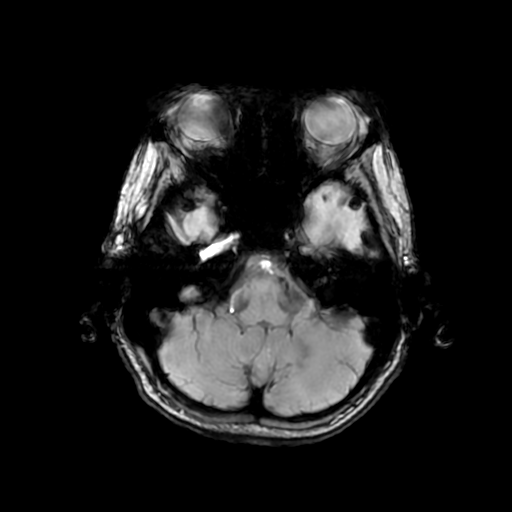

[Series 11: T2 post-contrast · coronal · 5.0mm · 0.39mm/px · 3 of 30 slices shown]
[im 1/30]
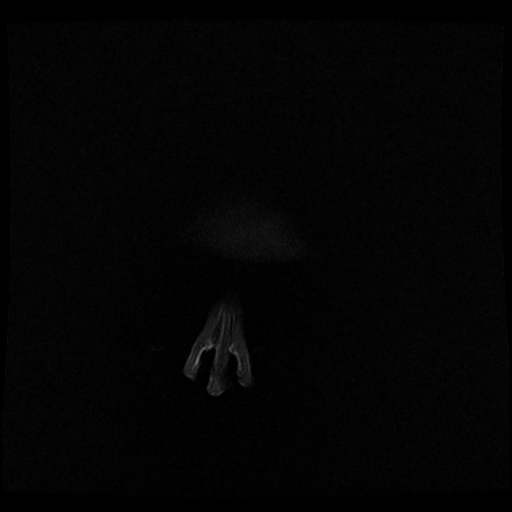
[im 15/30]
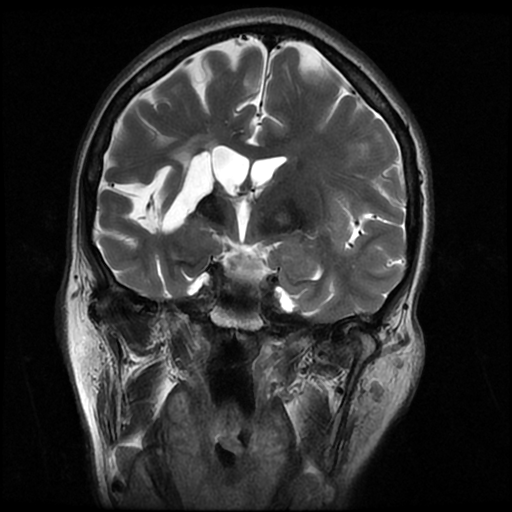
[im 30/30]
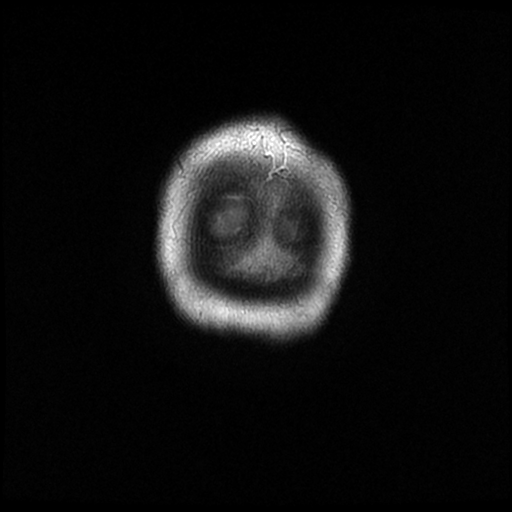

[Series 350: ADC · axial · 3.0mm · 0.94mm/px · z∈[-47,+93]mm · 5 of 49 slices shown (1 of 2)]
[im 1/49]
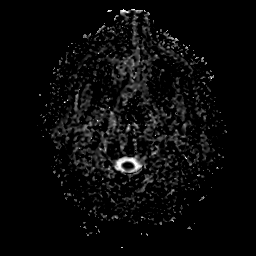
[im 13/49]
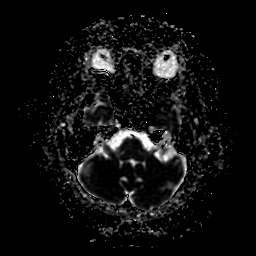
[im 25/49]
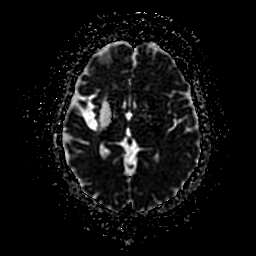
[im 37/49]
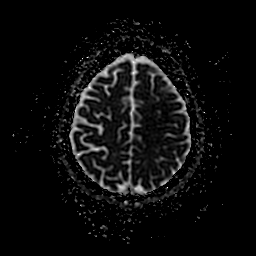
[im 49/49]
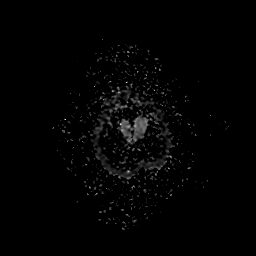

[Series 450: ADC · coronal · 4.0mm · 0.94mm/px · 3 of 36 slices shown (2 of 2)]
[im 1/36]
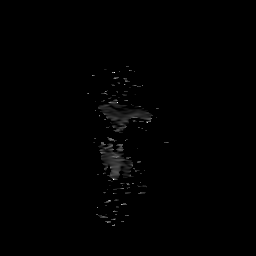
[im 18/36]
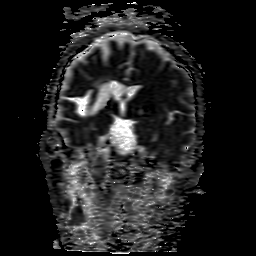
[im 36/36]
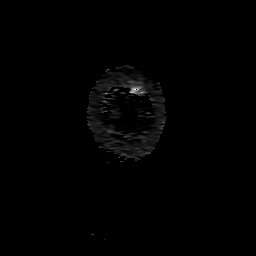

[35 of 48 positions shown; findings below may reference images not displayed]

FINDINGS: Brain: The remote right basal ganglia infarct is stable. No acute
infarct, hemorrhage, or mass lesion is present. Mild white matter
disease otherwise is stable. There is stable ex vacuo dilation of
the right lateral ventricle.

The internal auditory canals are within normal limits bilaterally.
The brainstem and cerebellum are normal. Chronic wallerian
degeneration is evident in the brainstem.

Vascular: Flow is present in the major intracranial arteries.

Skull and upper cervical spine: The skull base is within normal
limits. The craniocervical junction is normal. The upper cervical
spine is unremarkable.

Sinuses/Orbits: The paranasal sinuses and mastoid air cells are
clear. Globes and orbits are within normal limits.
IMPRESSION: 1. Remote right basal ganglia infarct with associated ex vacuo
dilation of the right lateral ventricle is stable.
2. No acute intracranial abnormalities.

## 2019-03-22 DIAGNOSIS — C20 Malignant neoplasm of rectum: Secondary | ICD-10-CM | POA: Diagnosis not present

## 2019-04-16 DIAGNOSIS — E871 Hypo-osmolality and hyponatremia: Secondary | ICD-10-CM

## 2019-04-16 DIAGNOSIS — E860 Dehydration: Secondary | ICD-10-CM

## 2019-04-16 DIAGNOSIS — C20 Malignant neoplasm of rectum: Secondary | ICD-10-CM

## 2019-04-18 DIAGNOSIS — C20 Malignant neoplasm of rectum: Secondary | ICD-10-CM

## 2019-04-19 DIAGNOSIS — C20 Malignant neoplasm of rectum: Secondary | ICD-10-CM

## 2019-04-26 DIAGNOSIS — C20 Malignant neoplasm of rectum: Secondary | ICD-10-CM

## 2019-05-11 ENCOUNTER — Ambulatory Visit: Payer: Self-pay | Admitting: General Surgery

## 2019-05-17 DIAGNOSIS — C20 Malignant neoplasm of rectum: Secondary | ICD-10-CM | POA: Diagnosis not present

## 2019-06-28 ENCOUNTER — Other Ambulatory Visit (HOSPITAL_COMMUNITY): Payer: Medicaid Other

## 2019-06-28 ENCOUNTER — Inpatient Hospital Stay (HOSPITAL_COMMUNITY): Admission: RE | Admit: 2019-06-28 | Payer: Medicaid Other | Source: Ambulatory Visit

## 2019-07-01 ENCOUNTER — Inpatient Hospital Stay: Admit: 2019-07-01 | Payer: Medicaid Other | Admitting: General Surgery

## 2019-07-01 SURGERY — CLOSURE, ILEOSTOMY
Anesthesia: General

## 2019-07-12 ENCOUNTER — Ambulatory Visit: Payer: Self-pay | Admitting: General Surgery

## 2019-07-14 ENCOUNTER — Encounter (HOSPITAL_COMMUNITY): Admission: RE | Admit: 2019-07-14 | Payer: Medicaid Other | Source: Ambulatory Visit

## 2019-07-16 NOTE — Patient Instructions (Addendum)
DUE TO COVID-19 ONLY ONE VISITOR IS ALLOWED TO COME WITH YOU AND STAY IN THE WAITING ROOM ONLY DURING PRE OP AND PROCEDURE DAY OF SURGERY. THE 1 VISITOR MAY VISIT WITH YOU AFTER SURGERY IN YOUR PRIVATE ROOM DURING VISITING HOURS ONLY!  YOU NEED TO HAVE A COVID 19 TEST ON___2/9/2021____ @___9 :00AM____, THIS TEST MUST BE DONE BEFORE SURGERY, COME  [place], [address] , [id number].  ([place]) ONCE YOUR COVID TEST IS COMPLETED, PLEASE BEGIN THE QUARANTINE INSTRUCTIONS AS OUTLINED IN YOUR HANDOUT.                [place]     Your procedure is scheduled on: 07/23/2019   Report to [facility] Main  Entrance   Report to [place] at 5:30AM     Call this number if you have problems the morning of surgery [phone]    Remember: PLEASE FOLLOW ALL BOWEL PREP INSTRUCTIONS (IF ANY)  PROVIDED BY YOUR SURGEON!  DRINK 2 PRESURGERY ENSURE DRINKS THE NIGHT BEFORE SURGERY AT 10:00 PM. NO SOLIDS AFTER MIDNIGHT THE NIGHT PRIOR TO THE SURGERY.  YOU MAY DRINK CLEAR LIQUIDS UNTIL ___4:30AM___ THE NEXT MORNING (SEE DIET BELOW). AT ___4:30AM_____ THE DAY OF SURGERY, FINISH THE LAST PRE-SURGERY DRINK. NOTHING BY MOUTH AFTER THE LAST ENSURE DRINK!      CLEAR LIQUID DIET   Foods Allowed                                                                     Foods Excluded  Coffee and tea, regular and decaf                             liquids that you cannot  Plain Jell-O any favor except red or purple                                           see through such as: Fruit ices (not with fruit pulp)                                     milk, soups, orange juice  Iced Popsicles                                    All solid food Carbonated beverages, regular and diet                                    Cranberry, grape and apple juices Sports drinks like Gatorade Lightly seasoned clear broth or consume(fat free) Sugar, honey syrup  Sample Menu Breakfast                                 Lunch  Supper Cranberry juice                    Beef broth                            Chicken broth Jell-O                                     Grape juice                           Apple juice Coffee or tea                        Jell-O                                      Popsicle                                                Coffee or tea                        Coffee or tea  _____________________________________________________________________    BRUSH YOUR TEETH MORNING OF SURGERY AND RINSE YOUR MOUTH OUT, NO CHEWING GUM CANDY OR MINTS.     Take these medicines the morning of surgery with A SIP OF WATER: NONE                                 You may not have any metal on your body including hair pins and              piercings  Do not wear jewelry, make-up, lotions, powders or perfumes, deodorant             Do not wear nail polish on your fingernails.  Do not shave  48 hours prior to surgery.       Do not bring valuables to the hospital. [place].  Contacts, dentures or bridgework may not be worn into surgery.      Patients discharged the day of surgery will not be allowed to drive home. IF YOU ARE HAVING SURGERY AND GOING HOME THE SAME DAY, YOU MUST HAVE AN ADULT TO DRIVE YOU HOME AND BE WITH YOU FOR 24 HOURS. YOU MAY GO HOME BY TAXI OR UBER OR ORTHERWISE, BUT AN ADULT MUST ACCOMPANY YOU HOME AND STAY WITH YOU FOR 24 HOURS.  Name and phone number of your driver:  Special Instructions: N/A              Please read over the following fact sheets you were given: _____________________________________________________________________    [facility] - Preparing for Surgery Before surgery, you can play an important role.  Because skin is not sterile, your skin needs to be as free of germs as possible.  You can reduce the number of germs on your skin by washing with CHG  (  chlorahexidine gluconate) soap before surgery.  CHG is an antiseptic cleaner which kills germs and bonds with the skin to continue killing germs even after washing. Please DO NOT use if you have an allergy to CHG or antibacterial soaps.  If your skin becomes reddened/irritated stop using the CHG and inform your nurse when you arrive at Short Stay. Do not shave (including legs and underarms) for at least 48 hours prior to the first CHG shower.  You may shave your face/neck. Please follow these instructions carefully:  1.  Shower with CHG Soap the night before surgery and the  morning of Surgery.  2.  If you choose to wash your hair, wash your hair first as usual with your  normal  shampoo.  3.  After you shampoo, rinse your hair and body thoroughly to remove the  shampoo.                           4.  Use CHG as you would any other liquid soap.  You can apply chg directly  to the skin and wash                       Gently with a scrungie or clean washcloth.  5.  Apply the CHG Soap to your body ONLY FROM THE NECK DOWN.   Do not use on face/ open                           Wound or open sores. Avoid contact with eyes, ears mouth and genitals (private parts).                       Wash face,  Genitals (private parts) with your normal soap.             6.  Wash thoroughly, paying special attention to the area where your surgery  will be performed.  7.  Thoroughly rinse your body with warm water from the neck down.  8.  DO NOT shower/wash with your normal soap after using and rinsing off  the CHG Soap.                9.  Pat yourself dry with a clean towel.            10.  Wear clean pajamas.            11.  Place clean sheets on your bed the night of your first shower and do not  sleep with pets. Day of Surgery : Do not apply any lotions/deodorants the morning of surgery.  Please wear clean clothes to the hospital/surgery center.  FAILURE TO FOLLOW THESE INSTRUCTIONS MAY RESULT IN THE CANCELLATION OF  YOUR SURGERY PATIENT SIGNATURE_________________________________  NURSE SIGNATURE__________________________________  ________________________________________________________________________    Adam Phenix  An incentive spirometer is a tool that can help keep your lungs clear and active. This tool measures how well you are filling your lungs with each breath. Taking long deep breaths may help reverse or decrease the chance of developing breathing (pulmonary) problems (especially infection) following:  A long period of time when you are unable to move or be active. BEFORE THE PROCEDURE   If the spirometer includes an indicator to show your best effort, your nurse or respiratory therapist will set it to a desired goal.  If possible, sit up straight or lean  slightly forward. Try not to slouch.  Hold the incentive spirometer in an upright position. INSTRUCTIONS FOR USE  1. Sit on the edge of your bed if possible, or sit up as far as you can in bed or on a chair. 2. Hold the incentive spirometer in an upright position. 3. Breathe out normally. 4. Place the mouthpiece in your mouth and seal your lips tightly around it. 5. Breathe in slowly and as deeply as possible, raising the piston or the ball toward the top of the column. 6. Hold your breath for 3-5 seconds or for as long as possible. Allow the piston or ball to fall to the bottom of the column. 7. Remove the mouthpiece from your mouth and breathe out normally. 8. Rest for a few seconds and repeat Steps 1 through 7 at least 10 times every 1-2 hours when you are awake. Take your time and take a few normal breaths between deep breaths. 9. The spirometer may include an indicator to show your best effort. Use the indicator as a goal to work toward during each repetition. 10. After each set of 10 deep breaths, practice coughing to be sure your lungs are clear. If you have an incision (the cut made at the time of surgery), support your  incision when coughing by placing a pillow or rolled up towels firmly against it. Once you are able to get out of bed, walk around indoors and cough well. You may stop using the incentive spirometer when instructed by your caregiver.  RISKS AND COMPLICATIONS  Take your time so you do not get dizzy or light-headed.  If you are in pain, you may need to take or ask for pain medication before doing incentive spirometry. It is harder to take a deep breath if you are having pain. AFTER USE  Rest and breathe slowly and easily.  It can be helpful to keep track of a log of your progress. Your caregiver can provide you with a simple table to help with this. If you are using the spirometer at home, follow these instructions: [place] IF:   You are having difficultly using the spirometer.  You have trouble using the spirometer as often as instructed.  Your pain medication is not giving enough relief while using the spirometer.  You develop fever of 100.5 F (38.1 C) or higher. SEEK IMMEDIATE MEDICAL CARE IF:   You cough up bloody sputum that had not been present before.  You develop fever of 102 F (38.9 C) or greater.  You develop worsening pain at or near the incision site. MAKE SURE YOU:   Understand these instructions.  Will watch your condition.  Will get help right away if you are not doing well or get worse. Document Released: 10/07/2006 Document Revised: 08/19/2011 Document Reviewed: 12/08/2006 [facility] Patient Information 2014 [place], [place].   ________________________________________________________________________

## 2019-07-19 ENCOUNTER — Other Ambulatory Visit: Payer: Self-pay

## 2019-07-19 ENCOUNTER — Encounter (HOSPITAL_COMMUNITY)
Admission: RE | Admit: 2019-07-19 | Discharge: 2019-07-19 | Disposition: A | Discharge status: Home / Self Care | Payer: Medicaid Other | Source: Ambulatory Visit | Attending: General Surgery | Admitting: General Surgery

## 2019-07-19 ENCOUNTER — Encounter (HOSPITAL_COMMUNITY): Payer: Self-pay | Admitting: Emergency Medicine

## 2019-07-19 NOTE — Progress Notes (Signed)
Patient sesides in SNF:  [facility] in [place], [place]. Patient is alert and oriented x4 , she does ADLs mostly independently. 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    RN spoke with patient today and one of her nurses Amber was present intermittently throughout conversation. Amber says they have not received any bowel prep instructions for this upcoming surgery. RN contacted [facility] , spoke with Audelia Acton and notified her that patient needs to be contacted to clarift if Dr. Marcello Moores ordered a bowel prep.   Patient confirmed she is to come for blood work 2/9 at 0800. Then will go for covid afterward at 0900 at [facility]. Patient to arrive by the facility's bus day of surgery . Bus driver is a CNA and will accompany patient .

## 2019-07-19 NOTE — Progress Notes (Signed)
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 No other deficits. She is alert and oriented x4 .  Resides at [facility] because of unstable living conditions and colostomy care needs ( was homeless before admitted to SNF).    Patient denies shortness of breath, fever, cough and chest pain at PAT appointment   Patient verbalized understanding of instructions that were given to them at the PAT appointment. Patient was also instructed that they will need to review over the PAT instructions again at home before surgery.

## 2019-07-20 ENCOUNTER — Encounter (HOSPITAL_COMMUNITY)
Admission: RE | Admit: 2019-07-20 | Discharge: 2019-07-20 | Disposition: A | Discharge status: Home / Self Care | Payer: Medicaid Other | Source: Ambulatory Visit | Attending: General Surgery | Admitting: General Surgery

## 2019-07-20 ENCOUNTER — Other Ambulatory Visit (HOSPITAL_COMMUNITY)
Admission: RE | Admit: 2019-07-20 | Discharge: 2019-07-20 | Disposition: A | Discharge status: Home / Self Care | Payer: Medicaid Other | Source: Ambulatory Visit | Attending: General Surgery | Admitting: General Surgery

## 2019-07-20 ENCOUNTER — Other Ambulatory Visit (HOSPITAL_COMMUNITY): Payer: Medicaid Other

## 2019-07-20 DIAGNOSIS — Z20822 Contact with and (suspected) exposure to covid-19: Secondary | ICD-10-CM | POA: Insufficient documentation

## 2019-07-20 DIAGNOSIS — Z01812 Encounter for preprocedural laboratory examination: Secondary | ICD-10-CM | POA: Diagnosis present

## 2019-07-20 LAB — BASIC METABOLIC PANEL
Anion gap: 7 (ref 5–15)
BUN: 18 mg/dL (ref 6–20)
CO2: 29 mmol/L (ref 22–32)
Calcium: 9.6 mg/dL (ref 8.9–10.3)
Chloride: 106 mmol/L (ref 98–111)
Creatinine, Ser: 0.72 mg/dL (ref 0.44–1.00)
GFR calc Af Amer: >60 mL/min (ref >60)
GFR calc non Af Amer: >60 mL/min (ref >60)
Glucose, Bld: 94 mg/dL (ref 70–99)
Potassium: 4.6 mmol/L (ref 3.5–5.1)
Sodium: 142 mmol/L (ref 135–145)

## 2019-07-20 LAB — CBC
HCT: 41.4 % (ref 36.0–46.0)
Hemoglobin: 12.9 g/dL (ref 12.0–15.0)
MCH: 30.3 pg (ref 26.0–34.0)
MCHC: 31.2 g/dL (ref 30.0–36.0)
MCV: 97.2 fL (ref 80.0–100.0)
Platelets: 211 10*3/uL (ref 150–400)
RBC: 4.26 MIL/uL (ref 3.87–5.11)
RDW: 12.4 % (ref 11.5–15.5)
WBC: 4.7 10*3/uL (ref 4.0–10.5)
nRBC: 0 % (ref 0.0–0.2)

## 2019-07-20 LAB — SARS CORONAVIRUS 2 (TAT 6-24 HRS): SARS Coronavirus 2: NEGATIVE

## 2019-07-22 MED ORDER — BUPIVACAINE LIPOSOME 1.3 % IJ SUSP
20.0000 mL | INTRAMUSCULAR | Status: DC
  Filled 2019-07-22: qty 20

## 2019-07-22 NOTE — Anesthesia Preprocedure Evaluation (Addendum)
Anesthesia Evaluation  Patient identified by MRN, date of birth, ID band Patient awake    Reviewed: Allergy & Precautions, NPO status , Patient's Chart, lab work & pertinent test results  History of Anesthesia Complications Negative for: history of anesthetic complications  Airway Mallampati: II  TM Distance: >3 FB Neck ROM: Full    Dental  (+) Edentulous Lower, Edentulous Upper   Pulmonary former smoker,    Pulmonary exam normal        Cardiovascular hypertension, Normal cardiovascular exam  Carotid US 2019: right ICA 80-99% stenosis, left ICA 80-99% stenosis  TTE 2019: EF 60-65%, valves OK   Neuro/Psych CVA (left weakness, uses wheelchair), Residual Symptoms negative psych ROS   GI/Hepatic Neg liver ROS, Rectal ca   Endo/Other  negative endocrine ROS  Renal/GU negative Renal ROS  negative genitourinary   Musculoskeletal negative musculoskeletal ROS (+)   Abdominal   Peds  Hematology negative hematology ROS (+)   Anesthesia Other Findings Day of surgery medications reviewed with patient.  Reproductive/Obstetrics negative OB ROS                            Anesthesia Physical Anesthesia Plan  ASA: III  Anesthesia Plan: General   Post-op Pain Management:    Induction: Intravenous  PONV Risk Score and Plan: 4 or greater and Treatment may vary due to age or medical condition, Ondansetron, Dexamethasone and Midazolam  Airway Management Planned: Oral ETT  Additional Equipment: None  Intra-op Plan:   Post-operative Plan: Extubation in OR  Informed Consent: I have reviewed the patients History and Physical, chart, labs and discussed the procedure including the risks, benefits and alternatives for the proposed anesthesia with the patient or authorized representative who has indicated his/her understanding and acceptance.     Dental advisory given  Plan Discussed with:  CRNA  Anesthesia Plan Comments:       Anesthesia Quick Evaluation

## 2019-07-23 ENCOUNTER — Inpatient Hospital Stay (HOSPITAL_COMMUNITY): Payer: Medicaid Other | Admitting: Physician Assistant

## 2019-07-23 ENCOUNTER — Encounter (HOSPITAL_COMMUNITY): Payer: Self-pay | Admitting: General Surgery

## 2019-07-23 ENCOUNTER — Other Ambulatory Visit: Payer: Self-pay

## 2019-07-23 ENCOUNTER — Inpatient Hospital Stay (HOSPITAL_COMMUNITY): Payer: Medicaid Other | Admitting: Anesthesiology

## 2019-07-23 ENCOUNTER — Encounter (HOSPITAL_COMMUNITY)
Admission: RE | Disposition: A | Discharge status: Skilled Nursing Facility | Payer: Self-pay | Source: Skilled Nursing Facility | Attending: General Surgery

## 2019-07-23 ENCOUNTER — Inpatient Hospital Stay (HOSPITAL_COMMUNITY)
Admission: RE | Admit: 2019-07-23 | Discharge: 2019-07-27 | DRG: 330 | Disposition: A | Discharge status: Skilled Nursing Facility | Payer: Medicaid Other | Source: Skilled Nursing Facility | Attending: General Surgery | Admitting: General Surgery

## 2019-07-23 DIAGNOSIS — Z91041 Radiographic dye allergy status: Secondary | ICD-10-CM | POA: Diagnosis not present

## 2019-07-23 DIAGNOSIS — Z801 Family history of malignant neoplasm of trachea, bronchus and lung: Secondary | ICD-10-CM

## 2019-07-23 DIAGNOSIS — Z8249 Family history of ischemic heart disease and other diseases of the circulatory system: Secondary | ICD-10-CM | POA: Diagnosis not present

## 2019-07-23 DIAGNOSIS — C20 Malignant neoplasm of rectum: Secondary | ICD-10-CM | POA: Diagnosis not present

## 2019-07-23 DIAGNOSIS — I10 Essential (primary) hypertension: Secondary | ICD-10-CM | POA: Diagnosis not present

## 2019-07-23 DIAGNOSIS — Z9049 Acquired absence of other specified parts of digestive tract: Secondary | ICD-10-CM | POA: Diagnosis not present

## 2019-07-23 DIAGNOSIS — L309 Dermatitis, unspecified: Secondary | ICD-10-CM | POA: Diagnosis not present

## 2019-07-23 DIAGNOSIS — Z885 Allergy status to narcotic agent status: Secondary | ICD-10-CM | POA: Diagnosis not present

## 2019-07-23 DIAGNOSIS — Z90711 Acquired absence of uterus with remaining cervical stump: Secondary | ICD-10-CM | POA: Diagnosis not present

## 2019-07-23 DIAGNOSIS — Z79899 Other long term (current) drug therapy: Secondary | ICD-10-CM | POA: Diagnosis not present

## 2019-07-23 DIAGNOSIS — Z87891 Personal history of nicotine dependence: Secondary | ICD-10-CM

## 2019-07-23 DIAGNOSIS — I69334 Monoplegia of upper limb following cerebral infarction affecting left non-dominant side: Secondary | ICD-10-CM

## 2019-07-23 DIAGNOSIS — K9413 Enterostomy malfunction: Principal | ICD-10-CM | POA: Diagnosis present

## 2019-07-23 DIAGNOSIS — E785 Hyperlipidemia, unspecified: Secondary | ICD-10-CM | POA: Diagnosis present

## 2019-07-23 DIAGNOSIS — Z7982 Long term (current) use of aspirin: Secondary | ICD-10-CM | POA: Diagnosis not present

## 2019-07-23 DIAGNOSIS — Z91013 Allergy to seafood: Secondary | ICD-10-CM

## 2019-07-23 DIAGNOSIS — Z993 Dependence on wheelchair: Secondary | ICD-10-CM

## 2019-07-23 DIAGNOSIS — K529 Noninfective gastroenteritis and colitis, unspecified: Secondary | ICD-10-CM | POA: Diagnosis present

## 2019-07-23 DIAGNOSIS — Z20822 Contact with and (suspected) exposure to covid-19: Secondary | ICD-10-CM | POA: Diagnosis not present

## 2019-07-23 HISTORY — PX: ILEOSTOMY CLOSURE: SHX1784

## 2019-07-23 SURGERY — CLOSURE, ILEOSTOMY
Anesthesia: General

## 2019-07-23 MED ORDER — ALVIMOPAN 12 MG PO CAPS
12.0000 mg | ORAL_CAPSULE | Freq: Two times a day (BID) | ORAL | Status: DC
  Administered 2019-07-24: 10:00:00 12 mg via ORAL
  Filled 2019-07-23 (×2): qty 1

## 2019-07-23 MED ORDER — BUPIVACAINE LIPOSOME 1.3 % IJ SUSP
INTRAMUSCULAR | Status: DC | PRN
  Administered 2019-07-23: 20 mL

## 2019-07-23 MED ORDER — TRAMADOL HCL 50 MG PO TABS
50.0000 mg | ORAL_TABLET | Freq: Four times a day (QID) | ORAL | Status: DC | PRN
  Administered 2019-07-24 – 2019-07-26 (×3): 50 mg via ORAL
  Filled 2019-07-23 (×6): qty 1

## 2019-07-23 MED ORDER — FENTANYL CITRATE (PF) 100 MCG/2ML IJ SOLN
INTRAMUSCULAR | Status: DC | PRN
  Administered 2019-07-23 (×2): 50 ug via INTRAVENOUS

## 2019-07-23 MED ORDER — BUPIVACAINE-EPINEPHRINE (PF) 0.25% -1:200000 IJ SOLN
INTRAMUSCULAR | Status: DC | PRN
  Administered 2019-07-23: 30 mL

## 2019-07-23 MED ORDER — GABAPENTIN 300 MG PO CAPS
300.0000 mg | ORAL_CAPSULE | ORAL | Status: AC
  Administered 2019-07-23: 06:00:00 300 mg via ORAL
  Filled 2019-07-23: qty 1

## 2019-07-23 MED ORDER — ONDANSETRON HCL 4 MG/2ML IJ SOLN: 4.0000 mg | Freq: Four times a day (QID) | INTRAMUSCULAR | Status: DC | PRN

## 2019-07-23 MED ORDER — KCL IN DEXTROSE-NACL 20-5-0.45 MEQ/L-%-% IV SOLN
INTRAVENOUS | Status: DC
  Filled 2019-07-23: qty 1000

## 2019-07-23 MED ORDER — ROCURONIUM BROMIDE 10 MG/ML (PF) SYRINGE
PREFILLED_SYRINGE | INTRAVENOUS | Status: AC
  Filled 2019-07-23: qty 20

## 2019-07-23 MED ORDER — PROMETHAZINE HCL 25 MG/ML IJ SOLN
6.2500 mg | INTRAMUSCULAR | Status: DC | PRN
  Administered 2019-07-23: 09:00:00 6.25 mg via INTRAVENOUS

## 2019-07-23 MED ORDER — HYDROMORPHONE HCL 1 MG/ML IJ SOLN
0.5000 mg | INTRAMUSCULAR | Status: DC | PRN
  Administered 2019-07-23: 14:00:00 0.5 mg via INTRAVENOUS
  Filled 2019-07-23: qty 0.5

## 2019-07-23 MED ORDER — ALVIMOPAN 12 MG PO CAPS
12.0000 mg | ORAL_CAPSULE | ORAL | Status: AC
  Administered 2019-07-23: 06:00:00 12 mg via ORAL
  Filled 2019-07-23: qty 1

## 2019-07-23 MED ORDER — HYDROMORPHONE HCL 1 MG/ML IJ SOLN
0.2500 mg | INTRAMUSCULAR | Status: DC | PRN
  Administered 2019-07-23: 0.5 mg via INTRAVENOUS

## 2019-07-23 MED ORDER — DIPHENHYDRAMINE HCL 50 MG/ML IJ SOLN: 25.0000 mg | Freq: Four times a day (QID) | INTRAMUSCULAR | Status: DC | PRN

## 2019-07-23 MED ORDER — ACETAMINOPHEN 500 MG PO TABS
1000.0000 mg | ORAL_TABLET | Freq: Four times a day (QID) | ORAL | Status: DC
  Administered 2019-07-23 – 2019-07-27 (×14): 1000 mg via ORAL
  Filled 2019-07-23 (×18): qty 2

## 2019-07-23 MED ORDER — LIDOCAINE 2% (20 MG/ML) 5 ML SYRINGE
INTRAMUSCULAR | Status: DC | PRN
  Administered 2019-07-23: 1.5 mg/kg/h via INTRAVENOUS
  Administered 2019-07-23: 50 mg via INTRAVENOUS

## 2019-07-23 MED ORDER — ENSURE SURGERY PO LIQD
237.0000 mL | Freq: Two times a day (BID) | ORAL | Status: DC
  Administered 2019-07-23 – 2019-07-27 (×7): 237 mL via ORAL
  Filled 2019-07-23 (×10): qty 237

## 2019-07-23 MED ORDER — DIPHENHYDRAMINE HCL 25 MG PO CAPS: 25.0000 mg | ORAL_CAPSULE | Freq: Four times a day (QID) | ORAL | Status: DC | PRN

## 2019-07-23 MED ORDER — FENTANYL CITRATE (PF) 100 MCG/2ML IJ SOLN
INTRAMUSCULAR | Status: AC
  Filled 2019-07-23: qty 2

## 2019-07-23 MED ORDER — ONDANSETRON HCL 4 MG/2ML IJ SOLN
INTRAMUSCULAR | Status: DC | PRN
  Administered 2019-07-23: 4 mg via INTRAVENOUS

## 2019-07-23 MED ORDER — DEXAMETHASONE SODIUM PHOSPHATE 10 MG/ML IJ SOLN
INTRAMUSCULAR | Status: DC | PRN
  Administered 2019-07-23: 8 mg via INTRAVENOUS

## 2019-07-23 MED ORDER — ROCURONIUM BROMIDE 10 MG/ML (PF) SYRINGE
PREFILLED_SYRINGE | INTRAVENOUS | Status: DC | PRN
  Administered 2019-07-23: 50 mg via INTRAVENOUS
  Administered 2019-07-23: 10 mg via INTRAVENOUS

## 2019-07-23 MED ORDER — PROPOFOL 10 MG/ML IV BOLUS
INTRAVENOUS | Status: AC
  Filled 2019-07-23: qty 40

## 2019-07-23 MED ORDER — SODIUM CHLORIDE 0.9 % IV SOLN
2.0000 g | INTRAVENOUS | Status: AC
  Administered 2019-07-23: 08:00:00 2 g via INTRAVENOUS
  Filled 2019-07-23: qty 2

## 2019-07-23 MED ORDER — ONDANSETRON HCL 4 MG PO TABS: 4.0000 mg | ORAL_TABLET | Freq: Four times a day (QID) | ORAL | Status: DC | PRN

## 2019-07-23 MED ORDER — SODIUM CHLORIDE 0.9 % IV SOLN
2.0000 g | Freq: Two times a day (BID) | INTRAVENOUS | Status: AC
  Administered 2019-07-23: 2 g via INTRAVENOUS
  Filled 2019-07-23: qty 2

## 2019-07-23 MED ORDER — DEXAMETHASONE SODIUM PHOSPHATE 10 MG/ML IJ SOLN
INTRAMUSCULAR | Status: AC
  Filled 2019-07-23: qty 2

## 2019-07-23 MED ORDER — PHENYLEPHRINE HCL-NACL 10-0.9 MG/250ML-% IV SOLN
INTRAVENOUS | Status: AC
  Filled 2019-07-23: qty 500

## 2019-07-23 MED ORDER — SACCHAROMYCES BOULARDII 250 MG PO CAPS
250.0000 mg | ORAL_CAPSULE | Freq: Two times a day (BID) | ORAL | Status: DC
  Administered 2019-07-23 – 2019-07-27 (×9): 250 mg via ORAL
  Filled 2019-07-23 (×10): qty 1

## 2019-07-23 MED ORDER — 0.9 % SODIUM CHLORIDE (POUR BTL) OPTIME
TOPICAL | Status: DC | PRN
  Administered 2019-07-23: 2000 mL

## 2019-07-23 MED ORDER — PROMETHAZINE HCL 25 MG/ML IJ SOLN
INTRAMUSCULAR | Status: AC
  Filled 2019-07-23: qty 1

## 2019-07-23 MED ORDER — MIDAZOLAM HCL 2 MG/2ML IJ SOLN
INTRAMUSCULAR | Status: AC
  Filled 2019-07-23: qty 2

## 2019-07-23 MED ORDER — LACTATED RINGERS IV SOLN: INTRAVENOUS | Status: DC

## 2019-07-23 MED ORDER — ONDANSETRON HCL 4 MG/2ML IJ SOLN
INTRAMUSCULAR | Status: AC
  Filled 2019-07-23: qty 4

## 2019-07-23 MED ORDER — ACETAMINOPHEN 500 MG PO TABS
1000.0000 mg | ORAL_TABLET | Freq: Once | ORAL | Status: AC
  Administered 2019-07-23: 06:00:00 1000 mg via ORAL
  Filled 2019-07-23: qty 2

## 2019-07-23 MED ORDER — GABAPENTIN 300 MG PO CAPS
300.0000 mg | ORAL_CAPSULE | Freq: Two times a day (BID) | ORAL | Status: DC
  Administered 2019-07-23 – 2019-07-27 (×9): 300 mg via ORAL
  Filled 2019-07-23 (×9): qty 1

## 2019-07-23 MED ORDER — KETAMINE HCL 10 MG/ML IJ SOLN
INTRAMUSCULAR | Status: AC
  Filled 2019-07-23: qty 1

## 2019-07-23 MED ORDER — PROPOFOL 10 MG/ML IV BOLUS
INTRAVENOUS | Status: DC | PRN
  Administered 2019-07-23: 110 mg via INTRAVENOUS

## 2019-07-23 MED ORDER — HYDROMORPHONE HCL 1 MG/ML IJ SOLN
INTRAMUSCULAR | Status: AC
  Filled 2019-07-23: qty 2

## 2019-07-23 MED ORDER — LIDOCAINE 2% (20 MG/ML) 5 ML SYRINGE
INTRAMUSCULAR | Status: AC
  Filled 2019-07-23: qty 10

## 2019-07-23 MED ORDER — LIDOCAINE HCL 2 % IJ SOLN
INTRAMUSCULAR | Status: AC
  Filled 2019-07-23: qty 40

## 2019-07-23 MED ORDER — MIDAZOLAM HCL 5 MG/5ML IJ SOLN
INTRAMUSCULAR | Status: DC | PRN
  Administered 2019-07-23: 2 mg via INTRAVENOUS

## 2019-07-23 MED ORDER — SUGAMMADEX SODIUM 200 MG/2ML IV SOLN
INTRAVENOUS | Status: DC | PRN
  Administered 2019-07-23: 150 mg via INTRAVENOUS

## 2019-07-23 MED ORDER — BUPIVACAINE HCL 0.25 % IJ SOLN
INTRAMUSCULAR | Status: AC
  Filled 2019-07-23: qty 1

## 2019-07-23 MED ORDER — KETAMINE HCL 10 MG/ML IJ SOLN
INTRAMUSCULAR | Status: DC | PRN
  Administered 2019-07-23: 20 mg via INTRAVENOUS

## 2019-07-23 MED ORDER — ALUM & MAG HYDROXIDE-SIMETH 200-200-20 MG/5ML PO SUSP: 30.0000 mL | Freq: Four times a day (QID) | ORAL | Status: DC | PRN

## 2019-07-23 MED ORDER — ENOXAPARIN SODIUM 40 MG/0.4ML ~~LOC~~ SOLN
40.0000 mg | SUBCUTANEOUS | Status: DC
  Administered 2019-07-24 – 2019-07-27 (×4): 40 mg via SUBCUTANEOUS
  Filled 2019-07-23 (×4): qty 0.4

## 2019-07-23 MED ORDER — ATORVASTATIN CALCIUM 40 MG PO TABS
80.0000 mg | ORAL_TABLET | Freq: Every day | ORAL | Status: DC
  Administered 2019-07-23 – 2019-07-26 (×4): 80 mg via ORAL
  Filled 2019-07-23 (×4): qty 2

## 2019-07-23 MED ORDER — PHENYLEPHRINE HCL-NACL 10-0.9 MG/250ML-% IV SOLN
INTRAVENOUS | Status: DC | PRN
  Administered 2019-07-23: 25 ug/min via INTRAVENOUS

## 2019-07-23 SURGICAL SUPPLY — 50 items
APL PRP STRL LF DISP 70% ISPRP (MISCELLANEOUS) ×1
BLADE HEX COATED 2.75 (ELECTRODE) ×3 IMPLANT
CHLORAPREP W/TINT 26 (MISCELLANEOUS) ×3 IMPLANT
COVER MAYO STAND STRL (DRAPES) ×3 IMPLANT
COVER WAND RF STERILE (DRAPES) IMPLANT
DRAPE LAPAROSCOPIC ABDOMINAL (DRAPES) ×3 IMPLANT
DRAPE UTILITY XL STRL (DRAPES) IMPLANT
DRAPE WARM FLUID 44X44 (DRAPES) ×3 IMPLANT
DRSG OPSITE POSTOP 4X10 (GAUZE/BANDAGES/DRESSINGS) IMPLANT
DRSG OPSITE POSTOP 4X6 (GAUZE/BANDAGES/DRESSINGS) IMPLANT
DRSG OPSITE POSTOP 4X8 (GAUZE/BANDAGES/DRESSINGS) IMPLANT
DRSG TELFA 3X8 NADH (GAUZE/BANDAGES/DRESSINGS) ×3 IMPLANT
ELECT REM PT RETURN 15FT ADLT (MISCELLANEOUS) ×3 IMPLANT
GAUZE SPONGE 4X4 12PLY STRL (GAUZE/BANDAGES/DRESSINGS) ×3 IMPLANT
GLOVE BIO SURGEON STRL SZ 6.5 (GLOVE) ×4 IMPLANT
GLOVE BIO SURGEONS STRL SZ 6.5 (GLOVE) ×2
GLOVE BIOGEL PI IND STRL 7.0 (GLOVE) ×1 IMPLANT
GLOVE BIOGEL PI INDICATOR 7.0 (GLOVE) ×2
GOWN STRL REUS W/TWL XL LVL3 (GOWN DISPOSABLE) ×6 IMPLANT
HANDLE SUCTION POOLE (INSTRUMENTS) ×1 IMPLANT
HOLDER FOLEY CATH W/STRAP (MISCELLANEOUS) IMPLANT
KIT BASIN OR (CUSTOM PROCEDURE TRAY) ×3 IMPLANT
KIT TURNOVER KIT A (KITS) ×2 IMPLANT
MANIFOLD NEPTUNE II (INSTRUMENTS) ×3 IMPLANT
PACK GENERAL/GYN (CUSTOM PROCEDURE TRAY) ×3 IMPLANT
PAD DRESSING TELFA 3X8 NADH (GAUZE/BANDAGES/DRESSINGS) IMPLANT
PENCIL SMOKE EVACUATOR (MISCELLANEOUS) ×3 IMPLANT
RELOAD PROXIMATE 75MM BLUE (ENDOMECHANICALS) ×3 IMPLANT
RELOAD STAPLE 75 3.8 BLU REG (ENDOMECHANICALS) IMPLANT
SPONGE LAP 18X18 RF (DISPOSABLE) IMPLANT
STAPLER GUN LINEAR PROX 60 (STAPLE) ×2 IMPLANT
STAPLER PROXIMATE 75MM BLUE (STAPLE) ×2 IMPLANT
SUCTION POOLE HANDLE (INSTRUMENTS) ×3
SUT NOVA NAB DX-16 0-1 5-0 T12 (SUTURE) ×4 IMPLANT
SUT NOVA NAB GS-21 0 18 T12 DT (SUTURE) IMPLANT
SUT PROLENE 2 0 BLUE (SUTURE) IMPLANT
SUT SILK 2 0 (SUTURE) ×3
SUT SILK 2 0 SH CR/8 (SUTURE) ×3 IMPLANT
SUT SILK 2-0 18XBRD TIE 12 (SUTURE) ×1 IMPLANT
SUT SILK 3 0 (SUTURE) ×3
SUT SILK 3 0 SH CR/8 (SUTURE) ×3 IMPLANT
SUT SILK 3-0 18XBRD TIE 12 (SUTURE) ×1 IMPLANT
SUT VIC AB 2-0 SH 18 (SUTURE) IMPLANT
SUT VIC AB 2-0 SH 27 (SUTURE) ×6
SUT VIC AB 2-0 SH 27X BRD (SUTURE) ×2 IMPLANT
SUT VICRYL 4-0 PS2 18IN ABS (SUTURE) ×3 IMPLANT
TAPE PAPER 3X10 WHT MICROPORE (GAUZE/BANDAGES/DRESSINGS) ×2 IMPLANT
TOWEL OR 17X26 10 PK STRL BLUE (TOWEL DISPOSABLE) ×4 IMPLANT
TOWEL OR NON WOVEN STRL DISP B (DISPOSABLE) ×4 IMPLANT
YANKAUER SUCT BULB TIP NO VENT (SUCTIONS) ×3 IMPLANT

## 2019-07-23 NOTE — H&P (Signed)
HPI: Pt here for ileostomy reversal after LAR.    Past Medical History:  Diagnosis Date  . Disorder of gallbladder 2000   pt states gallbladder was removed  . Essential hypertension   . HLD (hyperlipidemia)   . S/P partial hysterectomy 2004   "whole right side removed"  . Stroke ([place]) 05/10/2014   left arm is still paralyzed , uses wheelchair   . Tobacco abuse   . Tubal ligation status 2000   "tied and bunt"   Past Surgical History:  Procedure Laterality Date  . ABDOMINAL HYSTERECTOMY     patient states she has ONLY had her right ovary and fallopian tube removed   . CHOLECYSTECTOMY    . COLONOSCOPY  07/2018  . ENDARTERECTOMY Right 02/17/2018   Procedure: Right Carotid  ENDARTERECTOMY;  Surgeon: Waynetta Sandy, MD;  Location: [facility];  Service: Vascular;  Laterality: Right;  . XI ROBOTIC ASSISTED LOWER ANTERIOR RESECTION N/A 12/10/2018   Procedure: XI ROBOTIC ASSISTED LOWER ANTERIOR RESECTION WITH LOOP ILEOSTOMY;  Surgeon: Leighton Ruff, MD;  Location: [facility];  Service: General;  Laterality: N/A;   Family History  Problem Relation Age of Onset  . Hypertension Mother   . Lung cancer Father    Social History   Socioeconomic History  . Marital status: Legally Separated    Spouse name: Not on file  . Number of children: Not on file  . Years of education: Not on file  . Highest education level: Not on file  Occupational History  . Not on file  Tobacco Use  . Smoking status: Former Smoker    Packs/day: 1.00    Years: 34.00    Pack years: 34.00    Types: Cigarettes    Quit date: 11/2018    Years since quitting: 0.7  . Smokeless tobacco: Never Used  Substance and Sexual Activity  . Alcohol use: Never  . Drug use: Never  . Sexual activity: Not Currently  Other Topics Concern  . Not on file  Social History Narrative  . Not on file   Social Determinants of Health   Financial Resource Strain:   . Difficulty of Paying Living Expenses: Not on file  Food  Insecurity:   . Worried About Charity fundraiser in the Last Year: Not on file  . Ran Out of Food in the Last Year: Not on file  Transportation Needs:   . Lack of Transportation (Medical): Not on file  . Lack of Transportation (Non-Medical): Not on file  Physical Activity:   . Days of Exercise per Week: Not on file  . Minutes of Exercise per Session: Not on file  Stress:   . Feeling of Stress : Not on file  Social Connections:   . Frequency of Communication with Friends and Family: Not on file  . Frequency of Social Gatherings with Friends and Family: Not on file  . Attends Religious Services: Not on file  . Active Member of Clubs or Organizations: Not on file  . Attends Archivist Meetings: Not on file  . Marital Status: Not on file  Intimate Partner Violence:   . Fear of Current or Ex-Partner: Not on file  . Emotionally Abused: Not on file  . Physically Abused: Not on file  . Sexually Abused: Not on file   Current Meds  Medication Sig  . acetaminophen (TYLENOL) 500 MG tablet Take 1,000 mg by mouth every 4 (four) hours as needed for moderate pain.   Marland Kitchen ascorbic acid (  RA VITAMIN C) 500 MG tablet Take 500 mg by mouth 2 (two) times daily. (0900 & 2100)  . aspirin 81 MG chewable tablet Chew 81 mg by mouth daily at 6 PM. (1700)  . atorvastatin (LIPITOR) 80 MG tablet Take 1 tablet (80 mg total) by mouth daily at 6 PM. (Patient taking differently: Take 80 mg by mouth at bedtime. (2000))  . B Complex-C (B-COMPLEX WITH VITAMIN C) tablet Take 1 tablet by mouth daily.  . cholecalciferol (VITAMIN D) 25 MCG (1000 UNIT) tablet Take 2,000 Units by mouth daily.  Marland Kitchen loperamide (IMODIUM) 2 MG capsule Take 2 capsules (4 mg total) by mouth 4 (four) times daily -  before meals and at bedtime. (Patient taking differently: Take 4 mg by mouth 2 (two) times daily. (0900 & 1300))  . traZODone (DESYREL) 50 MG tablet Take 75 mg by mouth at bedtime. (2000)  . vitamin B-12 (CYANOCOBALAMIN) 50 MCG  tablet Take 50 mcg by mouth daily at 10 pm. (2100)  . Zinc 50 MG TABS Take 50 mg by mouth daily.   Allergies  Allergen Reactions  . Ivp Dye [Iodinated Diagnostic Agents] Shortness Of Breath and Rash  . Codeine Nausea And Vomiting  . Influenza Vaccine Live Rash  . Morphine And Related Rash  . Pneumococcal Vaccines Rash  . Shrimp [Shellfish Allergy] Nausea And Vomiting   Review of Systems - General ROS: negative for - chills or fever Respiratory ROS: no cough, shortness of breath, or wheezing Cardiovascular ROS: no chest pain or dyspnea on exertion Gastrointestinal ROS: no abdominal pain, change in bowel habits, or black or bloody stools   BP 135/71   Pulse 86   Temp 98.2 F (36.8 C) (Oral)   Resp 16   LMP 07/18/2014   SpO2 94%  Physical Exam Constitutional:      General: She is not in acute distress.    Appearance: Normal appearance.  HENT:     Head: Normocephalic and atraumatic.     Nose: Nose normal.     Mouth/Throat:     Mouth: Mucous membranes are moist.  Eyes:     Extraocular Movements: Extraocular movements intact.     Conjunctiva/sclera: Conjunctivae normal.     Pupils: Pupils are equal, round, and reactive to light.  Cardiovascular:     Rate and Rhythm: Normal rate and regular rhythm.  Pulmonary:     Effort: Pulmonary effort is normal. No respiratory distress.  Abdominal:     General: Abdomen is flat. There is no distension.     Palpations: Abdomen is soft.     Tenderness: There is no abdominal tenderness.  Musculoskeletal:        General: Normal range of motion.     Cervical back: Normal range of motion and neck supple.  Skin:    General: Skin is warm and dry.  Neurological:     Mental Status: She is alert.    Ready for ostomy reversal.  Her anastomosis has healed well without stricture.  Risks of surgery include bleeding, infection and damage to adjacent structures.  All questions answered.    Rosario Adie, MD  [facility]

## 2019-07-23 NOTE — Anesthesia Procedure Notes (Signed)
Procedure Name: Intubation Date/Time: 07/23/2019 7:48 AM Performed by: Lavina Hamman, CRNA Pre-anesthesia Checklist: Patient identified, Emergency Drugs available, Suction available, Patient being monitored and Timeout performed Patient Re-evaluated:Patient Re-evaluated prior to induction Oxygen Delivery Method: Circle system utilized Preoxygenation: Pre-oxygenation with 100% oxygen Induction Type: IV induction Ventilation: Mask ventilation without difficulty Laryngoscope Size: Mac and 3 Grade View: Grade I Tube type: Oral Tube size: 7.0 mm Number of attempts: 1 Airway Equipment and Method: Stylet Placement Confirmation: ETT inserted through vocal cords under direct vision,  positive ETCO2,  CO2 detector and breath sounds checked- equal and bilateral Secured at: 21 cm Tube secured with: Tape Dental Injury: Teeth and Oropharynx as per pre-operative assessment

## 2019-07-23 NOTE — Anesthesia Postprocedure Evaluation (Signed)
Anesthesia Post Note  Patient: Jill Williams  Procedure(s) Performed: ILEOSTOMY REVERSAL (N/A )     Patient location during evaluation: PACU Anesthesia Type: General Level of consciousness: awake and alert and oriented Pain management: pain level controlled Vital Signs Assessment: post-procedure vital signs reviewed and stable Respiratory status: spontaneous breathing, nonlabored ventilation and respiratory function stable Cardiovascular status: blood pressure returned to baseline Postop Assessment: no apparent nausea or vomiting Anesthetic complications: no    Last Vitals:  Vitals:   07/23/19 0900 07/23/19 0915  BP: (!) 168/91 (!) 179/96  Pulse: 90 90  Resp: 19 14  Temp:    SpO2: 100% 100%    Last Pain:  Vitals:   07/23/19 0905  TempSrc:   PainSc: [place]

## 2019-07-23 NOTE — Transfer of Care (Signed)
Immediate Anesthesia Transfer of Care Note  Patient: Jill Williams  Procedure(s) Performed: Procedure(s): ILEOSTOMY REVERSAL (N/A)  Patient Location: PACU  Anesthesia Type:General  Level of Consciousness:  sedated, patient cooperative and responds to stimulation  Airway & Oxygen Therapy:Patient Spontanous Breathing and Patient connected to face mask oxgen  Post-op Assessment:  Report given to PACU RN and Post -op Vital signs reviewed and stable  Post vital signs:  Reviewed and stable  Last Vitals:  Vitals:   07/23/19 0600 07/23/19 0847  BP: 135/71 (!) 179/111  Pulse: 86 77  Resp: 16 11  Temp: 36.8 C (!) (P) 36.4 C  SpO2: [id number] [id number]    Complications: No apparent anesthesia complications

## 2019-07-23 NOTE — Op Note (Signed)
07/23/2019  8:59 AM  PATIENT:  Jill Williams  50 y.o. female  Patient Care Team: Maris Berger, MD as PCP - General (Family Medicine) Maris Berger, MD (Family Medicine)  PRE-OPERATIVE DIAGNOSIS:  RECTAL CANCER  POST-OPERATIVE DIAGNOSIS:  RECTAL CANCER  PROCEDURE:   ILEOSTOMY REVERSAL   Surgeon(s): Leighton Ruff, MD Ileana Roup, MD  ASSISTANT: Dr Dema Severin   ANESTHESIA:   local and general  EBL: 1ml  Total I/O In: 1000 [I.V.:1000] Out: 5 [Blood:5]  DRAINS: none   SPECIMEN:  Source of Specimen:  ileostomy  DISPOSITION OF SPECIMEN:  PATHOLOGY  COUNTS:  YES  PLAN OF CARE: Admit to inpatient   PATIENT DISPOSITION:  PACU - hemodynamically stable.  INDICATION: 49 year old female status post low anterior resection with diverting ileostomy.  Her anastomosis has healed well and she is ready for ileostomy reversal.   OR FINDINGS: Significant peristomal irritation consistent with peristomal dermatitis.  DESCRIPTION: the patient was identified in the preoperative holding area and taken to the OR where they were laid supine on the operating room table.  General anesthesia was induced without difficulty. SCDs were also noted to be in place prior to the initiation of anesthesia.  The patient was then prepped and draped in the usual sterile fashion.   A surgical timeout was performed indicating the correct patient, procedure, positioning and need for preoperative antibiotics.   I began by making a circular incision around the ileostomy using electrocautery.  Dissection was carried down through the subcutaneous tissues using electrocautery and blunt dissection.  The fascia was dissected free from the ileostomy using Metzenbaum scissors and blunt dissection.  I then freed the rectus from the ileostomy and was able to enter into the peritoneum.  The adhesions were cleared circumferentially using Metzenbaum scissors and electrocautery.  The ileostomy was brought out  of the wound.  A small enterotomy was made in the proximal and distal limbs.  A 75 mm GIA blue load stapler was inserted and an anastomosis was created without difficulty.  The common enterotomy channel was closed using a 60 mm TA blue load stapler.  The edges of the ileostomy staple line were imbricated using 3-0 silk sutures.  An antitension suture was also placed using a 3-0 silk suture.  Hemostasis was good.  This was then placed back into the abdomen.  The fascia was then closed using interrupted #1 and 0 Novafil sutures.  Subcutaneous tissue was reapproximated using a pursestring 2-0 Vicryl suture.  The dermal layer was also partially closed using a 2-0 Vicryl pursestring suture.  A Telfa wick was placed in the middle and a dressing was applied.  The patient was then awakened from anesthesia and sent to the postanesthesia care unit in stable condition.  All counts were correct per operating room staff.

## 2019-07-24 LAB — CBC
HCT: 34.8 % — ABNORMAL LOW (ref 36.0–46.0)
Hemoglobin: 11.5 g/dL — ABNORMAL LOW (ref 12.0–15.0)
MCH: 31 pg (ref 26.0–34.0)
MCHC: 33 g/dL (ref 30.0–36.0)
MCV: 93.8 fL (ref 80.0–100.0)
Platelets: 203 10*3/uL (ref 150–400)
RBC: 3.71 MIL/uL — ABNORMAL LOW (ref 3.87–5.11)
RDW: 12.3 % (ref 11.5–15.5)
WBC: 8.6 10*3/uL (ref 4.0–10.5)
nRBC: 0 % (ref 0.0–0.2)

## 2019-07-24 LAB — BASIC METABOLIC PANEL
Anion gap: 8 (ref 5–15)
BUN: 16 mg/dL (ref 6–20)
CO2: 27 mmol/L (ref 22–32)
Calcium: 9.4 mg/dL (ref 8.9–10.3)
Chloride: 107 mmol/L (ref 98–111)
Creatinine, Ser: 0.69 mg/dL (ref 0.44–1.00)
GFR calc Af Amer: >60 mL/min (ref >60)
GFR calc non Af Amer: >60 mL/min (ref >60)
Glucose, Bld: 128 mg/dL — ABNORMAL HIGH (ref 70–99)
Potassium: 3.8 mmol/L (ref 3.5–5.1)
Sodium: 142 mmol/L (ref 135–145)

## 2019-07-24 NOTE — Plan of Care (Signed)

## 2019-07-24 NOTE — Progress Notes (Signed)
1 Day Post-Op   Subjective/Chief Complaint: No pain. Tolerating PO, no nausea. Passing flatus, reports small liquid bm   Objective: Vital signs in last 24 hours: Temp:  [97.4 F (36.3 C)-99.1 F (37.3 C)] 98.2 F (36.8 C) (02/13 [id number]) Pulse Rate:  [77-104] 89 (02/13 0638) Resp:  [10-19] 16 (02/13 0638) BP: (126-179)/(74-111) 154/77 (02/13 0638) SpO2:  [97 %-100 %] 98 % (02/13 [id number]) Weight:  [61 kg] 61 kg (02/13 [id number])    Intake/Output from previous day: 02/12 0701 - 02/13 0700 In: 3305.4 [P.O.:1620; I.V.:1585.4; IV Piggyback:100] Out: [id number] [Urine:1450; Blood:5] Intake/Output this shift: No intake/output data recorded.  General appearance: alert and cooperative Resp: unlabored GI: soft, nondistended, minimally tender around incision which is packed with scant serosang drainage  Lab Results:  Recent Labs    07/24/19 0329  WBC 8.6  HGB 11.5*  HCT 34.8*  PLT 203   BMET Recent Labs    07/24/19 0329  NA 142  K 3.8  CL 107  CO2 27  GLUCOSE 128*  BUN 16  CREATININE 0.69  CALCIUM 9.4   PT/INR No results for input(s): LABPROT, INR in the last 72 hours. ABG No results for input(s): PHART, HCO3 in the last 72 hours.  Invalid input(s): PCO2, PO2  Studies/Results: No results found.  Anti-infectives: Anti-infectives (From admission, onward)   Start     Dose/Rate Route Frequency Ordered Stop   07/23/19 2000  cefoTEtan (CEFOTAN) 2 g in sodium chloride 0.9 % 100 mL IVPB     2 g 200 mL/hr over 30 Minutes Intravenous Every 12 hours 07/23/19 0941 07/23/19 2032   07/23/19 0600  cefoTEtan (CEFOTAN) 2 g in sodium chloride 0.9 % 100 mL IVPB     2 g 200 mL/hr over 30 Minutes Intravenous On call to O.R. 07/23/19 0543 07/23/19 0741      Assessment/Plan: S/p loop ileostomy reversal 2/12 Advance diet Mobilize Possible DC tomorrow, pending progress  LOS: 1 day    Jill Williams 07/24/2019

## 2019-07-25 LAB — BASIC METABOLIC PANEL
Anion gap: 8 (ref 5–15)
BUN: 14 mg/dL (ref 6–20)
CO2: 27 mmol/L (ref 22–32)
Calcium: 9.2 mg/dL (ref 8.9–10.3)
Chloride: 110 mmol/L (ref 98–111)
Creatinine, Ser: 0.6 mg/dL (ref 0.44–1.00)
GFR calc Af Amer: >60 mL/min (ref >60)
GFR calc non Af Amer: >60 mL/min (ref >60)
Glucose, Bld: 84 mg/dL (ref 70–99)
Potassium: 3.8 mmol/L (ref 3.5–5.1)
Sodium: 145 mmol/L (ref 135–145)

## 2019-07-25 LAB — CBC
HCT: 33.2 % — ABNORMAL LOW (ref 36.0–46.0)
Hemoglobin: 10.2 g/dL — ABNORMAL LOW (ref 12.0–15.0)
MCH: 30.2 pg (ref 26.0–34.0)
MCHC: 30.7 g/dL (ref 30.0–36.0)
MCV: 98.2 fL (ref 80.0–100.0)
Platelets: 185 10*3/uL (ref 150–400)
RBC: 3.38 MIL/uL — ABNORMAL LOW (ref 3.87–5.11)
RDW: 12.4 % (ref 11.5–15.5)
WBC: 5.4 10*3/uL (ref 4.0–10.5)
nRBC: 0 % (ref 0.0–0.2)

## 2019-07-25 LAB — MAGNESIUM: Magnesium: 1.8 mg/dL (ref 1.7–2.4)

## 2019-07-25 NOTE — Progress Notes (Signed)
2 Days Post-Op   Subjective/Chief Complaint: No pain. Tolerating PO, no nausea. Multiple liquid stools yesterday with some incontinence, somewhat better today   Objective: Vital signs in last 24 hours: Temp:  [97.5 F (36.4 C)-98.6 F (37 C)] 98.6 F (37 C) (02/14 0500) Pulse Rate:  [77-88] 77 (02/14 0500) Resp:  [14-16] 16 (02/14 0500) BP: (109-137)/(65-91) 109/66 (02/14 0500) SpO2:  [99 %-100 %] 100 % (02/14 0500) Last BM Date: 07/24/19  Intake/Output from previous day: 02/13 0701 - 02/14 0700 In: 1160.7 [P.O.:794; I.V.:366.7] Out: 251 [Urine:250; Stool:1] Intake/Output this shift: No intake/output data recorded.  General appearance: alert and cooperative Resp: unlabored GI: soft, nondistended, minimally tender around incision which is packed with scant serosang drainage  Lab Results:  Recent Labs    07/24/19 0329 07/25/19 0446  WBC 8.6 5.4  HGB 11.5* 10.2*  HCT 34.8* 33.2*  PLT 203 185   BMET Recent Labs    07/24/19 0329 07/25/19 0446  NA 142 145  K 3.8 3.8  CL 107 110  CO2 27 27  GLUCOSE 128* 84  BUN 16 14  CREATININE 0.69 0.60  CALCIUM 9.4 9.2   PT/INR No results for input(s): LABPROT, INR in the last 72 hours. ABG No results for input(s): PHART, HCO3 in the last 72 hours.  Invalid input(s): PCO2, PO2  Studies/Results: No results found.  Anti-infectives: Anti-infectives (From admission, onward)   Start     Dose/Rate Route Frequency Ordered Stop   07/23/19 2000  cefoTEtan (CEFOTAN) 2 g in sodium chloride 0.9 % 100 mL IVPB     2 g 200 mL/hr over 30 Minutes Intravenous Every 12 hours 07/23/19 0941 07/23/19 2032   07/23/19 0600  cefoTEtan (CEFOTAN) 2 g in sodium chloride 0.9 % 100 mL IVPB     2 g 200 mL/hr over 30 Minutes Intravenous On call to O.R. 07/23/19 0543 07/23/19 0741      Assessment/Plan: S/p loop ileostomy reversal 2/12 Advance diet Mobilize Plan discharge tomorrow as long as no further diarrhea   LOS: 2 days    Jill Williams 07/25/2019

## 2019-07-26 LAB — CBC
HCT: 33.9 % — ABNORMAL LOW (ref 36.0–46.0)
Hemoglobin: 10.6 g/dL — ABNORMAL LOW (ref 12.0–15.0)
MCH: 29.8 pg (ref 26.0–34.0)
MCHC: 31.3 g/dL (ref 30.0–36.0)
MCV: 95.2 fL (ref 80.0–100.0)
Platelets: 182 10*3/uL (ref 150–400)
RBC: 3.56 MIL/uL — ABNORMAL LOW (ref 3.87–5.11)
RDW: 12.3 % (ref 11.5–15.5)
WBC: 4.7 10*3/uL (ref 4.0–10.5)
nRBC: 0 % (ref 0.0–0.2)

## 2019-07-26 LAB — BASIC METABOLIC PANEL
Anion gap: 7 (ref 5–15)
BUN: 14 mg/dL (ref 6–20)
CO2: 26 mmol/L (ref 22–32)
Calcium: 9.1 mg/dL (ref 8.9–10.3)
Chloride: 110 mmol/L (ref 98–111)
Creatinine, Ser: 0.58 mg/dL (ref 0.44–1.00)
GFR calc Af Amer: >60 mL/min (ref >60)
GFR calc non Af Amer: >60 mL/min (ref >60)
Glucose, Bld: 84 mg/dL (ref 70–99)
Potassium: 3.5 mmol/L (ref 3.5–5.1)
Sodium: 143 mmol/L (ref 135–145)

## 2019-07-26 LAB — SARS CORONAVIRUS 2 (TAT 6-24 HRS): SARS Coronavirus 2: NEGATIVE

## 2019-07-26 MED ORDER — LOPERAMIDE HCL 2 MG PO CAPS: 2.0000 mg | ORAL_CAPSULE | ORAL | 0 refills | Status: AC | PRN

## 2019-07-26 MED ORDER — TRAMADOL HCL 50 MG PO TABS: 50.0000 mg | ORAL_TABLET | Freq: Four times a day (QID) | ORAL | 0 refills | Status: DC | PRN

## 2019-07-26 MED ORDER — LOPERAMIDE HCL 2 MG PO CAPS
2.0000 mg | ORAL_CAPSULE | ORAL | Status: DC | PRN
  Administered 2019-07-26 – 2019-07-27 (×4): 2 mg via ORAL
  Filled 2019-07-26 (×4): qty 1

## 2019-07-26 NOTE — Progress Notes (Signed)
3 Days Post-Op ileostomy reversal  Subjective/Chief Complaint: No pain. Tolerating PO, no nausea. Multiple liquid stools yesterday with some incontinence, getting better    Objective: Vital signs in last 24 hours: Temp:  [98.7 F (37.1 C)-98.9 F (37.2 C)] 98.9 F (37.2 C) (02/15 0523) Pulse Rate:  [85-95] 90 (02/15 0523) Resp:  [16] 16 (02/15 0523) BP: (116-134)/(67-74) 117/68 (02/15 0523) SpO2:  [97 %-99 %] 97 % (02/15 0523) Last BM Date: 07/26/19  Intake/Output from previous day: 02/14 0701 - 02/15 0700 In: 840 [P.O.:840] Out: -  Intake/Output this shift: No intake/output data recorded.  General appearance: alert and cooperative Resp: unlabored GI: soft, nondistended, minimally tender around incision which is packed with scant serosang drainage  Lab Results:  Recent Labs    07/25/19 0446 07/26/19 0402  WBC 5.4 4.7  HGB 10.2* 10.6*  HCT 33.2* 33.9*  PLT 185 182   BMET Recent Labs    07/25/19 0446 07/26/19 0402  NA 145 143  K 3.8 3.5  CL 110 110  CO2 27 26  GLUCOSE 84 84  BUN 14 14  CREATININE 0.60 0.58  CALCIUM 9.2 9.1   PT/INR No results for input(s): LABPROT, INR in the last 72 hours. ABG No results for input(s): PHART, HCO3 in the last 72 hours.  Invalid input(s): PCO2, PO2  Studies/Results: No results found.  Anti-infectives: Anti-infectives (From admission, onward)   Start     Dose/Rate Route Frequency Ordered Stop   07/23/19 2000  cefoTEtan (CEFOTAN) 2 g in sodium chloride 0.9 % 100 mL IVPB     2 g 200 mL/hr over 30 Minutes Intravenous Every 12 hours 07/23/19 0941 07/23/19 2032   07/23/19 0600  cefoTEtan (CEFOTAN) 2 g in sodium chloride 0.9 % 100 mL IVPB     2 g 200 mL/hr over 30 Minutes Intravenous On call to O.R. 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

## 2019-07-26 NOTE — TOC Progression Note (Signed)
Transition of Care [facility]) - Progression Note    Patient Details  Name: Jill Williams MRN: [id number] Date of Birth: 08-Feb-1971  Transition of Care [facility]) CM/SW [place], [place] Phone Number: 07/26/2019, 1:11 PM  Clinical Narrative:    Pending a negative covid test, the facility will transport the patient tomorrow between 12:00pm-1:00pm.    Expected Discharge Plan: Skilled Nursing Facility Barriers to Discharge: Other (comment)(Covid test)  Expected Discharge Plan and Services Expected Discharge Plan: [place] In-house Referral: Clinical Social Work Discharge Planning Services: CM Consult Post Acute Care Choice: NA Living arrangements for the past 2 months: [place] Expected Discharge Date: 07/26/19                                   Social Determinants of Health (SDOH) Interventions    Readmission Risk Interventions No flowsheet data found.

## 2019-07-26 NOTE — Discharge Summary (Signed)
Physician Discharge Summary  Patient ID: Jill Williams MRN: [id number] DOB/AGE: [birth year] 51 y.o.  Admit date: 07/23/2019 Discharge date: 07/26/2019  Admission Diagnoses: ileostomy dysfunction, rectal cancer  Discharge Diagnoses:  Active Problems:   Ileostomy dysfunction (HCC) Rectal cancer  Discharged Condition: good  Hospital Course: Patient admitted after surgery. Her diet was advanced as tolerated. Once she was able to tolerate a solid diet and have good bowel function, she was felt to be in stable condition for discharge back to her skilled nursing facility. She was given a prescription for tramadol for pain. She can use Imodium as needed for diarrhea. 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  Consults: None  Significant Diagnostic Studies: labs: cbc, bmet  Treatments: IV hydration, analgesia: acetaminophen and surgery: ileostomy reversal  Discharge Exam: Blood pressure 117/68, pulse 90, temperature 98.9 F (37.2 C), temperature source Oral, resp. rate 16, weight 61 kg, last menstrual period 07/18/2014, SpO2 97 %. General appearance: alert and cooperative GI: normal findings: soft, non-tender Incision/Wound: clean  Disposition: SNF   Allergies as of 07/26/2019      Reactions   Ivp Dye [iodinated Diagnostic Agents] Shortness Of Breath, Rash   Codeine Nausea And Vomiting   Influenza Vaccine Live Rash   Morphine And Related Rash   Pneumococcal Vaccines Rash   Shrimp [shellfish Allergy] Nausea And Vomiting      Medication List    TAKE these medications   acetaminophen 500 MG tablet Commonly known as: TYLENOL Take 1,000 mg by mouth every 4 (four) hours as needed for moderate pain.   aspirin 81 MG chewable tablet Chew 81 mg by mouth daily at 6 PM. (1700)   atorvastatin 80 MG tablet Commonly known as: LIPITOR Take 1 tablet (80 mg total) by mouth daily at 6 PM. What changed:   when to take this  additional  instructions   B-complex with vitamin C tablet Take 1 tablet by mouth daily.   cholecalciferol 25 MCG (1000 UNIT) tablet Commonly known as: VITAMIN D Take 2,000 Units by mouth daily.   loperamide 2 MG capsule Commonly known as: IMODIUM Take 1 capsule (2 mg total) by mouth as needed for diarrhea or loose stools. What changed:   how much to take  when to take this  reasons to take this   ondansetron 4 MG tablet Commonly known as: ZOFRAN Take 4 mg by mouth every 6 (six) hours as needed for nausea or vomiting.   prochlorperazine 10 MG tablet Commonly known as: COMPAZINE Take 10 mg by mouth every 6 (six) hours as needed for nausea or vomiting.   RA Vitamin C 500 MG tablet Generic drug: ascorbic acid Take 500 mg by mouth 2 (two) times daily. (0900 & 2100)   traMADol 50 MG tablet Commonly known as: ULTRAM Take 1 tablet (50 mg total) by mouth every 6 (six) hours as needed (mild pain).   traZODone 50 MG tablet Commonly known as: DESYREL Take 75 mg by mouth at bedtime. (2000)   vitamin B-12 50 MCG tablet Commonly known as: CYANOCOBALAMIN Take 50 mcg by mouth daily at 10 pm. (2100)   Zinc 50 MG Tabs Take 50 mg by mouth daily.        Signed: Rosario Adie 0000000, 8:53 AM

## 2019-07-26 NOTE — TOC Initial Note (Signed)
Transition of Care [facility]) - Initial/Assessment Note    Patient Details  Name: Jill Williams MRN: [id number] Date of Birth: April 25, 1971  Transition of Care [facility]) CM/SW Contact:    Lia Hopping, LCSW Phone Number: 07/26/2019, 10:46 AM  Clinical Narrative:                 Patient admitted after surgery. Her diet was advanced as tolerated. Once she was able to tolerate a solid diet and have good bowel function, she was felt to be in stable condition for discharge back to her skilled nursing facility. She was given a prescription for tramadol for pain. She can use Imodium as needed for diarrhea. 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  Patient is a [facility]. CSW met with the patient at bedside. Patient is a&o x4. Mostly independent with her ADLs. Patient reports she has been at the facility since Aug. 2020 after having a stroke. Patient can ambulate short distances, but mostly uses a wheelchair.Patient reports she is very motivated to continue therapy at the facility and is hopeful to transition out of the skilled facility to her friends home.  CSW reached out to SNF resident coordinator. She confirm the patient will need a covid test prior to returning.  The facility transported the patient with wheelchair. The facility will make arrangements to transport the patient tomorrow.  TOC staff will continue to follow this patient.   Barrier to discharge today, covid test. Covid test pending.   Expected Discharge Plan: Skilled Nursing Facility Barriers to Discharge: Other (comment)(Covid test)   Patient Goals and CMS Choice Patient states their goals for this hospitalization and ongoing recovery are:: "I don't give up, I want to be able to do for myself."   Choice offered to / list presented to : NA  Expected Discharge Plan and Services Expected Discharge Plan: [place] In-house Referral:  Clinical Social Work Discharge Planning Services: CM Consult Post Acute Care Choice: NA Living arrangements for the past 2 months: [place] Expected Discharge Date: 07/26/19                                    Prior Living Arrangements/Services Living arrangements for the past 2 months: [place] Lives with:: Facility Resident Patient language and need for interpreter reviewed:: No Do you feel safe going back to the place where you live?: Yes      Need for Family Participation in Patient Care: Yes (Comment) Care giver support system in place?: Yes (comment) Current home services: DME Criminal Activity/Legal Involvement Pertinent to Current Situation/Hospitalization: No - Comment as needed  Activities of Daily Living Home Assistive Devices/Equipment: Wheelchair ADL Screening (condition at time of admission) Patient's cognitive ability adequate to safely complete daily activities?: Yes Is the patient deaf or have difficulty hearing?: No Does the patient have difficulty seeing, even when wearing glasses/contacts?: No Does the patient have difficulty concentrating, remembering, or making decisions?: No Patient able to express need for assistance with ADLs?: Yes Does the patient have difficulty dressing or bathing?: Yes Independently performs ADLs?: Yes (appropriate for developmental age) Does the patient have difficulty walking or climbing stairs?: No Weakness of Legs: None Weakness of Arms/Hands: Left  Permission Sought/Granted   Permission granted to share information with : Yes, Verbal Permission Granted     Permission  granted to share info w AGENCY: [facility]        Emotional Assessment Appearance:: Appears stated age Attitude/Demeanor/Rapport: Unable to Assess Affect (typically observed): Accepting, Calm Orientation: : Oriented to Situation, Oriented to  Time, Oriented to Place, Oriented to Self Alcohol / Substance Use:  Not Applicable Psych Involvement: No (comment)  Admission diagnosis:  Ileostomy dysfunction ([place]) [K94.13] Patient Active Problem List   Diagnosis Date Noted  . Ileostomy dysfunction ([place]) 07/23/2019  . Rectal cancer ([place]) 12/10/2018  . Right-sided carotid artery disease ([place]) 02/15/2018  . Hematemesis with nausea 02/04/2018  . Acute encephalopathy 02/04/2018  . History of stroke with residual deficit 02/04/2018  . GI bleed 02/04/2018  . Tobacco abuse counseling   . TIA (transient ischemic attack) 01/19/2018  . Syncope 01/19/2018  . Carotid artery stenosis, asymptomatic, bilateral 01/18/2018  . Stroke (cerebrum) ([place]) 01/18/2018  . Essential hypertension   . HLD (hyperlipidemia)   . Tobacco abuse   . Stroke ([place]) 05/10/2014   PCP:  Maris Berger, MD Pharmacy:   [facility] [address], [place] [address] [place] [address] Phone: [phone] Fax: [phone]     Social Determinants of Health (SDOH) Interventions    Readmission Risk Interventions No flowsheet data found.

## 2019-07-26 NOTE — NC FL2 (Signed)
[place] LEVEL OF CARE SCREENING TOOL     IDENTIFICATION  Patient Name: Jill Williams Birthdate: 27-Jan-1971 Sex: female Admission Date (Current Location): 07/23/2019  [facility] and [place] Number:  Herbalist and Address:  [facility],  [place], [place]      Provider Number: [id number]  Attending Physician Name and Address:  Leighton Ruff, MD  Relative Name and Phone Number:     Marlane Mingle Mother [phone]       Current Level of Care: Hospital Recommended Level of Care: Advance Prior Approval Number:    Date Approved/Denied:   PASRR Number:    Discharge Plan: Home    Current Diagnoses: Patient Active Problem List   Diagnosis Date Noted  . Ileostomy dysfunction ([place]) 07/23/2019  . Rectal cancer ([place]) 12/10/2018  . Right-sided carotid artery disease ([place]) 02/15/2018  . Hematemesis with nausea 02/04/2018  . Acute encephalopathy 02/04/2018  . History of stroke with residual deficit 02/04/2018  . GI bleed 02/04/2018  . Tobacco abuse counseling   . TIA (transient ischemic attack) 01/19/2018  . Syncope 01/19/2018  . Carotid artery stenosis, asymptomatic, bilateral 01/18/2018  . Stroke (cerebrum) ([place]) 01/18/2018  . Essential hypertension   . HLD (hyperlipidemia)   . Tobacco abuse   . Stroke ([place]) 05/10/2014    Orientation RESPIRATION BLADDER Height & Weight     Self, Situation, Time, Place  Normal Continent Weight: 134 lb 7.7 oz (61 kg) Height:     BEHAVIORAL SYMPTOMS/MOOD NEUROLOGICAL BOWEL NUTRITION STATUS      Continent Diet(Regular)  AMBULATORY STATUS COMMUNICATION OF NEEDS Skin   Limited Assist Verbally (Incision Abdomen and Neck Right)                       Personal Care Assistance Level of Assistance  Bathing, Dressing, Feeding Bathing Assistance: Limited assistance Feeding assistance: Independent Dressing Assistance: Limited assistance     Functional  Limitations Info  Sight, Speech, Hearing Sight Info: Impaired(Wear Glasses) Hearing Info: Adequate Speech Info: Adequate    SPECIAL CARE FACTORS FREQUENCY                       Contractures Contractures Info: Present    Additional Factors Info  Code Status, Allergies Code Status Info: Fullcode Allergies Info: Allergies: Ivp Dye Iodinated Diagnostic Agents, Codeine, Influenza Vaccine Live, Morphine And Related, Pneumococcal Vaccines, Shrimp Shellfish Allergy           Current Medications (07/26/2019):  This is the current hospital active medication list Current Facility-Administered Medications  Medication Dose Route Frequency Provider Last Rate Last Admin  . acetaminophen (TYLENOL) tablet 1,000 mg  1,000 mg Oral [id number] Leighton Ruff, MD   [id number] mg at 07/26/19 1107  . alum & mag hydroxide-simeth (MAALOX/MYLANTA) 200-200-20 MG/5ML suspension 30 mL  30 mL Oral [id number] PRN Leighton Ruff, MD      . atorvastatin (LIPITOR) tablet 80 mg  80 mg Oral QHS Leighton Ruff, MD   80 mg at 07/25/19 2200  . diphenhydrAMINE (BENADRYL) capsule 25 mg  25 mg Oral [id number] PRN Leighton Ruff, MD       Or  . diphenhydrAMINE (BENADRYL) injection 25 mg  25 mg Intravenous [id number] PRN Leighton Ruff, MD      . enoxaparin (LOVENOX) injection 40 mg  40 mg Subcutaneous [id number] Leighton Ruff, MD   40 mg at 07/26/19 0754  . feeding supplement (ENSURE SURGERY)  liquid 237 mL  237 mL Oral BID BM Leighton Ruff, MD   [id number] mL at 07/25/19 1253  . gabapentin (NEURONTIN) capsule 300 mg  300 mg Oral BID Leighton Ruff, MD   [id number] mg at 07/26/19 0900  . HYDROmorphone (DILAUDID) injection 0.5 mg  0.5 mg Intravenous [id number] PRN Leighton Ruff, MD   0.5 mg at 07/23/19 1409  . loperamide (IMODIUM) capsule 2 mg  2 mg Oral PRN Leighton Ruff, MD   2 mg at [id number]  . ondansetron (ZOFRAN) tablet 4 mg  4 mg Oral [id number] PRN Leighton Ruff, MD       Or  . ondansetron [facility]) injection 4 mg  4 mg Intravenous [id number] PRN Leighton Ruff, MD      .  saccharomyces boulardii (FLORASTOR) capsule 250 mg  250 mg Oral BID Leighton Ruff, MD   [id number] mg at 07/26/19 0900  . traMADol (ULTRAM) tablet 50 mg  50 mg Oral [id number] PRN Leighton Ruff, MD   50 mg at 07/25/19 1253     Discharge Medications: Please see discharge summary for a list of discharge medications.  Relevant Imaging Results:  Relevant Lab Results:   Additional Information [id number]  Lia Hopping, LCSW

## 2019-07-27 NOTE — Plan of Care (Signed)
Patient is discharged back to SNF discharge instructions were given to patient and report was called to [phone]. All questions were answered.

## 2019-07-27 NOTE — TOC Transition Note (Addendum)
Transition of Care [facility]) - CM/SW Discharge Note   Patient Details  Name: Jill Williams MRN: [id number] Date of Birth: 1970/11/15  Transition of Care [facility]) CM/SW Contact:  Lia Hopping, [place] Phone Number: 07/27/2019, 10:29 AM   Clinical Narrative:    covid negative Nurse call report to: [phone] Facility to transport at 11:00am  Final next level of care: Skilled Nursing Facility Barriers to Discharge: Barriers Resolved   Patient Goals and CMS Choice Patient states their goals for this hospitalization and ongoing recovery are:: "I don't give up, I want to be able to do for myself."   Choice offered to / list presented to : NA  Discharge Placement              Patient chooses bed at: [facility] Patient to be transferred to facility by: Facility will transport.      Discharge Plan and Services In-house Referral: Clinical Social Work Discharge Planning Services: CM Consult Post Acute Care Choice: NA                               Social Determinants of Health (SDOH) Interventions     Readmission Risk Interventions No flowsheet data found.

## 2019-09-01 DIAGNOSIS — I69398 Other sequelae of cerebral infarction: Secondary | ICD-10-CM | POA: Insufficient documentation

## 2019-09-24 DIAGNOSIS — C20 Malignant neoplasm of rectum: Secondary | ICD-10-CM | POA: Diagnosis not present

## 2020-04-28 ENCOUNTER — Other Ambulatory Visit: Payer: Self-pay | Admitting: Hematology and Oncology

## 2020-04-28 DIAGNOSIS — C20 Malignant neoplasm of rectum: Secondary | ICD-10-CM

## 2020-05-10 DIAGNOSIS — F17210 Nicotine dependence, cigarettes, uncomplicated: Secondary | ICD-10-CM | POA: Insufficient documentation

## 2020-05-11 ENCOUNTER — Telehealth: Payer: Self-pay | Admitting: Oncology

## 2020-05-11 NOTE — Progress Notes (Signed)
[facility]  [address] [place],    [address] [phone]  Clinic Day:  05/12/2020  Referring physician: Maris Berger, MD   This document serves as a record of services personally performed by [name], MD. It was created on their behalf by [facility], a trained medical scribe. The creation of this record is based on the scribe's personal observations and the provider's statements to them.   CHIEF COMPLAINT:  CC: Stage IIA adenocarcinoma of the rectum  Current Treatment:  Surveillance   HISTORY OF PRESENT ILLNESS:  Jill Williams is a 49 y.o. 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  She presented with intermittent rectal bleeding as well as chronic diarrhea for at least 6 months.  Colonoscopy revealed a mass in the right posterior wall about 3 cm from the anal verge extending to 8 cm above the anal verge, encompassing more than half of the mucosal wall.  Biopsy revealed invasive adenocarcinoma.  Neoadjuvant chemoradiation was recommended, to be followed by adjuvant FOLFOX postop.  CT imaging revealed a 6 cm annual lower rectal mass with a small right perirectal node measuring 5 mm and a left perirectal node measuring 1.8 cm.  There was a 1 cm calcified right liver lesion felt to be benign.  There were scattered subcentimeter bilateral lung nodules, the largest measuring 6 mm.  PET revealed an extensive hypermetabolic rectal mass with an SUV of 12.5.  The left pelvic sidewall lymph node had decreased to 6.5 mm and was not hypermetabolic.  The right 5 mm perirectal lymph node was not hypermetabolic.  The lung nodules were not hypermetabolic, but continued follow-up was recommended.  She received neoadjuvant chemoradiation with infusional 5 fluorouracil week 1 and week 5 and started treatment on March 16th.  She had severe chronic diarrhea, but continued daily radiation.   She  has a history of a stroke in 2015 which has left her with left upper extremity paraplegia and left lower extremity weakness and foot drop.  She requires a brace for her left ankle.  She is status post right endarterectomy.  She also has hypertension, hyperlipidemia, peripheral vascular disease, osteoarthritis, gastroesophageal reflux disease, and COPD.  Chest x-ray done December 30th clearly showed hyperinflation.  Unfortunately, she continues to smoke.  She has not had further menstrual periods since her stroke. She will eventually require a left carotid endarterectomy.  Due to her having rectal cancer at age 59, she met criteria for testing for hereditary colorectal cancer, but she initially declined.  Review of her family history reveals her father had lung cancer at an unknown age, a maternal aunt had breast cancer at age 77, her maternal grandmother had an unknown cancer at an unknown age and a paternal aunt had breast cancer at an unknown age.  Myriad myRisk Hereditary Cancer Panel test did not reveal any clinically significant mutation or variant of uncertain significance.  Her lifetime breast cancer risk was estimated at 7.8%.  She underwent surgical resection in July 2020 and was found to have an excellent response to neoadjuvant chemoradiation.  Pathology revealed scant residual foci of adenocarcinoma with the largest foci measuring 0.4 cm.  Eleven lymph nodes were negative for metastasis.  She has had a lot of difficulty with the colostomy bags and ended up in a rehab facility during her postop chemotherapy.  She was recommended for adjuvant FOLFOX chemotherapy.  Her son died in March 17, 2023 in  a shooting accident and she stopped eating and drinking for 2 weeks.  She was then admitted to the hospital with a sodium of 166 and was in for several days of IV fluids.  We felt this was from dehydration and malnutrition.  MRI brain, did not reveal acute findings, the massive prior stroke with encephalomalacia was  seen.  She had been deteriorating with weight loss, anorexia and depression.  She finally agreed to take trazodone 75 mg daily.  She was severely weak.  We discontinued chemotherapy after 5 cycles due to her performance status.  However, her long term prognosis is still good.  She is on  B12 supplementation.  She underwent colostomy reversal on January 21st at [facility] with Dr. Leighton Ruff.     INTERVAL HISTORY:  Jill Williams is here for routine follow up and states that she had her port removed on November 30th.  She has had multiple episodes of suicidal thoughts and has ended up in the emergency department three times, once requiring hospitalization.  She has been homeless until recently.  She states that she is now in a recovery house and doing much better and has the help she needs.  She is scheduled for colonoscopy with Dr. Lyda Jester in the next couple of months.  She is also being schedule for mammogram through Dr. Selena Batten. Blood counts and chemistries are unremarkable.  She continues oral B12 supplement and Boost.  Her  appetite is good, and she has gained 10 pounds since her last visit.  She denies fever, chills or other signs of infection.  She denies nausea, vomiting, bowel issues, or abdominal pain.  She denies sore throat, cough, dyspnea, or chest pain.   REVIEW OF SYSTEMS:  Review of Systems  Constitutional: Negative.   HENT:  Negative.   Eyes: Negative.   Respiratory: Negative.   Cardiovascular: Negative.   Gastrointestinal: Negative.   Endocrine: Negative.   Genitourinary: Negative.    Musculoskeletal: Positive for gait problem (persistent but stable).  Skin: Negative.   Neurological: Positive for gait problem (persistent but stable).  Hematological: Negative.   Psychiatric/Behavioral: Negative.      VITALS:  Blood pressure (!) 159/71, pulse 83, temperature 98.5 F (36.9 C), temperature source Oral, resp. rate 18, height 5' 7"  (1.702 m), weight 115 lb 4.8 oz (52.3 kg), last  menstrual period 07/18/2014, SpO2 93 %.  Wt Readings from Last 3 Encounters:  05/12/20 115 lb 4.8 oz (52.3 kg)  07/24/19 134 lb 7.7 oz (61 kg)  07/20/19 124 lb 4 oz (56.4 kg)    Body mass index is 18.06 kg/m.  Performance status (ECOG): 1 - Symptomatic but completely ambulatory  PHYSICAL EXAM:  Physical Exam Constitutional:      General: She is not in acute distress.    Appearance: Normal appearance. She is normal weight.  HENT:     Head: Normocephalic and atraumatic.  Eyes:     General: No scleral icterus.    Extraocular Movements: Extraocular movements intact.     Conjunctiva/sclera: Conjunctivae normal.     Pupils: Pupils are equal, round, and reactive to light.  Cardiovascular:     Rate and Rhythm: Normal rate and regular rhythm.     Pulses: Normal pulses.     Heart sounds: Normal heart sounds. No murmur heard.  No friction rub. No gallop.   Pulmonary:     Effort: Pulmonary effort is normal. No respiratory distress.     Breath sounds: Normal breath sounds.  Abdominal:  General: Bowel sounds are normal. There is no distension.     Palpations: Abdomen is soft. There is no mass.     Tenderness: There is no abdominal tenderness.     Comments: Ostomy scar in the right mid abdomen which is well healed.  Musculoskeletal:        General: Normal range of motion.     Cervical back: Normal range of motion and neck supple.     Right lower leg: No edema.     Left lower leg: No edema.  Lymphadenopathy:     Cervical: No cervical adenopathy.  Skin:    General: Skin is warm and dry.  Neurological:     General: No focal deficit present.     Mental Status: She is alert and oriented to person, place, and time. Mental status is at baseline.     Gait: Gait abnormal.     Comments: She has persistent dense hemiparesis and foot drop.   Psychiatric:        Mood and Affect: Mood normal.        Behavior: Behavior normal.        Thought Content: Thought content normal.         Judgment: Judgment normal.     LABS:   CBC Latest Ref Rng & Units 07/26/2019 07/25/2019 07/24/2019  WBC 4.0 - 10.5 K/uL 4.7 5.4 8.6  Hemoglobin 12.0 - 15.0 g/dL 10.6(L) 10.2(L) 11.5(L)  Hematocrit 36 - 46 % 33.9(L) 33.2(L) 34.8(L)  Platelets 150 - 400 K/uL 182 185 203   CMP Latest Ref Rng & Units 07/26/2019 07/25/2019 07/24/2019  Glucose 70 - 99 mg/dL 84 84 128(H)  BUN 6 - 20 mg/dL 14 14 16   Creatinine 0.44 - 1.00 mg/dL 0.58 0.60 0.69  Sodium 135 - 145 mmol/L 143 145 142  Potassium 3.5 - 5.1 mmol/L 3.5 3.8 3.8  Chloride 98 - 111 mmol/L 110 110 107  CO2 22 - 32 mmol/L 26 27 27   Calcium 8.9 - 10.3 mg/dL 9.1 9.2 9.4  Total Protein 6.5 - 8.1 g/dL - - -  Total Bilirubin 0.3 - 1.2 mg/dL - - -  Alkaline Phos 38 - 126 U/L - - -  AST 15 - 41 U/L - - -  ALT 0 - 44 U/L - - -     No results found for: CEA1 / No results found for: CEA1   STUDIES:  No results found.   Allergies:  Allergies  Allergen Reactions  . Iodine-131 Anaphylaxis  . Ivp Dye [Iodinated Diagnostic Agents] Shortness Of Breath and Rash  . Other Rash, Shortness Of Breath and Nausea And Vomiting    Other reaction(s): Respiratory Distress (ALLERGY/intolerance)  . Morphine Itching  . Codeine Nausea And Vomiting and Itching  . Influenza Vaccine Live Rash  . Morphine And Related Rash  . Pneumococcal Vaccines Rash  . Shellfish Allergy Nausea And Vomiting    Current Medications: Current Outpatient Medications  Medication Sig Dispense Refill  . ramipril (ALTACE) 2.5 MG capsule Take by mouth.    Marland Kitchen acetaminophen (TYLENOL) 500 MG tablet Take 1,000 mg by mouth every 4 (four) hours as needed for moderate pain.     Marland Kitchen ascorbic acid (RA VITAMIN C) 500 MG tablet Take 500 mg by mouth 2 (two) times daily. (0900 & 2100)    . aspirin 81 MG chewable tablet Chew 81 mg by mouth daily at 6 PM. (1700)    . atorvastatin (LIPITOR) 80 MG tablet Take 1 tablet (80  mg total) by mouth daily at 6 PM. (Patient taking differently: Take 80 mg by  mouth at bedtime. (2000)) 30 tablet 0  . B Complex-C (B-COMPLEX WITH VITAMIN C) tablet Take 1 tablet by mouth daily.    . cholecalciferol (VITAMIN D) 25 MCG (1000 UNIT) tablet Take 2,000 Units by mouth daily.    Marland Kitchen loperamide (IMODIUM) 2 MG capsule Take 1 capsule (2 mg total) by mouth as needed for diarrhea or loose stools. 120 capsule 0  . ondansetron (ZOFRAN) 4 MG tablet Take 4 mg by mouth every 6 (six) hours as needed for nausea or vomiting.    . prochlorperazine (COMPAZINE) 10 MG tablet Take 10 mg by mouth every 6 (six) hours as needed for nausea or vomiting.    . sertraline (ZOLOFT) 50 MG tablet Take by mouth.    . vitamin B-12 (CYANOCOBALAMIN) 50 MCG tablet Take 50 mcg by mouth daily at 10 pm. (2100)    . Zinc 50 MG TABS Take 50 mg by mouth daily.     No current facility-administered medications for this visit.     ASSESSMENT & PLAN:   Assessment:   1. Clinical stage IIA rectal cancer treated with neoadjuvant chemoradiation with excellent response.  She received adjuvant FOLFOX chemotherapy for 5 cycles.  We feel she still has a good prognosis.   2. History of tobacco abuse, she stopped smoking in June 2021.  3. History of large stroke with persistent left hemiparesis.  She will likely need to have carotid endarterectomy on the left side also eventually.   4. Colorectal cancer at early age.  Myriad myRisk Hereditary Cancer Panel did not reveal any clinically significant mutation or variant of uncertain significance.  Plan: She is now at a recovery home, and doing much better.  She continues to focus on gaining weight.  She had her port removed on November 30th.  She is scheduled for colonoscopy with Dr. Lyda Jester in the next couple of months.  She is also being schedule for mammogram through Dr. Selena Batten.  We will see her back in 3 months with CBC, CMP, and CEA.  She verbalizes understanding of an agreement to the plan today.  She knows to call the office should any new questions or  concerns arise.   I provided 15 minutes of face-to-face time during this this encounter and > 50% was spent counseling as documented under my assessment and plan.   ADDENDUM:  Her B12 level is quite low at 156 despite oral supplement, I will recommend B12 injections.  Derwood Kaplan, MD [facility] [address] [place] [address] Dept: [phone] Dept Fax: [phone]   I, Rita Ohara, am acting as scribe for Derwood Kaplan, MD  I have reviewed this report as typed by the medical scribe, and it is complete and accurate.

## 2020-05-11 NOTE — Telephone Encounter (Signed)
Patient called to Rescheduled 10/12 Labs, Follow Up.  Rescheduled to 12/3 Labs (Overbooked) and Follow Up

## 2020-05-12 ENCOUNTER — Other Ambulatory Visit: Payer: Self-pay | Admitting: Oncology

## 2020-05-12 ENCOUNTER — Inpatient Hospital Stay: Payer: Medicaid Other

## 2020-05-12 ENCOUNTER — Telehealth: Payer: Self-pay | Admitting: Oncology

## 2020-05-12 ENCOUNTER — Inpatient Hospital Stay: Payer: Medicaid Other | Attending: Oncology | Admitting: Oncology

## 2020-05-12 ENCOUNTER — Other Ambulatory Visit: Payer: Self-pay | Admitting: Hematology and Oncology

## 2020-05-12 ENCOUNTER — Encounter: Payer: Self-pay | Admitting: Oncology

## 2020-05-12 VITALS — BP 159/71 | HR 83 | Temp 98.5°F | Resp 18 | Ht 67.0 in | Wt 115.3 lb

## 2020-05-12 DIAGNOSIS — I69354 Hemiplegia and hemiparesis following cerebral infarction affecting left non-dominant side: Secondary | ICD-10-CM

## 2020-05-12 DIAGNOSIS — E538 Deficiency of other specified B group vitamins: Secondary | ICD-10-CM

## 2020-05-12 DIAGNOSIS — I739 Peripheral vascular disease, unspecified: Secondary | ICD-10-CM | POA: Diagnosis not present

## 2020-05-12 DIAGNOSIS — Z809 Family history of malignant neoplasm, unspecified: Secondary | ICD-10-CM

## 2020-05-12 DIAGNOSIS — F17210 Nicotine dependence, cigarettes, uncomplicated: Secondary | ICD-10-CM

## 2020-05-12 DIAGNOSIS — C20 Malignant neoplasm of rectum: Secondary | ICD-10-CM

## 2020-05-12 DIAGNOSIS — J449 Chronic obstructive pulmonary disease, unspecified: Secondary | ICD-10-CM

## 2020-05-12 DIAGNOSIS — I10 Essential (primary) hypertension: Secondary | ICD-10-CM

## 2020-05-12 DIAGNOSIS — Z803 Family history of malignant neoplasm of breast: Secondary | ICD-10-CM

## 2020-05-12 DIAGNOSIS — K219 Gastro-esophageal reflux disease without esophagitis: Secondary | ICD-10-CM

## 2020-05-12 DIAGNOSIS — Z801 Family history of malignant neoplasm of trachea, bronchus and lung: Secondary | ICD-10-CM

## 2020-05-12 DIAGNOSIS — Z933 Colostomy status: Secondary | ICD-10-CM

## 2020-05-12 DIAGNOSIS — Z8673 Personal history of transient ischemic attack (TIA), and cerebral infarction without residual deficits: Secondary | ICD-10-CM

## 2020-05-12 LAB — CBC AND DIFFERENTIAL
HCT: 40 (ref 36–46)
Hemoglobin: 13.8 (ref 12.0–16.0)
Neutrophils Absolute: 4.86
Platelets: 206 (ref 150–399)
WBC: 6.4

## 2020-05-12 LAB — BASIC METABOLIC PANEL
BUN: 15 (ref 4–21)
CO2: 27 — AB (ref 13–22)
Chloride: 105 (ref 99–108)
Creatinine: 0.8 (ref 0.5–1.1)
Glucose: 112
Potassium: 3.8 (ref 3.4–5.3)
Sodium: 141 (ref 137–147)

## 2020-05-12 LAB — CBC: RBC: 4.42 (ref 3.87–5.11)

## 2020-05-12 LAB — HEPATIC FUNCTION PANEL
ALT: 19 (ref 7–35)
AST: 27 (ref 13–35)
Alkaline Phosphatase: 100 (ref 25–125)
Bilirubin, Total: 0.5

## 2020-05-12 LAB — VITAMIN B12: Vitamin B-12: 156 pg/mL — ABNORMAL LOW (ref 180–914)

## 2020-05-12 LAB — COMPREHENSIVE METABOLIC PANEL
Albumin: 4.3 (ref 3.5–5.0)
Calcium: 9.7 (ref 8.7–10.7)

## 2020-05-12 NOTE — Telephone Encounter (Signed)
Per 12/3 LOS, patient scheduled for Mar 2022 Appt's.  Gave patient Appt Summary

## 2020-05-14 ENCOUNTER — Encounter: Payer: Self-pay | Admitting: Oncology

## 2020-05-14 DIAGNOSIS — E538 Deficiency of other specified B group vitamins: Secondary | ICD-10-CM | POA: Insufficient documentation

## 2020-05-15 ENCOUNTER — Other Ambulatory Visit: Payer: Self-pay | Admitting: Oncology

## 2020-05-15 ENCOUNTER — Telehealth: Payer: Self-pay

## 2020-05-15 NOTE — Telephone Encounter (Signed)
-----   Message from Derwood Kaplan, MD sent at 05/14/2020  4:39 PM EST ----- Regarding: call pt Tell her B12 level low even though she is taking pills.  I recommend B12 injections, usually weekly x 4 weeks, then monthly.  Is she okay with [place] setting that up?

## 2020-05-15 NOTE — Telephone Encounter (Signed)
Patient states she is ok with B12 injections. She is ok with [place] setting this up for her. She said to call her with the appointment date and time.

## 2020-05-19 ENCOUNTER — Telehealth: Payer: Self-pay | Admitting: Oncology

## 2020-05-19 ENCOUNTER — Encounter: Payer: Self-pay | Admitting: Oncology

## 2020-05-19 NOTE — Telephone Encounter (Signed)
Patient called to get her next B12 Injections.  Patient beginning 12/13, 12/20, 12/27 06/12/20 then once a month.  Scheduled up to June.  Mailed Appt Summary

## 2020-05-22 ENCOUNTER — Inpatient Hospital Stay: Payer: Medicaid Other

## 2020-05-22 NOTE — Addendum Note (Signed)
Addended by: Juanetta Beets on: 05/22/2020 04:13 PM   Modules accepted: Orders

## 2020-05-23 ENCOUNTER — Inpatient Hospital Stay: Payer: Medicaid Other

## 2020-05-23 ENCOUNTER — Other Ambulatory Visit: Payer: Self-pay

## 2020-05-23 VITALS — BP 131/68 | HR 93 | Temp 98.4°F | Resp 18 | Ht 67.0 in | Wt 116.5 lb

## 2020-05-23 DIAGNOSIS — E538 Deficiency of other specified B group vitamins: Secondary | ICD-10-CM | POA: Diagnosis not present

## 2020-05-23 MED ORDER — CYANOCOBALAMIN 1000 MCG/ML IJ SOLN
1000.0000 ug | Freq: Once | INTRAMUSCULAR | Status: AC
  Administered 2020-05-23: 1000 ug via INTRAMUSCULAR

## 2020-05-23 MED ORDER — CYANOCOBALAMIN 1000 MCG/ML IJ SOLN
INTRAMUSCULAR | Status: AC
  Filled 2020-05-23: qty 1

## 2020-05-23 NOTE — Progress Notes (Signed)
PT STABLE AT TIME OF DISCHARGE 

## 2020-05-23 NOTE — Patient Instructions (Signed)

## 2020-05-25 NOTE — Progress Notes (Signed)
PT STABLE AT TIME OF DISCHARGE 

## 2020-05-29 ENCOUNTER — Inpatient Hospital Stay: Payer: Medicaid Other

## 2020-05-29 ENCOUNTER — Telehealth: Payer: Self-pay

## 2020-05-29 NOTE — Telephone Encounter (Signed)
Patient missed her B12 shot appointments for today. Unable to leave a message.

## 2020-05-31 ENCOUNTER — Telehealth: Payer: Self-pay | Admitting: Hematology and Oncology

## 2020-05-31 NOTE — Telephone Encounter (Signed)
Patient called to rescheduled 12/20 Missed B12 Injection.  Rescheduled to 12/23 - OK per Vida Roller

## 2020-06-01 ENCOUNTER — Inpatient Hospital Stay: Payer: Medicaid Other

## 2020-06-01 ENCOUNTER — Other Ambulatory Visit: Payer: Self-pay

## 2020-06-01 VITALS — BP 146/67 | HR 89 | Temp 98.6°F | Resp 18 | Ht 67.0 in | Wt 115.5 lb

## 2020-06-01 DIAGNOSIS — E538 Deficiency of other specified B group vitamins: Secondary | ICD-10-CM

## 2020-06-01 MED ORDER — CYANOCOBALAMIN 1000 MCG/ML IJ SOLN
1000.0000 ug | Freq: Once | INTRAMUSCULAR | Status: AC
  Administered 2020-06-01: 1000 ug via INTRAMUSCULAR

## 2020-06-01 MED ORDER — CYANOCOBALAMIN 1000 MCG/ML IJ SOLN
INTRAMUSCULAR | Status: AC
  Filled 2020-06-01: qty 1

## 2020-06-01 NOTE — Progress Notes (Signed)
PT STABLE AT TIME OF DISCHARGE 

## 2020-06-01 NOTE — Patient Instructions (Signed)

## 2020-06-05 ENCOUNTER — Other Ambulatory Visit: Payer: Self-pay

## 2020-06-05 ENCOUNTER — Inpatient Hospital Stay: Payer: Medicaid Other

## 2020-06-05 VITALS — BP 144/66 | HR 88 | Temp 97.8°F | Resp 18 | Ht 67.0 in | Wt 114.2 lb

## 2020-06-05 DIAGNOSIS — E538 Deficiency of other specified B group vitamins: Secondary | ICD-10-CM

## 2020-06-05 MED ORDER — CYANOCOBALAMIN 1000 MCG/ML IJ SOLN: 1000.0000 ug | Freq: Once | INTRAMUSCULAR | Status: DC

## 2020-06-05 MED ORDER — CYANOCOBALAMIN 1000 MCG/ML IJ SOLN
INTRAMUSCULAR | Status: AC
  Filled 2020-06-05: qty 1

## 2020-06-05 NOTE — Progress Notes (Signed)
PT STABLE AT TIME OF DISCHARGE 

## 2020-06-05 NOTE — Patient Instructions (Signed)
Vitamin B12 Deficiency Vitamin B12 deficiency occurs when the body does not have enough vitamin B12, which is an important vitamin. The body needs this vitamin:  To make red blood cells.  To make DNA. This is the genetic material inside cells.  To help the nerves work properly so they can carry messages from the brain to the body. Vitamin B12 deficiency can cause various health problems, such as a low red blood cell count (anemia) or nerve damage. What are the causes? This condition may be caused by:  Not eating enough foods that contain vitamin B12.  Not having enough stomach acid and digestive fluids to properly absorb vitamin B12 from the food that you eat.  Certain digestive system diseases that make it hard to absorb vitamin B12. These diseases include Crohn's disease, chronic pancreatitis, and cystic fibrosis.  A condition in which the body does not make enough of a protein (intrinsic factor), resulting in too few red blood cells (pernicious anemia).  Having a surgery in which part of the stomach or small intestine is removed.  Taking certain medicines that make it hard for the body to absorb vitamin B12. These medicines include: ? Heartburn medicines (antacids and proton pump inhibitors). ? Certain antibiotic medicines. ? Some medicines that are used to treat diabetes, tuberculosis, gout, or high cholesterol. What increases the risk? The following factors may make you more likely to develop a B12 deficiency:  Being older than age 50.  Eating a vegetarian or vegan diet, especially while you are pregnant.  Eating a poor diet while you are pregnant.  Taking certain medicines.  Having alcoholism. What are the signs or symptoms? In some cases, there are no symptoms of this condition. If the condition leads to anemia or nerve damage, various symptoms can occur, such as:  Weakness.  Fatigue.  Loss of appetite.  Weight loss.  Numbness or tingling in your hands and  feet.  Redness and burning of the tongue.  Confusion or memory problems.  Depression.  Sensory problems, such as color blindness, ringing in the ears, or loss of taste.  Diarrhea or constipation.  Trouble walking. If anemia is severe, symptoms can include:  Shortness of breath.  Dizziness.  Rapid heart rate (tachycardia). How is this diagnosed? This condition may be diagnosed with a blood test to measure the level of vitamin B12 in your blood. You may also have other tests, including:  A group of tests that measure certain characteristics of blood cells (complete blood count, CBC).  A blood test to measure intrinsic factor.  A procedure where a thin tube with a camera on the end is used to look into your stomach or intestines (endoscopy). Other tests may be needed to discover the cause of B12 deficiency. How is this treated? Treatment for this condition depends on the cause. This condition may be treated by:  Changing your eating and drinking habits, such as: ? Eating more foods that contain vitamin B12. ? Drinking less alcohol or no alcohol.  Getting vitamin B12 injections.  Taking vitamin B12 supplements. Your health care provider will tell you which dosage is best for you. Follow these instructions at home: Eating and drinking   Eat lots of healthy foods that contain vitamin B12, including: ? Meats and poultry. This includes beef, pork, chicken, turkey, and organ meats, such as liver. ? Seafood. This includes clams, rainbow trout, salmon, tuna, and haddock. ? Eggs. ? Cereal and dairy products that are fortified. This means that vitamin B12   has been added to the food. Check the label on the package to see if the food is fortified. The items listed above may not be a complete list of recommended foods and beverages. Contact a dietitian for more information. General instructions  Get any injections that are prescribed by your health care provider.  Take  supplements only as told by your health care provider. Follow the directions carefully.  Do not drink alcohol if your health care provider tells you not to. In some cases, you may only be asked to limit alcohol use.  Keep all follow-up visits as told by your health care provider. This is important. Contact a health care provider if:  Your symptoms come back. Get help right away if you:  Develop shortness of breath.  Have a rapid heart rate.  Have chest pain.  Become dizzy or lose consciousness. Summary  Vitamin B12 deficiency occurs when the body does not have enough vitamin B12.  The main causes of vitamin B12 deficiency include dietary deficiency, digestive diseases, pernicious anemia, and having a surgery in which part of the stomach or small intestine is removed.  In some cases, there are no symptoms of this condition. If the condition leads to anemia or nerve damage, various symptoms can occur, such as weakness, shortness of breath, and numbness.  Treatment may include getting vitamin B12 injections or taking vitamin B12 supplements. Eat lots of healthy foods that contain vitamin B12. This information is not intended to replace advice given to you by your health care provider. Make sure you discuss any questions you have with your health care provider. Document Revised: 11/13/2018 Document Reviewed: 02/03/2018 Elsevier Patient Education  2020 [employer]  

## 2020-06-12 ENCOUNTER — Telehealth: Payer: Self-pay

## 2020-06-12 ENCOUNTER — Inpatient Hospital Stay: Payer: Medicaid Other | Attending: Oncology

## 2020-06-13 ENCOUNTER — Telehealth: Payer: Self-pay | Admitting: Oncology

## 2020-06-29 ENCOUNTER — Other Ambulatory Visit: Payer: Self-pay | Admitting: Pharmacist

## 2020-07-10 ENCOUNTER — Inpatient Hospital Stay: Payer: Medicaid Other

## 2020-08-07 ENCOUNTER — Inpatient Hospital Stay: Payer: Medicaid Other

## 2020-08-07 ENCOUNTER — Other Ambulatory Visit: Payer: Self-pay | Admitting: Pharmacist

## 2020-08-10 ENCOUNTER — Inpatient Hospital Stay: Payer: Medicaid Other

## 2020-08-10 ENCOUNTER — Inpatient Hospital Stay: Payer: Medicaid Other | Admitting: Oncology

## 2020-08-15 ENCOUNTER — Telehealth: Payer: Self-pay | Admitting: Oncology

## 2020-08-15 ENCOUNTER — Inpatient Hospital Stay: Payer: Medicaid Other

## 2020-08-15 NOTE — Telephone Encounter (Signed)
Patient called to let us know she cannot make Appt today for B12 Injection.  She can make tomorrow's Appt

## 2020-08-17 ENCOUNTER — Inpatient Hospital Stay: Payer: Medicaid Other | Admitting: Hematology and Oncology

## 2020-08-17 ENCOUNTER — Inpatient Hospital Stay: Payer: Medicaid Other | Attending: Oncology

## 2020-08-17 NOTE — Progress Notes (Deleted)
[name]  [address] [place],  [place]  [address] [phone]  Clinic Day:  08/17/2020  Referring physician: Maris Berger, MD   CHIEF COMPLAINT:  CC: ***  Current Treatment:  ***   HISTORY OF PRESENT ILLNESS:  Jill Williams is a 50 y.o. 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  She presented with intermittent rectal bleeding as well as chronic diarrhea for at least 6 months.  Colonoscopy revealed a mass in the right posterior wall about 3 cm from the anal verge extending to 8 cm above the anal verge, encompassing more than half of the mucosal wall.  Biopsy revealed invasive adenocarcinoma.  Neoadjuvant chemoradiation was recommended, to be followed by adjuvant FOLFOX postop.  CT imaging revealed a 6 cm annual lower rectal mass with a small right perirectal node measuring 5 mm and a left perirectal node measuring 1.8 cm.  There was a 1 cm calcified right liver lesion felt to be benign.  There were scattered subcentimeter bilateral lung nodules, the largest measuring 6 mm.  PET revealed an extensive hypermetabolic rectal mass with an SUV of 12.5.  The left pelvic sidewall lymph node had decreased to 6.5 mm and was not hypermetabolic.  The right 5 mm perirectal lymph node was not hypermetabolic.  The lung nodules were not hypermetabolic, but continued follow-up was recommended.  She received neoadjuvant chemoradiation with infusional 5 fluorouracil week 1 and week 5 and started treatment on March 16th.  She had severe chronic diarrhea, but continued daily radiation.   She has a history of a stroke in 2015 which has left her with left upper extremity paraplegia and left lower extremity weakness and foot drop.  She requires a brace for her left ankle.  She is status post right endarterectomy.  She also has hypertension, hyperlipidemia, peripheral vascular disease,  osteoarthritis, gastroesophageal reflux disease, and COPD.  Chest x-ray done December 30th clearly showed hyperinflation.  Unfortunately, she continues to smoke.  She has not had further menstrual periods since her stroke. She will eventually require a left carotid endarterectomy.  Due to her having rectal cancer at age 10, she met criteria for testing for hereditary colorectal cancer, but she initially declined.  Review of her family history reveals her father had lung cancer at an unknown age, a maternal aunt had breast cancer at age 53, her maternal grandmother had an unknown cancer at an unknown age and a paternal aunt had breast cancer at an unknown age.  Myriad myRisk Hereditary Cancer Panel test did not reveal any clinically significant mutation or variant of uncertain significance.  Her lifetime breast cancer risk was estimated at 7.8%.  She underwent surgical resection in July 2020 and was found to have an excellent response to neoadjuvant chemoradiation.  Pathology revealed scant residual foci of adenocarcinoma with the largest foci measuring 0.4 cm.  Eleven lymph nodes were negative for metastasis.  She has had a lot of difficulty with the colostomy bags and ended up in a rehab facility during her postop chemotherapy.  She was recommended for adjuvant FOLFOX chemotherapy.  Her son died in Mar 08, 2023 in a shooting accident and she stopped eating and drinking for 2 weeks.  She was then admitted to the hospital with a sodium of 166 and was in for several days of IV fluids.  We felt this was from dehydration and malnutrition.  MRI brain, did  not reveal acute findings, the massive prior stroke with encephalomalacia was seen.  She had been deteriorating with weight loss, anorexia and depression.  She finally agreed to take trazodone 75 mg daily.  She was severely weak.  We discontinued chemotherapy after 5 cycles due to her performance status.  However, her long term prognosis was still felt to be good.  She  is on oral B12 supplementation.  She underwent colostomy reversal in January at [facility] with Dr. Leighton Ruff.   At her visit in December, she reported multiple episodes of suicidal thoughts for which she was in the emergency department three times, once requiring hospitalization.  She had been homeless at the time, but was living in a recovery house and doing much better when we saw her.  ***She was being schedule for mammogram through Dr. Selena Batten.  She is due for colonoscopy with Dr. Lyda Jester in the next couple of months.  INTERVAL HISTORY:  Jill Williams is here today for repeat clinical assessment. She denies fevers or chills. She denies pain. Her appetite is good. Her weight {Weight change:10426}.  REVIEW OF SYSTEMS:  Review of Systems - Oncology   VITALS:  Last menstrual period 07/18/2014.  Wt Readings from Last 3 Encounters:  06/05/20 114 lb 4 oz (51.8 kg)  06/01/20 115 lb 8 oz (52.4 kg)  12/02/19 105 lb 7 oz (47.8 kg)    There is no height or weight on file to calculate BMI.  Performance status (ECOG): {CHL ONC [id number]  PHYSICAL EXAM:  Physical Exam  LABS:   CBC Latest Ref Rng & Units 05/12/2020 07/26/2019 07/25/2019  WBC - 6.4 4.7 5.4  Hemoglobin 12.0 - 16.0 13.8 10.6(L) 10.2(L)  Hematocrit 36 - 46 40 33.9(L) 33.2(L)  Platelets 150 - 399 206 182 185   CMP Latest Ref Rng & Units 05/12/2020 07/26/2019 07/25/2019  Glucose 70 - 99 mg/dL - 84 84  BUN 4 - 21 15 14 14   Creatinine 0.5 - 1.1 0.8 0.58 0.60  Sodium 137 - 147 141 143 145  Potassium 3.4 - 5.3 3.8 3.5 3.8  Chloride 99 - 108 105 110 110  CO2 13 - 22 27(A) 26 27  Calcium 8.7 - 10.7 9.7 9.1 9.2  Total Protein 6.5 - 8.1 g/dL - - -  Total Bilirubin 0.3 - 1.2 mg/dL - - -  Alkaline Phos 25 - 125 100 - -  AST 13 - 35 27 - -  ALT 7 - 35 19 - -     No results found for: CEA1 / No results found for: CEA1 No results found for: PSA1 No results found for: MOQ947 No results found for: MLY650  No results found for:  TOTALPROTELP, ALBUMINELP, A1GS, A2GS, BETS, BETA2SER, GAMS, MSPIKE, SPEI No results found for: TIBC, FERRITIN, IRONPCTSAT No results found for: LDH  STUDIES:  No results found.    HISTORY:   Past Medical History:  Diagnosis Date  . Disorder of gallbladder 2000   pt states gallbladder was removed  . Essential hypertension   . HLD (hyperlipidemia)   . S/P partial hysterectomy 2004   "whole right side removed"  . Stroke ([place]) 05/10/2014   left arm is still paralyzed , uses wheelchair   . Tobacco abuse   . Tubal ligation status 2000   "tied and bunt"    Past Surgical History:  Procedure Laterality Date  . ABDOMINAL HYSTERECTOMY     patient states she has ONLY had her right ovary and fallopian tube removed   .  CHOLECYSTECTOMY    . COLONOSCOPY  07/2018  . ENDARTERECTOMY Right 02/17/2018   Procedure: Right Carotid  ENDARTERECTOMY;  Surgeon: Waynetta Sandy, MD;  Location: [facility];  Service: Vascular;  Laterality: Right;  . ILEOSTOMY CLOSURE N/A 07/23/2019   Procedure: ILEOSTOMY REVERSAL;  Surgeon: Leighton Ruff, MD;  Location: [facility];  Service: General;  Laterality: N/A;  . XI ROBOTIC ASSISTED LOWER ANTERIOR RESECTION N/A 12/10/2018   Procedure: XI ROBOTIC ASSISTED LOWER ANTERIOR RESECTION WITH LOOP ILEOSTOMY;  Surgeon: Leighton Ruff, MD;  Location: [facility];  Service: General;  Laterality: N/A;    Family History  Problem Relation Age of Onset  . Hypertension Mother   . Lung cancer Father     Social History:  reports that she quit smoking about 21 months ago. Her smoking use included cigarettes. She has a 34.00 pack-year smoking history. She has never used smokeless tobacco. She reports that she does not drink alcohol and does not use drugs.The patient is {Blank single:19197::"alone","accompanied by"} *** today.  Allergies:  Allergies  Allergen Reactions  . Iodine-131 Anaphylaxis  . Ivp Dye [Iodinated Diagnostic Agents] Shortness Of Breath and Rash  . Other Rash,  Shortness Of Breath and Nausea And Vomiting    Other reaction(s): Respiratory Distress (ALLERGY/intolerance)  . Morphine Itching  . Codeine Nausea And Vomiting and Itching  . Influenza Vaccine Live Rash  . Morphine And Related Rash  . Pneumococcal Vaccines Rash  . Shellfish Allergy Nausea And Vomiting    Current Medications: Current Outpatient Medications  Medication Sig Dispense Refill  . acetaminophen (TYLENOL) 500 MG tablet Take 1,000 mg by mouth every 4 (four) hours as needed for moderate pain.     Marland Kitchen ascorbic acid (RA VITAMIN C) 500 MG tablet Take 500 mg by mouth 2 (two) times daily. (0900 & 2100)    . aspirin 81 MG chewable tablet Chew 81 mg by mouth daily at 6 PM. (1700)    . atorvastatin (LIPITOR) 80 MG tablet Take 1 tablet (80 mg total) by mouth daily at 6 PM. (Patient taking differently: Take 80 mg by mouth at bedtime. (2000)) 30 tablet 0  . B Complex-C (B-COMPLEX WITH VITAMIN C) tablet Take 1 tablet by mouth daily.    . cholecalciferol (VITAMIN D) 25 MCG (1000 UNIT) tablet Take 2,000 Units by mouth daily.    Marland Kitchen loperamide (IMODIUM) 2 MG capsule Take 1 capsule (2 mg total) by mouth as needed for diarrhea or loose stools. 120 capsule 0  . ondansetron (ZOFRAN) 4 MG tablet Take 4 mg by mouth every 6 (six) hours as needed for nausea or vomiting.    . prochlorperazine (COMPAZINE) 10 MG tablet Take 10 mg by mouth every 6 (six) hours as needed for nausea or vomiting.    . ramipril (ALTACE) 2.5 MG capsule Take by mouth.    . sertraline (ZOLOFT) 50 MG tablet Take by mouth.    . vitamin B-12 (CYANOCOBALAMIN) 50 MCG tablet Take 50 mcg by mouth daily at 10 pm. (2100)    . Zinc 50 MG TABS Take 50 mg by mouth daily.     No current facility-administered medications for this visit.     ASSESSMENT & PLAN:   Assessment:  Lindell Tussey is a 50 y.o. female ***  1. Clinical stage IIA rectal cancer treated with neoadjuvant chemoradiation with excellent response.  She received adjuvant  FOLFOX chemotherapy for 5 cycles.  We feel she still has a good prognosis.   2. History of  tobacco abuse, she stopped smoking in June 2021.  3. History of large stroke with persistent left hemiparesis.  She will likely need to have carotid endarterectomy on the left side also eventually.   4. Colorectal cancer at early age.  Myriad myRisk Hereditary Cancer Panel did not reveal any clinically significant mutation or variant of uncertain significance.  Plan: ***  The patient understands the plans discussed today and is in agreement with them.  She knows to contact our office if she develops concerns prior to her next appointment.   I provided *** minutes (10:52 AM - 10:52 AM) of face-to-face time during this this encounter and > 50% was spent counseling as documented under my assessment and plan.    Marvia Pickles, PA-C

## 2020-08-28 ENCOUNTER — Other Ambulatory Visit: Payer: Self-pay | Admitting: Pharmacist

## 2020-09-04 ENCOUNTER — Telehealth: Payer: Self-pay

## 2020-09-04 ENCOUNTER — Inpatient Hospital Stay: Payer: Medicaid Other

## 2020-09-04 NOTE — Telephone Encounter (Signed)
1558: Called patient no showed today. She states depends on a ride know one to bring her to appts. She said scheduler called and is already working on her appt.

## 2020-10-02 ENCOUNTER — Ambulatory Visit: Payer: Medicaid Other

## 2020-10-02 ENCOUNTER — Other Ambulatory Visit: Payer: Self-pay

## 2020-10-02 ENCOUNTER — Inpatient Hospital Stay: Payer: Medicaid Other | Attending: Oncology

## 2020-10-02 VITALS — BP 148/76 | HR 104 | Temp 98.2°F | Resp 18 | Ht 67.0 in | Wt 122.0 lb

## 2020-10-02 DIAGNOSIS — E538 Deficiency of other specified B group vitamins: Secondary | ICD-10-CM | POA: Diagnosis present

## 2020-10-02 MED ORDER — CYANOCOBALAMIN 1000 MCG/ML IJ SOLN
INTRAMUSCULAR | Status: AC
  Filled 2020-10-02: qty 1

## 2020-10-02 MED ORDER — CYANOCOBALAMIN 1000 MCG/ML IJ SOLN
1000.0000 ug | Freq: Once | INTRAMUSCULAR | Status: AC
  Administered 2020-10-02: 1000 ug via INTRAMUSCULAR

## 2020-10-02 NOTE — Patient Instructions (Signed)

## 2020-10-03 DIAGNOSIS — Z85048 Personal history of other malignant neoplasm of rectum, rectosigmoid junction, and anus: Secondary | ICD-10-CM | POA: Insufficient documentation

## 2020-10-03 DIAGNOSIS — K219 Gastro-esophageal reflux disease without esophagitis: Secondary | ICD-10-CM | POA: Insufficient documentation

## 2020-10-12 ENCOUNTER — Ambulatory Visit: Payer: Medicaid Other | Admitting: Podiatry

## 2020-10-12 ENCOUNTER — Encounter: Payer: Self-pay | Admitting: Podiatry

## 2020-10-12 ENCOUNTER — Other Ambulatory Visit: Payer: Self-pay

## 2020-10-12 DIAGNOSIS — M79676 Pain in unspecified toe(s): Secondary | ICD-10-CM

## 2020-10-12 DIAGNOSIS — L603 Nail dystrophy: Secondary | ICD-10-CM | POA: Diagnosis not present

## 2020-10-12 NOTE — Progress Notes (Signed)
  Subjective:  Patient ID: Jill Williams, female    DOB: 1970/09/04,  MRN: [id number]  Chief Complaint  Patient presents with  . Nail Problem    I went to the ER 2 weeks ago and the toenail was taken off and I did hit it on something and the left big toenail has some fungus   50 y.o. female presents with the above complaint. History confirmed with patient.   Objective:  Physical Exam: warm, good capillary refill, no trophic changes or ulcerative lesions, normal DP and PT pulses and normal sensory exam. Left hallux nail dystrophy with transverse ridging, yellow color, thickening. Healing nail bed right great toe  Assessment:   1. Nail dystrophy   2. Pain around toenail    Plan:  Patient was evaluated and treated and all questions answered.  Ingrown Nail, left -Palliative debridement of nail to patient relief. -We discussed possible removal of the nail with patient. Will consider excision if issues persist.

## 2020-10-26 ENCOUNTER — Other Ambulatory Visit: Payer: Self-pay

## 2020-10-26 ENCOUNTER — Encounter: Payer: Self-pay | Admitting: Podiatry

## 2020-10-26 ENCOUNTER — Ambulatory Visit: Payer: Medicaid Other | Admitting: Podiatry

## 2020-10-26 DIAGNOSIS — B351 Tinea unguium: Secondary | ICD-10-CM

## 2020-10-26 DIAGNOSIS — L603 Nail dystrophy: Secondary | ICD-10-CM

## 2020-10-26 NOTE — Progress Notes (Signed)
  Subjective:  Patient ID: Jill Williams, female    DOB: 12-20-1970,  MRN: [id number]  Chief Complaint  Patient presents with  . Nail Problem    The right big toe nail is better and the left is growing out    50 y.o. female presents with the above complaint. History confirmed with patient.   Objective:  Physical Exam: warm, good capillary refill, no trophic changes or ulcerative lesions, normal DP and PT pulses and normal sensory exam. Left hallux nail dystrophy with transverse ridging, yellow color, thickening. Healing nail bed right great toe  Assessment:   1. Nail dystrophy    Plan:  Patient was evaluated and treated and all questions answered.  Ingrown Nail, left -Temporary removal of the nail as below -Educated on post-procedure soaking.  Procedure: Avulsion of toenail Location: Left 1st  Anesthesia: Lidocaine 1% plain; 3 mL and Marcaine 0.5% plain; 3 mL, digital block. Skin Prep: Betadine. Dressing: Silvadene; telfa; dry, sterile, compression dressing. Technique: Following skin prep, the toe was exsanguinated and a tourniquet was secured at the base of the toe. The nail was freed and avulsed with a hemostat. The area was cleansed. The tourniquet was then removed and sterile dressing applied. Disposition: Patient tolerated procedure well.

## 2020-10-26 NOTE — Patient Instructions (Signed)

## 2020-10-30 ENCOUNTER — Inpatient Hospital Stay: Payer: Medicaid Other

## 2020-11-21 ENCOUNTER — Other Ambulatory Visit: Payer: Self-pay | Admitting: Pharmacist

## 2020-11-27 ENCOUNTER — Inpatient Hospital Stay: Payer: Medicaid Other

## 2020-12-01 ENCOUNTER — Encounter: Payer: Self-pay | Admitting: Oncology

## 2020-12-04 ENCOUNTER — Inpatient Hospital Stay: Payer: Medicaid Other | Attending: Oncology

## 2021-05-30 ENCOUNTER — Telehealth: Payer: Self-pay | Admitting: Oncology

## 2021-05-30 NOTE — Telephone Encounter (Signed)
Per 12/7 Staff Msg, patient scheduled for 06/13/21 Labs, Follow Up - 06/14/21 Injections.  Patient notified

## 2021-06-07 ENCOUNTER — Encounter: Payer: Self-pay | Admitting: Oncology

## 2021-06-07 NOTE — Progress Notes (Signed)
[place]  [address] [place],  [place]  [address] [phone]  Clinic Day:  06/13/2021  Referring physician: Maris Berger, MD   This document serves as a record of services personally performed by [name], MD. It was created on their behalf by [facility], a trained medical scribe. The creation of this record is based on the scribe's personal observations and the provider's statements to them.  CHIEF COMPLAINT:  CC: Stage IIA adenocarcinoma of the rectum  Current Treatment:  Surveillance   HISTORY OF PRESENT ILLNESS:  Jill Williams is a 50 y.o. 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  She presented with intermittent rectal bleeding as well as chronic diarrhea for at least 6 months.  Colonoscopy revealed a mass in the right posterior wall about 3 cm from the anal verge extending to 8 cm above the anal verge, encompassing more than half of the mucosal wall.  Biopsy revealed invasive adenocarcinoma.  Neoadjuvant chemoradiation was recommended, to be followed by adjuvant FOLFOX postop.  CT imaging revealed a 6 cm annual lower rectal mass with a small right perirectal node measuring 5 mm and a left perirectal node measuring 1.8 cm.  There was a 1 cm calcified right liver lesion felt to be benign.  There were scattered subcentimeter bilateral lung nodules, the largest measuring 6 mm.  PET revealed an extensive hypermetabolic rectal mass with an SUV of 12.5.  The left pelvic sidewall lymph node had decreased to 6.5 mm and was not hypermetabolic.  The right 5 mm perirectal lymph node was not hypermetabolic.  The lung nodules were not hypermetabolic, but continued follow-up was recommended.  She received neoadjuvant chemoradiation with infusional 5 fluorouracil week 1 and week 5 and started treatment on March 16th.  She had severe chronic diarrhea, but continued daily radiation.   She  has a history of a stroke in 2015 which has left her with left upper extremity paraplegia and left lower extremity weakness and foot drop.  She requires a brace for her left ankle.  She is status post right endarterectomy.  She also has hypertension, hyperlipidemia, peripheral vascular disease, osteoarthritis, gastroesophageal reflux disease, and COPD.  Chest x-ray done December 30th clearly showed hyperinflation.  Unfortunately, she continues to smoke.  She has not had further menstrual periods since her stroke. She will eventually require a left carotid endarterectomy.  Due to her having rectal cancer at age 1, she met criteria for testing for hereditary colorectal cancer, but she initially declined.  Review of her family history reveals her father had lung cancer at an unknown age, a maternal aunt had breast cancer at age 60, her maternal grandmother had an unknown cancer at an unknown age and a paternal aunt had breast cancer at an unknown age.  Myriad myRisk Hereditary Cancer Panel test did not reveal any clinically significant mutation or variant of uncertain significance.  Her lifetime breast cancer risk was estimated at 7.8%.  She underwent surgical resection in July 2020 and was found to have an excellent response to neoadjuvant chemoradiation.  Pathology revealed scant residual foci of adenocarcinoma with the largest foci measuring 0.4 cm.  Eleven lymph nodes were negative for metastasis.  She has had a lot of difficulty with the colostomy bags and ended up in a rehab facility during her postop chemotherapy.  She was recommended for adjuvant FOLFOX chemotherapy.  Her son died in February 23, 2023 in a  shooting accident and she stopped eating and drinking for 2 weeks.  She was then admitted to the hospital with a sodium of 166 and was in for several days of IV fluids.  We felt this was from dehydration and malnutrition.  MRI brain, did not reveal acute findings, the massive prior stroke with encephalomalacia was  seen.  She had been deteriorating with weight loss, anorexia and depression.  She finally agreed to take trazodone 75 mg daily.  She was severely weak.  We discontinued chemotherapy after 5 cycles due to her performance status.  However, her long term prognosis is still good.  She is on  B12 supplementation.  She underwent colostomy reversal on January 21st at [facility] with Dr. Leighton Ruff.    INTERVAL HISTORY:  Jill Williams is here for follow up and doing very well. Her appetite is good and she has continued to gain weight, another 7 pounds. She is on monthly B12 injections for B12 deficiency. She is down to smoking 8 cigarettes a day. She has a much better living situation now and is in great spirits. CBC and CMP are completely normal. CEA is pending.  She denies fever, chills or other signs of infection.  She denies nausea, vomiting, bowel issues, or abdominal pain.  She denies sore throat, cough, dyspnea, or chest pain.  REVIEW OF SYSTEMS:  Review of Systems  Constitutional: Negative.  Negative for appetite change, chills, fatigue, fever and unexpected weight change.  HENT:  Negative.    Eyes: Negative.   Respiratory: Negative.  Negative for chest tightness, cough, hemoptysis, shortness of breath and wheezing.   Cardiovascular: Negative.  Negative for chest pain, leg swelling and palpitations.  Gastrointestinal: Negative.  Negative for abdominal distention, abdominal pain, blood in stool, constipation, diarrhea, nausea and vomiting.  Endocrine: Negative.   Genitourinary: Negative.  Negative for difficulty urinating, dysuria, frequency and hematuria.   Musculoskeletal:  Negative for arthralgias, back pain, flank pain, gait problem and myalgias.  Skin: Negative.   Neurological:  Positive for extremity weakness (chronic of the right side from prior stroke). Negative for dizziness, gait problem, headaches, light-headedness, numbness, seizures and speech difficulty.  Hematological: Negative.    Psychiatric/Behavioral: Negative.  Negative for depression and sleep disturbance. The patient is not nervous/anxious.     VITALS:  Blood pressure (!) 171/83, pulse 85, temperature 98.1 F (36.7 C), temperature source Oral, resp. rate 18, height _0  (1.702 m), weight 129 lb 1.6 oz (58.6 kg), last menstrual period 07/18/2014, SpO2 100 %.  Wt Readings from Last 3 Encounters:  06/14/21 131 lb (59.4 kg)  06/13/21 129 lb 1.6 oz (58.6 kg)  10/02/20 122 lb (55.3 kg)    Body mass index is 20.22 kg/m.  Performance status (ECOG): 1 - Symptomatic but completely ambulatory  PHYSICAL EXAM:  Physical Exam Constitutional:      General: She is not in acute distress.    Appearance: Normal appearance. She is normal weight.  HENT:     Head: Normocephalic and atraumatic.  Eyes:     General: No scleral icterus.    Extraocular Movements: Extraocular movements intact.     Conjunctiva/sclera: Conjunctivae normal.     Pupils: Pupils are equal, round, and reactive to light.  Cardiovascular:     Rate and Rhythm: Normal rate and regular rhythm.     Pulses: Normal pulses.     Heart sounds: Normal heart sounds. No murmur heard.   No friction rub. No gallop.  Pulmonary:  Effort: Pulmonary effort is normal. No respiratory distress.     Breath sounds: Normal breath sounds.  Abdominal:     General: Bowel sounds are normal. There is no distension.     Palpations: Abdomen is soft. There is no hepatomegaly, splenomegaly or mass.     Tenderness: There is no abdominal tenderness.  Musculoskeletal:        General: Normal range of motion.     Cervical back: Normal range of motion and neck supple.     Right lower leg: No edema.     Left lower leg: No edema.  Lymphadenopathy:     Cervical: No cervical adenopathy.  Skin:    General: Skin is warm and dry.  Neurological:     Mental Status: She is alert and oriented to person, place, and time. Mental status is at baseline.     Comments: Right hemiparesis  with right foot drop  Psychiatric:        Mood and Affect: Mood normal.        Behavior: Behavior normal.        Thought Content: Thought content normal.        Judgment: Judgment normal.    LABS:   CBC Latest Ref Rng & Units 06/13/2021 05/12/2020 07/26/2019  WBC - 6.3 6.4 4.7  Hemoglobin 12.0 - 16.0 15.6 13.8 10.6(L)  Hematocrit 36 - 46 47(A) 40 33.9(L)  Platelets 150 - 399 242 206 182   CMP Latest Ref Rng & Units 06/13/2021 05/12/2020 07/26/2019  Glucose 70 - 99 mg/dL - - 84  BUN 4 - _0 Creatinine 0.5 - 1.1 0.8 0.8 0.58  Sodium 137 - 147 141 141 143  Potassium 3.4 - 5.3 4.1 3.8 3.5  Chloride 99 - 108 106 105 110  CO2 13 - 22 27(A) 27(A) 26  Calcium 8.7 - 10.7 9.3 9.7 9.1  Total Protein 6.5 - 8.1 g/dL - - -  Total Bilirubin 0.3 - 1.2 mg/dL - - -  Alkaline Phos 25 - 125 110 100 -  AST 13 - 35 24 27 -  ALT 7 - 35 21 19 -     Lab Results  Component Value Date   CEA1 1.9 06/13/2021   /  CEA  Date Value Ref Range Status  06/13/2021 1.9 0.0 - 4.7 ng/mL Final    Comment:    (NOTE)                             Nonsmokers          <3.9                             Smokers             <5.6 Roche [employer] Electrochemiluminescence Immunoassay (ECLIA) Values obtained with different assay methods or kits cannot be used interchangeably.  Results cannot be interpreted as absolute evidence of the presence or absence of malignant disease. Performed At: [facility] [place], [place] [id number] Rush Farmer MD AG:[id number]      STUDIES:  No results found.   Allergies:  Allergies  Allergen Reactions   Iodine-131 Anaphylaxis   Ivp Dye [Iodinated Contrast Media] Shortness Of Breath and Rash   Other Rash, Shortness Of Breath and Nausea And Vomiting    Other reaction(s): Respiratory Distress (ALLERGY/intolerance)   Morphine Itching  Pneumococcal Vaccine    Codeine Nausea And Vomiting and Itching   Influenza Vaccine Live Rash   Morphine And  Related Rash   Pneumococcal Vaccines Rash   Shellfish Allergy Nausea And Vomiting    Current Medications: Current Outpatient Medications  Medication Sig Dispense Refill   erythromycin ophthalmic ointment Place into the right eye every 6 (six) hours.     sertraline (ZOLOFT) 50 MG tablet Take 1 tablet by mouth daily.     acetaminophen (TYLENOL) 500 MG tablet Take 1,000 mg by mouth every 4 (four) hours as needed for moderate pain.      ascorbic acid (RA VITAMIN C) 500 MG tablet Take 500 mg by mouth 2 (two) times daily. (0900 & 2100)     aspirin 81 MG chewable tablet Chew 81 mg by mouth daily at 6 PM. (1700)     atorvastatin (LIPITOR) 80 MG tablet Take 1 tablet (80 mg total) by mouth daily at 6 PM. (Patient taking differently: Take 80 mg by mouth at bedtime. (2000)) 30 tablet 0   B Complex-C (B-COMPLEX WITH VITAMIN C) tablet Take 1 tablet by mouth daily.     cephALEXin (KEFLEX) 500 MG capsule Take 500 mg by mouth every 8 (eight) hours.     cholecalciferol (VITAMIN D) 25 MCG (1000 UNIT) tablet Take 2,000 Units by mouth daily.     loperamide (IMODIUM) 2 MG capsule Take 1 capsule (2 mg total) by mouth as needed for diarrhea or loose stools. 120 capsule 0   ondansetron (ZOFRAN) 4 MG tablet Take 4 mg by mouth every 6 (six) hours as needed for nausea or vomiting.     ramipril (ALTACE) 2.5 MG capsule Take by mouth.     vitamin B-12 (CYANOCOBALAMIN) 50 MCG tablet Take 50 mcg by mouth daily at 10 pm. (2100)     Zinc 50 MG TABS Take 50 mg by mouth daily.     No current facility-administered medications for this visit.     ASSESSMENT & PLAN:   Assessment:   1. Clinical stage IIA rectal cancer treated with neoadjuvant chemoradiation with excellent response.  She received adjuvant FOLFOX chemotherapy for 5 cycles.  We feel she still has a good prognosis. She has yet to undergo repeat colonoscopy and so this will need to be scheduled.  2. History of tobacco abuse, but is still smoking some now.  3.  History of large stroke with persistent left hemiparesis.  She will likely need to have carotid endarterectomy on the left side also eventually.   4. Colorectal cancer at early age.  Myriad myRisk Hereditary Cancer Panel did not reveal any clinically significant mutation or variant of uncertain significance.  5. B12 deficiency. Her B12 level is quite low at 156 despite oral supplement, so she is now on B12 injections.  Plan: She is now in a better living situation, and doing much better.  She continues to focus on gaining weight.  We will see her back in 3 months with CBC, CMP, and CEA. She is due for repeat colonoscopy. She verbalizes understanding of an agreement to the plan today.  She knows to call the office should any new questions or concerns arise.   I provided 15 minutes of face-to-face time during this this encounter and > 50% was spent counseling as documented under my assessment and plan.   Derwood Kaplan, MD [facility] AT [facility] [address] [place] [address] Dept: [phone] Dept Fax:  [place], Rita Ohara, am acting as scribe for Derwood Kaplan, MD  I have reviewed this report as typed by the medical scribe, and it is complete and accurate.

## 2021-06-09 ENCOUNTER — Other Ambulatory Visit: Payer: Self-pay | Admitting: Oncology

## 2021-06-09 DIAGNOSIS — C20 Malignant neoplasm of rectum: Secondary | ICD-10-CM

## 2021-06-09 DIAGNOSIS — E538 Deficiency of other specified B group vitamins: Secondary | ICD-10-CM

## 2021-06-13 ENCOUNTER — Other Ambulatory Visit: Payer: Self-pay | Admitting: Oncology

## 2021-06-13 ENCOUNTER — Telehealth: Payer: Self-pay

## 2021-06-13 ENCOUNTER — Inpatient Hospital Stay: Payer: Medicaid Other | Attending: Oncology

## 2021-06-13 ENCOUNTER — Other Ambulatory Visit: Payer: Self-pay

## 2021-06-13 ENCOUNTER — Encounter: Payer: Self-pay | Admitting: Oncology

## 2021-06-13 ENCOUNTER — Inpatient Hospital Stay (INDEPENDENT_AMBULATORY_CARE_PROVIDER_SITE_OTHER): Payer: Medicaid Other | Admitting: Oncology

## 2021-06-13 ENCOUNTER — Other Ambulatory Visit: Payer: Self-pay | Admitting: Hematology and Oncology

## 2021-06-13 VITALS — BP 171/83 | HR 85 | Temp 98.1°F | Resp 18 | Ht 67.0 in | Wt 129.1 lb

## 2021-06-13 DIAGNOSIS — E538 Deficiency of other specified B group vitamins: Secondary | ICD-10-CM | POA: Insufficient documentation

## 2021-06-13 DIAGNOSIS — C20 Malignant neoplasm of rectum: Secondary | ICD-10-CM | POA: Insufficient documentation

## 2021-06-13 LAB — COMPREHENSIVE METABOLIC PANEL
Albumin: 4.6 (ref 3.5–5.0)
Calcium: 9.3 (ref 8.7–10.7)

## 2021-06-13 LAB — CBC: RBC: 5.13 — AB (ref 3.87–5.11)

## 2021-06-13 LAB — BASIC METABOLIC PANEL
BUN: 12 (ref 4–21)
CO2: 27 — AB (ref 13–22)
Chloride: 106 (ref 99–108)
Creatinine: 0.8 (ref 0.5–1.1)
Glucose: 92
Potassium: 4.1 (ref 3.4–5.3)
Sodium: 141 (ref 137–147)

## 2021-06-13 LAB — CBC AND DIFFERENTIAL
HCT: 47 — AB (ref 36–46)
Hemoglobin: 15.6 (ref 12.0–16.0)
Neutrophils Absolute: 4.98
Platelets: 242 (ref 150–399)
WBC: 6.3

## 2021-06-13 LAB — VITAMIN B12: Vitamin B-12: 182 pg/mL (ref 180–914)

## 2021-06-13 LAB — HEPATIC FUNCTION PANEL
ALT: 21 (ref 7–35)
AST: 24 (ref 13–35)
Alkaline Phosphatase: 110 (ref 25–125)
Bilirubin, Total: 0.6

## 2021-06-13 NOTE — Telephone Encounter (Signed)
Patient called wanting to know if she is in remission and if so what date did she go into remission?

## 2021-06-14 ENCOUNTER — Inpatient Hospital Stay: Payer: Medicaid Other

## 2021-06-14 VITALS — BP 165/95 | HR 103 | Temp 98.4°F | Resp 18 | Ht 67.0 in | Wt 131.0 lb

## 2021-06-14 DIAGNOSIS — E538 Deficiency of other specified B group vitamins: Secondary | ICD-10-CM

## 2021-06-14 DIAGNOSIS — C20 Malignant neoplasm of rectum: Secondary | ICD-10-CM | POA: Diagnosis not present

## 2021-06-14 LAB — CEA: CEA: 1.9 ng/mL (ref 0.0–4.7)

## 2021-06-14 MED ORDER — CYANOCOBALAMIN 1000 MCG/ML IJ SOLN
1000.0000 ug | Freq: Once | INTRAMUSCULAR | Status: AC
  Administered 2021-06-14: 1000 ug via INTRAMUSCULAR
  Filled 2021-06-14: qty 1

## 2021-06-14 NOTE — Patient Instructions (Signed)
Vitamin B12 Injection °What is this medication? °Vitamin B12 (VAHY tuh min B12) prevents and treats low vitamin B12 levels in your body. It is used in people who do not get enough vitamin B12 from their diet or when their digestive tract does not absorb enough. Vitamin B12 plays an important role in maintaining the health of your nervous system and red blood cells. °This medicine may be used for other purposes; ask your health care provider or pharmacist if you have questions. °COMMON BRAND NAME(S): B-12 Compliance Kit, B-12 Injection Kit, Cyomin, Dodex, LA-12, Nutri-Twelve, Physicians EZ Use B-12, Primabalt °What should I tell my care team before I take this medication? °They need to know if you have any of these conditions: °Kidney disease °Leber's disease °Megaloblastic anemia °An unusual or allergic reaction to cyanocobalamin, cobalt, other medications, foods, dyes, or preservatives °Pregnant or trying to get pregnant °Breast-feeding °How should I use this medication? °This medication is injected into a muscle or deeply under the skin. It is usually given in a clinic or care team's office. However, your care team may teach you how to inject yourself. Follow all instructions. °Talk to your care team about the use of this medication in children. Special care may be needed. °Overdosage: If you think you have taken too much of this medicine contact a poison control center or emergency room at once. °NOTE: This medicine is only for you. Do not share this medicine with others. °What if I miss a dose? °If you are given your dose at a clinic or care team's office, call to reschedule your appointment. If you give your own injections, and you miss a dose, take it as soon as you can. If it is almost time for your next dose, take only that dose. Do not take double or extra doses. °What may interact with this medication? °Colchicine °Heavy alcohol intake °This list may not describe all possible interactions. Give your health  care provider a list of all the medicines, herbs, non-prescription drugs, or dietary supplements you use. Also tell them if you smoke, drink alcohol, or use illegal drugs. Some items may interact with your medicine. °What should I watch for while using this medication? °Visit your care team regularly. You may need blood work done while you are taking this medication. °You may need to follow a special diet. Talk to your care team. Limit your alcohol intake and avoid smoking to get the best benefit. °What side effects may I notice from receiving this medication? °Side effects that you should report to your care team as soon as possible: °Allergic reactions--skin rash, itching, hives, swelling of the face, lips, tongue, or throat °Swelling of the ankles, hands, or feet °Trouble breathing °Side effects that usually do not require medical attention (report to your care team if they continue or are bothersome): °Diarrhea °This list may not describe all possible side effects. Call your doctor for medical advice about side effects. You may report side effects to FDA at 1-800-FDA-1088. °Where should I keep my medication? °Keep out of the reach of children. °Store at room temperature between 15 and 30 degrees C (59 and 85 degrees F). Protect from light. Throw away any unused medication after the expiration date. °NOTE: This sheet is a summary. It may not cover all possible information. If you have questions about this medicine, talk to your doctor, pharmacist, or health care provider. °© 2022 [employer] Standard (2020-08-09 00:00:00) ° °

## 2021-06-14 NOTE — Progress Notes (Signed)
1344:PT STABLE AT TIME OF DISCHARGE

## 2021-06-18 ENCOUNTER — Encounter: Payer: Self-pay | Admitting: Oncology

## 2021-06-19 ENCOUNTER — Other Ambulatory Visit: Payer: Self-pay | Admitting: Hematology and Oncology

## 2021-06-19 ENCOUNTER — Encounter: Payer: Self-pay | Admitting: Oncology

## 2021-07-06 ENCOUNTER — Encounter: Payer: Self-pay | Admitting: Oncology

## 2021-07-06 NOTE — Progress Notes (Signed)
[place]  [address] [place],  [place]  [address] [phone]  Clinic Day:  07/12/2021  Referring physician: Maris Berger, MD   This document serves as a record of services personally performed by [name], MD. It was created on their behalf by [facility], a trained medical scribe. The creation of this record is based on the scribe's personal observations and the provider's statements to them.  CHIEF COMPLAINT:  CC: Stage IIA adenocarcinoma of the rectum  Current Treatment:  Surveillance   HISTORY OF PRESENT ILLNESS:  Jill Williams is a 51 y.o. 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  She presented with intermittent rectal bleeding as well as chronic diarrhea for at least 6 months.  Colonoscopy revealed a mass in the right posterior wall about 3 cm from the anal verge extending to 8 cm above the anal verge, encompassing more than half of the mucosal wall.  Biopsy revealed invasive adenocarcinoma.  Neoadjuvant chemoradiation was recommended, to be followed by adjuvant FOLFOX postop.  CT imaging revealed a 6 cm annual lower rectal mass with a small right perirectal node measuring 5 mm and a left perirectal node measuring 1.8 cm.  There was a 1 cm calcified right liver lesion felt to be benign.  There were scattered subcentimeter bilateral lung nodules, the largest measuring 6 mm.  PET revealed an extensive hypermetabolic rectal mass with an SUV of 12.5.  The left pelvic sidewall lymph node had decreased to 6.5 mm and was not hypermetabolic.  The right 5 mm perirectal lymph node was not hypermetabolic.  The lung nodules were not hypermetabolic, but continued follow-up was recommended.  She received neoadjuvant chemoradiation with infusional 5 fluorouracil week 1 and week 5 and started treatment on March 16th.  She had severe chronic diarrhea, but continued daily radiation.   She  has a history of a stroke in 2015 which has left her with left upper extremity paraplegia and left lower extremity weakness and foot drop.  She requires a brace for her left ankle.  She is status post right endarterectomy.  She also has hypertension, hyperlipidemia, peripheral vascular disease, osteoarthritis, gastroesophageal reflux disease, and COPD.  Chest x-ray done December 30th clearly showed hyperinflation.  Unfortunately, she continues to smoke.  She has not had further menstrual periods since her stroke. She will eventually require a left carotid endarterectomy.  Due to her having rectal cancer at age 24, she met criteria for testing for hereditary colorectal cancer, but she initially declined.  Review of her family history reveals her father had lung cancer at an unknown age, a maternal aunt had breast cancer at age 53, her maternal grandmother had an unknown cancer at an unknown age and a paternal aunt had breast cancer at an unknown age.  Myriad myRisk Hereditary Cancer Panel test did not reveal any clinically significant mutation or variant of uncertain significance.  Her lifetime breast cancer risk was estimated at 7.8%.  She underwent surgical resection in July 2020 and was found to have an excellent response to neoadjuvant chemoradiation.  Pathology revealed scant residual foci of adenocarcinoma with the largest foci measuring 0.4 cm.  Eleven lymph nodes were negative for metastasis.  She has had a lot of difficulty with the colostomy bags and ended up in a rehab facility during her postop chemotherapy.  She was recommended for adjuvant FOLFOX chemotherapy.  Her son died in March 16, 2023 in  a shooting accident and she stopped eating and drinking for 2 weeks.  She was then admitted to the hospital with a sodium of 166 and was in for several days of IV fluids.  We felt this was from dehydration and malnutrition.  MRI brain, did not reveal acute findings, the massive prior stroke with encephalomalacia was  seen.  She had been deteriorating with weight loss, anorexia and depression.  She finally agreed to take trazodone 75 mg daily.  She was severely weak.  We discontinued chemotherapy after 5 cycles due to her performance status.  However, her long term prognosis is still good.  She is on  B12 supplementation.  She underwent colostomy reversal on January 21st at [facility] with Dr. Leighton Ruff.    INTERVAL HISTORY:  Jill Williams is here for follow up and doing very well. Her appetite is good and she has continued to gain weight. She is on monthly B12 injections for B12 deficiency and so I told her she could stop her oral B12. I think she has true pernicious anemia, I.e. a lack of intrinsic factor. She is scheduled for colonoscopy in March with Dr. Lyda Jester. She is down to smoking 8 cigarettes a day. She has a much better living situation now and is in great spirits. CEA from last month is normal.  She denies fever, chills or other signs of infection.  She denies nausea, vomiting, bowel issues, or abdominal pain.  She denies sore throat, cough, dyspnea, or chest pain.  REVIEW OF SYSTEMS:  Review of Systems  Constitutional: Negative.  Negative for appetite change, chills, fatigue, fever and unexpected weight change.  HENT:  Negative.    Eyes: Negative.   Respiratory: Negative.  Negative for chest tightness, cough, hemoptysis, shortness of breath and wheezing.   Cardiovascular: Negative.  Negative for chest pain, leg swelling and palpitations.  Gastrointestinal: Negative.  Negative for abdominal distention, abdominal pain, blood in stool, constipation, diarrhea, nausea and vomiting.  Endocrine: Negative.   Genitourinary: Negative.  Negative for difficulty urinating, dysuria, frequency and hematuria.   Musculoskeletal:  Negative for arthralgias, back pain, flank pain, gait problem and myalgias.  Skin: Negative.   Neurological:  Positive for extremity weakness (chronic of the right side from prior stroke).  Negative for dizziness, gait problem, headaches, light-headedness, numbness, seizures and speech difficulty.  Hematological: Negative.   Psychiatric/Behavioral: Negative.  Negative for depression and sleep disturbance. The patient is not nervous/anxious.     VITALS:  Blood pressure (!) 163/81, pulse 83, temperature 98 F (36.7 C), temperature source Oral, resp. rate 18, height 5' 7" (1.702 m), weight 129 lb (58.5 kg), last menstrual period 07/18/2014, SpO2 98 %.  Wt Readings from Last 3 Encounters:  07/16/21 129 lb (58.5 kg)  07/12/21 129 lb (58.5 kg)  06/14/21 131 lb (59.4 kg)    Body mass index is 20.2 kg/m.  Performance status (ECOG): 1 - Symptomatic but completely ambulatory  PHYSICAL EXAM:  Physical Exam Constitutional:      General: She is not in acute distress.    Appearance: Normal appearance. She is normal weight.  HENT:     Head: Normocephalic and atraumatic.  Eyes:     General: No scleral icterus.    Extraocular Movements: Extraocular movements intact.     Conjunctiva/sclera: Conjunctivae normal.     Pupils: Pupils are equal, round, and reactive to light.  Cardiovascular:     Rate and Rhythm: Normal rate and regular rhythm.     Pulses: Normal pulses.  Heart sounds: Normal heart sounds. No murmur heard.   No friction rub. No gallop.  Pulmonary:     Effort: Pulmonary effort is normal. No respiratory distress.     Breath sounds: Normal breath sounds.  Abdominal:     General: Bowel sounds are normal. There is no distension.     Palpations: Abdomen is soft. There is no hepatomegaly, splenomegaly or mass.     Tenderness: There is no abdominal tenderness.  Musculoskeletal:        General: Normal range of motion.     Cervical back: Normal range of motion and neck supple.     Right lower leg: No edema.     Left lower leg: No edema.  Lymphadenopathy:     Cervical: No cervical adenopathy.  Skin:    General: Skin is warm and dry.  Neurological:     Mental Status:  She is alert and oriented to person, place, and time. Mental status is at baseline.     Comments: Right hemiparesis with right foot drop  Psychiatric:        Mood and Affect: Mood normal.        Behavior: Behavior normal.        Thought Content: Thought content normal.        Judgment: Judgment normal.   With a brace in place LABS:   CBC Latest Ref Rng & Units 06/13/2021 05/12/2020 07/26/2019  WBC - 6.3 6.4 4.7  Hemoglobin 12.0 - 16.0 15.6 13.8 10.6(L)  Hematocrit 36 - 46 47(A) 40 33.9(L)  Platelets 150 - 399 242 206 182   CMP Latest Ref Rng & Units 06/13/2021 05/12/2020 07/26/2019  Glucose 70 - 99 mg/dL - - 84  BUN 4 - _0 Creatinine 0.5 - 1.1 0.8 0.8 0.58  Sodium 137 - 147 141 141 143  Potassium 3.4 - 5.3 4.1 3.8 3.5  Chloride 99 - 108 106 105 110  CO2 13 - 22 27(A) 27(A) 26  Calcium 8.7 - 10.7 9.3 9.7 9.1  Total Protein 6.5 - 8.1 g/dL - - -  Total Bilirubin 0.3 - 1.2 mg/dL - - -  Alkaline Phos 25 - 125 110 100 -  AST 13 - 35 24 27 -  ALT 7 - 35 21 19 -     Lab Results  Component Value Date   CEA1 1.9 06/13/2021   /  CEA  Date Value Ref Range Status  06/13/2021 1.9 0.0 - 4.7 ng/mL Final    Comment:    (NOTE)                             Nonsmokers          <3.9                             Smokers             <5.6 Roche [employer] Electrochemiluminescence Immunoassay (ECLIA) Values obtained with different assay methods or kits cannot be used interchangeably.  Results cannot be interpreted as absolute evidence of the presence or absence of malignant disease. Performed At: [facility] [place], [place] [id number] Rush Farmer MD MG:[id number]      STUDIES:  No results found.   Allergies:  Allergies  Allergen Reactions   Iodine-131 Anaphylaxis   Ivp Dye [Iodinated Contrast Media] Shortness Of Breath and Rash  Other Rash, Shortness Of Breath and Nausea And Vomiting    Other reaction(s): Respiratory Distress  (ALLERGY/intolerance)   Morphine Itching   Pneumococcal Vaccine    Codeine Nausea And Vomiting and Itching   Influenza Vaccine Live Rash   Morphine And Related Rash   Pneumococcal Vaccines Rash   Shellfish Allergy Nausea And Vomiting    Current Medications: Current Outpatient Medications  Medication Sig Dispense Refill   acetaminophen (TYLENOL) 500 MG tablet Take 1,000 mg by mouth every 4 (four) hours as needed for moderate pain.      ascorbic acid (RA VITAMIN C) 500 MG tablet Take 500 mg by mouth 2 (two) times daily. (0900 & 2100)     aspirin 81 MG chewable tablet Chew 81 mg by mouth daily at 6 PM. (1700)     atorvastatin (LIPITOR) 80 MG tablet Take 1 tablet (80 mg total) by mouth daily at 6 PM. (Patient taking differently: Take 80 mg by mouth at bedtime. (2000)) 30 tablet 0   B Complex-C (B-COMPLEX WITH VITAMIN C) tablet Take 1 tablet by mouth daily.     cephALEXin (KEFLEX) 500 MG capsule Take 500 mg by mouth every 8 (eight) hours.     cholecalciferol (VITAMIN D) 25 MCG (1000 UNIT) tablet Take 2,000 Units by mouth daily.     erythromycin ophthalmic ointment Place into the right eye every 6 (six) hours.     loperamide (IMODIUM) 2 MG capsule Take 1 capsule (2 mg total) by mouth as needed for diarrhea or loose stools. 120 capsule 0   ondansetron (ZOFRAN) 4 MG tablet Take 4 mg by mouth every 6 (six) hours as needed for nausea or vomiting.     ramipril (ALTACE) 2.5 MG capsule Take by mouth.     sertraline (ZOLOFT) 50 MG tablet Take 1 tablet by mouth daily.     Zinc 50 MG TABS Take 50 mg by mouth daily.     No current facility-administered medications for this visit.     ASSESSMENT & PLAN:   Assessment:   1. Clinical stage IIA rectal cancer treated with neoadjuvant chemoradiation with excellent response.  She received adjuvant FOLFOX chemotherapy for 5 cycles.  We feel she still has a good prognosis. She has yet to undergo repeat colonoscopy and is now scheduled with Dr. Lyda Jester in  March.  2. History of tobacco abuse, but is still smoking some now.  3. History of large stroke with persistent left hemiparesis.  She will likely need to have carotid endarterectomy on the left side also eventually.   4. Colorectal cancer at early age.  Myriad myRisk Hereditary Cancer Panel did not reveal any clinically significant mutation or variant of uncertain significance.  5. B12 deficiency. Her B12 level is quite low at 156 despite oral supplement, so I do consider her to have pernicious anemia and explained that she will need to stay on lifelong B12 injections.   Plan: She is now in a better living situation, and doing much better.  She continues to focus on gaining weight.  We will continue monthly B12 injections. We will see her back in 5-6 months with CBC, CMP, B12 and CEA. She is due for repeat colonoscopy, which is scheduled for March. She verbalizes understanding of an agreement to the plan today.  She knows to call the office should any new questions or concerns arise.   I provided 15 minutes of face-to-face time during this this encounter and > 50% was spent counseling as documented under  my assessment and plan.   Derwood Kaplan, MD [facility] AT [facility] [address] [place] [address] Dept: [phone] Dept Fax: [phone]   I, Rita Ohara, am acting as scribe for Derwood Kaplan, MD  I have reviewed this report as typed by the medical scribe, and it is complete and accurate.

## 2021-07-12 ENCOUNTER — Other Ambulatory Visit: Payer: Self-pay

## 2021-07-12 ENCOUNTER — Telehealth: Payer: Self-pay

## 2021-07-12 ENCOUNTER — Other Ambulatory Visit: Payer: Self-pay | Admitting: Oncology

## 2021-07-12 ENCOUNTER — Telehealth: Payer: Self-pay | Admitting: Oncology

## 2021-07-12 ENCOUNTER — Inpatient Hospital Stay: Payer: Medicaid Other | Attending: Oncology | Admitting: Oncology

## 2021-07-12 ENCOUNTER — Encounter: Payer: Self-pay | Admitting: Oncology

## 2021-07-12 VITALS — BP 163/81 | HR 83 | Temp 98.0°F | Resp 18 | Ht 67.0 in | Wt 129.0 lb

## 2021-07-12 DIAGNOSIS — E538 Deficiency of other specified B group vitamins: Secondary | ICD-10-CM | POA: Insufficient documentation

## 2021-07-12 DIAGNOSIS — C20 Malignant neoplasm of rectum: Secondary | ICD-10-CM | POA: Diagnosis not present

## 2021-07-12 DIAGNOSIS — D510 Vitamin B12 deficiency anemia due to intrinsic factor deficiency: Secondary | ICD-10-CM | POA: Insufficient documentation

## 2021-07-12 NOTE — Telephone Encounter (Signed)
-----   Message from Derwood Kaplan, MD sent at 07/12/2021 10:58 AM EST ----- Regarding: meds I told her she can stop the oral B12, not being absorbed, she has pernicious anemia

## 2021-07-12 NOTE — Telephone Encounter (Signed)
Patient has been scheduled for follow-up visit per 07/12/21 los. Pt given an appt calendar with date and time.

## 2021-07-12 NOTE — Telephone Encounter (Signed)
Taken off med list

## 2021-07-16 ENCOUNTER — Inpatient Hospital Stay: Payer: Medicaid Other

## 2021-07-16 ENCOUNTER — Other Ambulatory Visit: Payer: Self-pay

## 2021-07-16 VITALS — BP 157/77 | HR 102 | Temp 97.7°F | Resp 18 | Ht 67.0 in | Wt 129.0 lb

## 2021-07-16 DIAGNOSIS — E538 Deficiency of other specified B group vitamins: Secondary | ICD-10-CM | POA: Diagnosis present

## 2021-07-16 MED ORDER — CYANOCOBALAMIN 1000 MCG/ML IJ SOLN
1000.0000 ug | Freq: Once | INTRAMUSCULAR | Status: AC
  Administered 2021-07-16: 1000 ug via INTRAMUSCULAR
  Filled 2021-07-16: qty 1

## 2021-07-16 NOTE — Patient Instructions (Signed)
Vitamin B12 Injection °What is this medication? °Vitamin B12 (VAHY tuh min B12) prevents and treats low vitamin B12 levels in your body. It is used in people who do not get enough vitamin B12 from their diet or when their digestive tract does not absorb enough. Vitamin B12 plays an important role in maintaining the health of your nervous system and red blood cells. °This medicine may be used for other purposes; ask your health care provider or pharmacist if you have questions. °COMMON BRAND NAME(S): B-12 Compliance Kit, B-12 Injection Kit, Cyomin, Dodex, LA-12, Nutri-Twelve, Physicians EZ Use B-12, Primabalt °What should I tell my care team before I take this medication? °They need to know if you have any of these conditions: °Kidney disease °Leber's disease °Megaloblastic anemia °An unusual or allergic reaction to cyanocobalamin, cobalt, other medications, foods, dyes, or preservatives °Pregnant or trying to get pregnant °Breast-feeding °How should I use this medication? °This medication is injected into a muscle or deeply under the skin. It is usually given in a clinic or care team's office. However, your care team may teach you how to inject yourself. Follow all instructions. °Talk to your care team about the use of this medication in children. Special care may be needed. °Overdosage: If you think you have taken too much of this medicine contact a poison control center or emergency room at once. °NOTE: This medicine is only for you. Do not share this medicine with others. °What if I miss a dose? °If you are given your dose at a clinic or care team's office, call to reschedule your appointment. If you give your own injections, and you miss a dose, take it as soon as you can. If it is almost time for your next dose, take only that dose. Do not take double or extra doses. °What may interact with this medication? °Colchicine °Heavy alcohol intake °This list may not describe all possible interactions. Give your health  care provider a list of all the medicines, herbs, non-prescription drugs, or dietary supplements you use. Also tell them if you smoke, drink alcohol, or use illegal drugs. Some items may interact with your medicine. °What should I watch for while using this medication? °Visit your care team regularly. You may need blood work done while you are taking this medication. °You may need to follow a special diet. Talk to your care team. Limit your alcohol intake and avoid smoking to get the best benefit. °What side effects may I notice from receiving this medication? °Side effects that you should report to your care team as soon as possible: °Allergic reactions--skin rash, itching, hives, swelling of the face, lips, tongue, or throat °Swelling of the ankles, hands, or feet °Trouble breathing °Side effects that usually do not require medical attention (report to your care team if they continue or are bothersome): °Diarrhea °This list may not describe all possible side effects. Call your doctor for medical advice about side effects. You may report side effects to FDA at 1-800-FDA-1088. °Where should I keep my medication? °Keep out of the reach of children. °Store at room temperature between 15 and 30 degrees C (59 and 85 degrees F). Protect from light. Throw away any unused medication after the expiration date. °NOTE: This sheet is a summary. It may not cover all possible information. If you have questions about this medicine, talk to your doctor, pharmacist, or health care provider. °© 2022 [employer] Standard (2020-08-09 00:00:00) ° °

## 2021-07-22 ENCOUNTER — Encounter: Payer: Self-pay | Admitting: Oncology

## 2021-08-13 ENCOUNTER — Inpatient Hospital Stay: Payer: Medicaid Other | Attending: Oncology

## 2021-08-13 ENCOUNTER — Other Ambulatory Visit: Payer: Self-pay

## 2021-08-13 VITALS — BP 153/78 | HR 91 | Temp 98.1°F | Resp 18 | Ht 67.0 in | Wt 128.0 lb

## 2021-08-13 DIAGNOSIS — E538 Deficiency of other specified B group vitamins: Secondary | ICD-10-CM | POA: Diagnosis present

## 2021-08-13 MED ORDER — CYANOCOBALAMIN 1000 MCG/ML IJ SOLN
1000.0000 ug | Freq: Once | INTRAMUSCULAR | Status: AC
  Administered 2021-08-13: 1000 ug via INTRAMUSCULAR
  Filled 2021-08-13: qty 1

## 2021-08-13 NOTE — Patient Instructions (Signed)
Vitamin B12 Injection ?What is this medication? ?Vitamin B12 (VAHY tuh min B12) prevents and treats low vitamin B12 levels in your body. It is used in people who do not get enough vitamin B12 from their diet or when their digestive tract does not absorb enough. Vitamin B12 plays an important role in maintaining the health of your nervous system and red blood cells. ?This medicine may be used for other purposes; ask your health care provider or pharmacist if you have questions. ?COMMON BRAND NAME(S): B-12 Compliance Kit, B-12 Injection Kit, Cyomin, Dodex, LA-12, Nutri-Twelve, Physicians EZ Use B-12, Primabalt ?What should I tell my care team before I take this medication? ?They need to know if you have any of these conditions: ?Kidney disease ?Leber's disease ?Megaloblastic anemia ?An unusual or allergic reaction to cyanocobalamin, cobalt, other medications, foods, dyes, or preservatives ?Pregnant or trying to get pregnant ?Breast-feeding ?How should I use this medication? ?This medication is injected into a muscle or deeply under the skin. It is usually given in a clinic or care team's office. However, your care team may teach you how to inject yourself. Follow all instructions. ?Talk to your care team about the use of this medication in children. Special care may be needed. ?Overdosage: If you think you have taken too much of this medicine contact a poison control center or emergency room at once. ?NOTE: This medicine is only for you. Do not share this medicine with others. ?What if I miss a dose? ?If you are given your dose at a clinic or care team's office, call to reschedule your appointment. If you give your own injections, and you miss a dose, take it as soon as you can. If it is almost time for your next dose, take only that dose. Do not take double or extra doses. ?What may interact with this medication? ?Colchicine ?Heavy alcohol intake ?This list may not describe all possible interactions. Give your health  care provider a list of all the medicines, herbs, non-prescription drugs, or dietary supplements you use. Also tell them if you smoke, drink alcohol, or use illegal drugs. Some items may interact with your medicine. ?What should I watch for while using this medication? ?Visit your care team regularly. You may need blood work done while you are taking this medication. ?You may need to follow a special diet. Talk to your care team. Limit your alcohol intake and avoid smoking to get the best benefit. ?What side effects may I notice from receiving this medication? ?Side effects that you should report to your care team as soon as possible: ?Allergic reactions--skin rash, itching, hives, swelling of the face, lips, tongue, or throat ?Swelling of the ankles, hands, or feet ?Trouble breathing ?Side effects that usually do not require medical attention (report to your care team if they continue or are bothersome): ?Diarrhea ?This list may not describe all possible side effects. Call your doctor for medical advice about side effects. You may report side effects to FDA at 1-800-FDA-1088. ?Where should I keep my medication? ?Keep out of the reach of children. ?Store at room temperature between 15 and 30 degrees C (59 and 85 degrees F). Protect from light. Throw away any unused medication after the expiration date. ?NOTE: This sheet is a summary. It may not cover all possible information. If you have questions about this medicine, talk to your doctor, pharmacist, or health care provider. ?? 2022 Elsevier/Gold Standard (2020-08-09 00:00:00) ? ?

## 2021-09-12 ENCOUNTER — Other Ambulatory Visit: Payer: Self-pay | Admitting: Pharmacist

## 2021-09-13 ENCOUNTER — Ambulatory Visit: Payer: Medicaid Other

## 2021-09-13 ENCOUNTER — Encounter: Payer: Self-pay | Admitting: Specialist

## 2021-09-13 ENCOUNTER — Telehealth: Payer: Self-pay

## 2021-09-14 ENCOUNTER — Ambulatory Visit: Payer: Medicaid Other

## 2021-09-17 ENCOUNTER — Other Ambulatory Visit: Payer: Self-pay | Admitting: Pharmacist

## 2021-09-18 ENCOUNTER — Ambulatory Visit: Payer: Medicaid Other

## 2021-09-19 ENCOUNTER — Inpatient Hospital Stay: Payer: Medicaid Other | Attending: Oncology

## 2021-09-19 VITALS — BP 160/87 | HR 88 | Temp 98.2°F | Resp 18 | Ht 67.0 in | Wt 129.0 lb

## 2021-09-19 DIAGNOSIS — E538 Deficiency of other specified B group vitamins: Secondary | ICD-10-CM | POA: Insufficient documentation

## 2021-09-19 MED ORDER — CYANOCOBALAMIN 1000 MCG/ML IJ SOLN
1000.0000 ug | Freq: Once | INTRAMUSCULAR | Status: AC
  Administered 2021-09-19: 1000 ug via INTRAMUSCULAR
  Filled 2021-09-19: qty 1

## 2021-09-19 NOTE — Patient Instructions (Signed)
Vitamin B12 Injection ?What is this medication? ?Vitamin B12 (VAHY tuh min B12) prevents and treats low vitamin B12 levels in your body. It is used in people who do not get enough vitamin B12 from their diet or when their digestive tract does not absorb enough. Vitamin B12 plays an important role in maintaining the health of your nervous system and red blood cells. ?This medicine may be used for other purposes; ask your health care provider or pharmacist if you have questions. ?COMMON BRAND NAME(S): B-12 Compliance Kit, B-12 Injection Kit, Cyomin, Dodex, LA-12, Nutri-Twelve, Physicians EZ Use B-12, Primabalt ?What should I tell my care team before I take this medication? ?They need to know if you have any of these conditions: ?Kidney disease ?Leber's disease ?Megaloblastic anemia ?An unusual or allergic reaction to cyanocobalamin, cobalt, other medications, foods, dyes, or preservatives ?Pregnant or trying to get pregnant ?Breast-feeding ?How should I use this medication? ?This medication is injected into a muscle or deeply under the skin. It is usually given in a clinic or care team's office. However, your care team may teach you how to inject yourself. Follow all instructions. ?Talk to your care team about the use of this medication in children. Special care may be needed. ?Overdosage: If you think you have taken too much of this medicine contact a poison control center or emergency room at once. ?NOTE: This medicine is only for you. Do not share this medicine with others. ?What if I miss a dose? ?If you are given your dose at a clinic or care team's office, call to reschedule your appointment. If you give your own injections, and you miss a dose, take it as soon as you can. If it is almost time for your next dose, take only that dose. Do not take double or extra doses. ?What may interact with this medication? ?Colchicine ?Heavy alcohol intake ?This list may not describe all possible interactions. Give your health  care provider a list of all the medicines, herbs, non-prescription drugs, or dietary supplements you use. Also tell them if you smoke, drink alcohol, or use illegal drugs. Some items may interact with your medicine. ?What should I watch for while using this medication? ?Visit your care team regularly. You may need blood work done while you are taking this medication. ?You may need to follow a special diet. Talk to your care team. Limit your alcohol intake and avoid smoking to get the best benefit. ?What side effects may I notice from receiving this medication? ?Side effects that you should report to your care team as soon as possible: ?Allergic reactions--skin rash, itching, hives, swelling of the face, lips, tongue, or throat ?Swelling of the ankles, hands, or feet ?Trouble breathing ?Side effects that usually do not require medical attention (report to your care team if they continue or are bothersome): ?Diarrhea ?This list may not describe all possible side effects. Call your doctor for medical advice about side effects. You may report side effects to FDA at 1-800-FDA-1088. ?Where should I keep my medication? ?Keep out of the reach of children. ?Store at room temperature between 15 and 30 degrees C (59 and 85 degrees F). Protect from light. Throw away any unused medication after the expiration date. ?NOTE: This sheet is a summary. It may not cover all possible information. If you have questions about this medicine, talk to your doctor, pharmacist, or health care provider. ?? 2022 Elsevier/Gold Standard (2020-08-09 00:00:00) ? ?

## 2021-09-20 ENCOUNTER — Ambulatory Visit: Payer: Medicaid Other

## 2021-10-01 LAB — HM MAMMOGRAPHY

## 2021-10-11 ENCOUNTER — Encounter: Payer: Self-pay | Admitting: Oncology

## 2021-10-15 ENCOUNTER — Other Ambulatory Visit: Payer: Self-pay

## 2021-10-15 ENCOUNTER — Inpatient Hospital Stay: Payer: Medicaid Other | Attending: Oncology

## 2021-10-15 VITALS — BP 155/77 | HR 83 | Temp 98.1°F | Resp 20 | Ht 67.0 in

## 2021-10-15 DIAGNOSIS — E538 Deficiency of other specified B group vitamins: Secondary | ICD-10-CM | POA: Insufficient documentation

## 2021-10-15 MED ORDER — CYANOCOBALAMIN 1000 MCG/ML IJ SOLN
1000.0000 ug | Freq: Once | INTRAMUSCULAR | Status: AC
  Administered 2021-10-15: 1000 ug via INTRAMUSCULAR
  Filled 2021-10-15: qty 1

## 2021-10-15 NOTE — Patient Instructions (Signed)
Vitamin B12 Injection ?What is this medication? ?Vitamin B12 (VAHY tuh min B12) prevents and treats low vitamin B12 levels in your body. It is used in people who do not get enough vitamin B12 from their diet or when their digestive tract does not absorb enough. Vitamin B12 plays an important role in maintaining the health of your nervous system and red blood cells. ?This medicine may be used for other purposes; ask your health care provider or pharmacist if you have questions. ?COMMON BRAND NAME(S): B-12 Compliance Kit, B-12 Injection Kit, Cyomin, Dodex, LA-12, Nutri-Twelve, Physicians EZ Use B-12, Primabalt ?What should I tell my care team before I take this medication? ?They need to know if you have any of these conditions: ?Kidney disease ?Leber's disease ?Megaloblastic anemia ?An unusual or allergic reaction to cyanocobalamin, cobalt, other medications, foods, dyes, or preservatives ?Pregnant or trying to get pregnant ?Breast-feeding ?How should I use this medication? ?This medication is injected into a muscle or deeply under the skin. It is usually given in a clinic or care team's office. However, your care team may teach you how to inject yourself. Follow all instructions. ?Talk to your care team about the use of this medication in children. Special care may be needed. ?Overdosage: If you think you have taken too much of this medicine contact a poison control center or emergency room at once. ?NOTE: This medicine is only for you. Do not share this medicine with others. ?What if I miss a dose? ?If you are given your dose at a clinic or care team's office, call to reschedule your appointment. If you give your own injections, and you miss a dose, take it as soon as you can. If it is almost time for your next dose, take only that dose. Do not take double or extra doses. ?What may interact with this medication? ?Alcohol ?Colchicine ?This list may not describe all possible interactions. Give your health care  provider a list of all the medicines, herbs, non-prescription drugs, or dietary supplements you use. Also tell them if you smoke, drink alcohol, or use illegal drugs. Some items may interact with your medicine. ?What should I watch for while using this medication? ?Visit your care team regularly. You may need blood work done while you are taking this medication. ?You may need to follow a special diet. Talk to your care team. Limit your alcohol intake and avoid smoking to get the best benefit. ?What side effects may I notice from receiving this medication? ?Side effects that you should report to your care team as soon as possible: ?Allergic reactions--skin rash, itching, hives, swelling of the face, lips, tongue, or throat ?Swelling of the ankles, hands, or feet ?Trouble breathing ?Side effects that usually do not require medical attention (report to your care team if they continue or are bothersome): ?Diarrhea ?This list may not describe all possible side effects. Call your doctor for medical advice about side effects. You may report side effects to FDA at 1-800-FDA-1088. ?Where should I keep my medication? ?Keep out of the reach of children. ?Store at room temperature between 15 and 30 degrees C (59 and 85 degrees F). Protect from light. Throw away any unused medication after the expiration date. ?NOTE: This sheet is a summary. It may not cover all possible information. If you have questions about this medicine, talk to your doctor, pharmacist, or health care provider. ?? 2023 Elsevier/Gold Standard (2021-02-06 00:00:00) ? ?

## 2021-11-14 ENCOUNTER — Other Ambulatory Visit: Payer: Self-pay | Admitting: Pharmacist

## 2021-11-15 ENCOUNTER — Inpatient Hospital Stay: Payer: Medicaid Other | Attending: Oncology

## 2021-11-15 VITALS — BP 131/75 | HR 92 | Temp 98.2°F | Resp 20 | Ht 67.0 in | Wt 126.0 lb

## 2021-11-15 DIAGNOSIS — E538 Deficiency of other specified B group vitamins: Secondary | ICD-10-CM | POA: Diagnosis present

## 2021-11-15 MED ORDER — CYANOCOBALAMIN 1000 MCG/ML IJ SOLN
1000.0000 ug | Freq: Once | INTRAMUSCULAR | Status: AC
  Administered 2021-11-15: 1000 ug via INTRAMUSCULAR
  Filled 2021-11-15: qty 1

## 2021-11-15 NOTE — Patient Instructions (Signed)
Vitamin B12 Injection What is this medication? Vitamin B12 (VAHY tuh min B12) prevents and treats low vitamin B12 levels in your body. It is used in people who do not get enough vitamin B12 from their diet or when their digestive tract does not absorb enough. Vitamin B12 plays an important role in maintaining the health of your nervous system and red blood cells. This medicine may be used for other purposes; ask your health care provider or pharmacist if you have questions. COMMON BRAND NAME(S): B-12 Compliance Kit, B-12 Injection Kit, Cyomin, Dodex, LA-12, Nutri-Twelve, Physicians EZ Use B-12, Primabalt What should I tell my care team before I take this medication? They need to know if you have any of these conditions: Kidney disease Leber's disease Megaloblastic anemia An unusual or allergic reaction to cyanocobalamin, cobalt, other medications, foods, dyes, or preservatives Pregnant or trying to get pregnant Breast-feeding How should I use this medication? This medication is injected into a muscle or deeply under the skin. It is usually given in a clinic or care team's office. However, your care team may teach you how to inject yourself. Follow all instructions. Talk to your care team about the use of this medication in children. Special care may be needed. Overdosage: If you think you have taken too much of this medicine contact a poison control center or emergency room at once. NOTE: This medicine is only for you. Do not share this medicine with others. What if I miss a dose? If you are given your dose at a clinic or care team's office, call to reschedule your appointment. If you give your own injections, and you miss a dose, take it as soon as you can. If it is almost time for your next dose, take only that dose. Do not take double or extra doses. What may interact with this medication? Alcohol Colchicine This list may not describe all possible interactions. Give your health care  provider a list of all the medicines, herbs, non-prescription drugs, or dietary supplements you use. Also tell them if you smoke, drink alcohol, or use illegal drugs. Some items may interact with your medicine. What should I watch for while using this medication? Visit your care team regularly. You may need blood work done while you are taking this medication. You may need to follow a special diet. Talk to your care team. Limit your alcohol intake and avoid smoking to get the best benefit. What side effects may I notice from receiving this medication? Side effects that you should report to your care team as soon as possible: Allergic reactions--skin rash, itching, hives, swelling of the face, lips, tongue, or throat Swelling of the ankles, hands, or feet Trouble breathing Side effects that usually do not require medical attention (report to your care team if they continue or are bothersome): Diarrhea This list may not describe all possible side effects. Call your doctor for medical advice about side effects. You may report side effects to FDA at 1-800-FDA-1088. Where should I keep my medication? Keep out of the reach of children. Store at room temperature between 15 and 30 degrees C (59 and 85 degrees F). Protect from light. Throw away any unused medication after the expiration date. NOTE: This sheet is a summary. It may not cover all possible information. If you have questions about this medicine, talk to your doctor, pharmacist, or health care provider.  2023 Elsevier/Gold Standard (2021-02-06 00:00:00)

## 2021-12-17 ENCOUNTER — Inpatient Hospital Stay: Payer: Medicaid Other | Attending: Oncology

## 2021-12-17 ENCOUNTER — Other Ambulatory Visit: Payer: Self-pay | Admitting: Pharmacist

## 2021-12-17 VITALS — BP 128/61 | HR 88 | Temp 98.2°F | Resp 20 | Ht 67.0 in | Wt 130.5 lb

## 2021-12-17 DIAGNOSIS — E538 Deficiency of other specified B group vitamins: Secondary | ICD-10-CM | POA: Diagnosis present

## 2021-12-17 MED ORDER — CYANOCOBALAMIN 1000 MCG/ML IJ SOLN
1000.0000 ug | Freq: Once | INTRAMUSCULAR | Status: AC
  Administered 2021-12-17: 1000 ug via INTRAMUSCULAR

## 2021-12-17 NOTE — Patient Instructions (Signed)
Vitamin B12 Injection What is this medication? Vitamin B12 (VAHY tuh min B12) prevents and treats low vitamin B12 levels in your body. It is used in people who do not get enough vitamin B12 from their diet or when their digestive tract does not absorb enough. Vitamin B12 plays an important role in maintaining the health of your nervous system and red blood cells. This medicine may be used for other purposes; ask your health care provider or pharmacist if you have questions. COMMON BRAND NAME(S): B-12 Compliance Kit, B-12 Injection Kit, Cyomin, Dodex, LA-12, Nutri-Twelve, Physicians EZ Use B-12, Primabalt What should I tell my care team before I take this medication? They need to know if you have any of these conditions: Kidney disease Leber's disease Megaloblastic anemia An unusual or allergic reaction to cyanocobalamin, cobalt, other medications, foods, dyes, or preservatives Pregnant or trying to get pregnant Breast-feeding How should I use this medication? This medication is injected into a muscle or deeply under the skin. It is usually given in a clinic or care team's office. However, your care team may teach you how to inject yourself. Follow all instructions. Talk to your care team about the use of this medication in children. Special care may be needed. Overdosage: If you think you have taken too much of this medicine contact a poison control center or emergency room at once. NOTE: This medicine is only for you. Do not share this medicine with others. What if I miss a dose? If you are given your dose at a clinic or care team's office, call to reschedule your appointment. If you give your own injections, and you miss a dose, take it as soon as you can. If it is almost time for your next dose, take only that dose. Do not take double or extra doses. What may interact with this medication? Alcohol Colchicine This list may not describe all possible interactions. Give your health care  provider a list of all the medicines, herbs, non-prescription drugs, or dietary supplements you use. Also tell them if you smoke, drink alcohol, or use illegal drugs. Some items may interact with your medicine. What should I watch for while using this medication? Visit your care team regularly. You may need blood work done while you are taking this medication. You may need to follow a special diet. Talk to your care team. Limit your alcohol intake and avoid smoking to get the best benefit. What side effects may I notice from receiving this medication? Side effects that you should report to your care team as soon as possible: Allergic reactions--skin rash, itching, hives, swelling of the face, lips, tongue, or throat Swelling of the ankles, hands, or feet Trouble breathing Side effects that usually do not require medical attention (report to your care team if they continue or are bothersome): Diarrhea This list may not describe all possible side effects. Call your doctor for medical advice about side effects. You may report side effects to FDA at 1-800-FDA-1088. Where should I keep my medication? Keep out of the reach of children. Store at room temperature between 15 and 30 degrees C (59 and 85 degrees F). Protect from light. Throw away any unused medication after the expiration date. NOTE: This sheet is a summary. It may not cover all possible information. If you have questions about this medicine, talk to your doctor, pharmacist, or health care provider.  2023 Elsevier/Gold Standard (2021-02-06 00:00:00)

## 2022-01-09 ENCOUNTER — Inpatient Hospital Stay: Payer: Medicaid Other | Attending: Oncology

## 2022-01-09 ENCOUNTER — Ambulatory Visit: Payer: Medicaid Other

## 2022-01-09 ENCOUNTER — Ambulatory Visit: Payer: Medicaid Other | Admitting: Oncology

## 2022-01-09 DIAGNOSIS — E538 Deficiency of other specified B group vitamins: Secondary | ICD-10-CM | POA: Insufficient documentation

## 2022-01-09 NOTE — Progress Notes (Deleted)
Jill Williams  [address] [place],  [name]  [id number] [phone]  Clinic Day:  07/12/2021  Referring physician: Maris Berger, MD   This document serves as a record of services personally performed by [name], MD. It was created on their behalf by [facility], a trained medical scribe. The creation of this record is based on the scribe's personal observations and the provider's statements to them.  CHIEF COMPLAINT:  CC: Stage IIA adenocarcinoma of the rectum  Current Treatment:  Surveillance   HISTORY OF PRESENT ILLNESS:  Jill Williams is a 51 y.o. 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  She presented with intermittent rectal bleeding as well as chronic diarrhea for at least 6 months.  Colonoscopy revealed a mass in the right posterior wall about 3 cm from the anal verge extending to 8 cm above the anal verge, encompassing more than half of the mucosal wall.  Biopsy revealed invasive adenocarcinoma.  Neoadjuvant chemoradiation was recommended, to be followed by adjuvant FOLFOX postop.  CT imaging revealed a 6 cm annual lower rectal mass with a small right perirectal node measuring 5 mm and a left perirectal node measuring 1.8 cm.  There was a 1 cm calcified right liver lesion felt to be benign.  There were scattered subcentimeter bilateral lung nodules, the largest measuring 6 mm.  PET revealed an extensive hypermetabolic rectal mass with an SUV of 12.5.  The left pelvic sidewall lymph node had decreased to 6.5 mm and was not hypermetabolic.  The right 5 mm perirectal lymph node was not hypermetabolic.  The lung nodules were not hypermetabolic, but continued follow-up was recommended.  She received neoadjuvant chemoradiation with infusional 5 fluorouracil week 1 and week 5 and started treatment on March 16th.  She had severe chronic diarrhea, but continued daily radiation.   She  has a history of a stroke in 2015 which has left her with left upper extremity paraplegia and left lower extremity weakness and foot drop.  She requires a brace for her left ankle.  She is status post right endarterectomy.  She also has hypertension, hyperlipidemia, peripheral vascular disease, osteoarthritis, gastroesophageal reflux disease, and COPD.  Chest x-ray done December 30th clearly showed hyperinflation.  Unfortunately, she continues to smoke.  She has not had further menstrual periods since her stroke. She will eventually require a left carotid endarterectomy.  Due to her having rectal cancer at age 51, she met criteria for testing for hereditary colorectal cancer, but she initially declined.  Review of her family history reveals her father had lung cancer at an unknown age, a maternal aunt had breast cancer at age 82, her maternal grandmother had an unknown cancer at an unknown age and a paternal aunt had breast cancer at an unknown age.  Myriad myRisk Hereditary Cancer Panel test did not reveal any clinically significant mutation or variant of uncertain significance.  Her lifetime breast cancer risk was estimated at 7.8%.  She underwent surgical resection in July 2020 and was found to have an excellent response to neoadjuvant chemoradiation.  Pathology revealed scant residual foci of adenocarcinoma with the largest foci measuring 0.4 cm.  Eleven lymph nodes were negative for metastasis.  She has had a lot of difficulty with the colostomy bags and ended up in a rehab facility during her postop chemotherapy.  She was recommended for adjuvant FOLFOX chemotherapy.  Her son died in March 23, 2023 in a  shooting accident and she stopped eating and drinking for 2 weeks.  She was then admitted to the hospital with a sodium of 166 and was in for several days of IV fluids.  We felt this was from dehydration and malnutrition.  MRI brain, did not reveal acute findings, the massive prior stroke with encephalomalacia was  seen.  She had been deteriorating with weight loss, anorexia and depression.  She finally agreed to take trazodone 75 mg daily.  She was severely weak.  We discontinued chemotherapy after 5 cycles due to her performance status.  However, her long term prognosis is still good.  She is on  B12 supplementation.  She underwent colostomy reversal on January 21st at [facility] with Dr. Leighton Ruff.    INTERVAL HISTORY:  Jill Williams is here for follow up and doing very well. Her appetite is good and she has continued to gain weight. She is on monthly B12 injections for B12 deficiency and so I told her she could stop her oral B12. I think she has true pernicious anemia, I.e. a lack of intrinsic factor. She is scheduled for colonoscopy in March with Dr. Lyda Jester. She is down to smoking 8 cigarettes a day. She has a much better living situation now and is in great spirits. CEA from last month is normal.  She denies fever, chills or other signs of infection.  She denies nausea, vomiting, bowel issues, or abdominal pain.  She denies sore throat, cough, dyspnea, or chest pain.  REVIEW OF SYSTEMS:  Review of Systems  Constitutional: Negative.  Negative for appetite change, chills, fatigue, fever and unexpected weight change.  HENT:  Negative.    Eyes: Negative.   Respiratory: Negative.  Negative for chest tightness, cough, hemoptysis, shortness of breath and wheezing.   Cardiovascular: Negative.  Negative for chest pain, leg swelling and palpitations.  Gastrointestinal: Negative.  Negative for abdominal distention, abdominal pain, blood in stool, constipation, diarrhea, nausea and vomiting.  Endocrine: Negative.   Genitourinary: Negative.  Negative for difficulty urinating, dysuria, frequency and hematuria.   Musculoskeletal:  Negative for arthralgias, back pain, flank pain, gait problem and myalgias.  Skin: Negative.   Neurological:  Positive for extremity weakness (chronic of the right side from prior stroke).  Negative for dizziness, gait problem, headaches, light-headedness, numbness, seizures and speech difficulty.  Hematological: Negative.   Psychiatric/Behavioral: Negative.  Negative for depression and sleep disturbance. The patient is not nervous/anxious.     VITALS:  Last menstrual period 07/18/2014.  Wt Readings from Last 3 Encounters:  12/17/21 130 lb 8 oz (59.2 kg)  11/15/21 126 lb (57.2 kg)  09/19/21 129 lb (58.5 kg)    There is no height or weight on file to calculate BMI.  Performance status (ECOG): 1 - Symptomatic but completely ambulatory  PHYSICAL EXAM:  Physical Exam Constitutional:      General: She is not in acute distress.    Appearance: Normal appearance. She is normal weight.  HENT:     Head: Normocephalic and atraumatic.  Eyes:     General: No scleral icterus.    Extraocular Movements: Extraocular movements intact.     Conjunctiva/sclera: Conjunctivae normal.     Pupils: Pupils are equal, round, and reactive to light.  Cardiovascular:     Rate and Rhythm: Normal rate and regular rhythm.     Pulses: Normal pulses.     Heart sounds: Normal heart sounds. No murmur heard.    No friction rub. No gallop.  Pulmonary:  Effort: Pulmonary effort is normal. No respiratory distress.     Breath sounds: Normal breath sounds.  Abdominal:     General: Bowel sounds are normal. There is no distension.     Palpations: Abdomen is soft. There is no hepatomegaly, splenomegaly or mass.     Tenderness: There is no abdominal tenderness.  Musculoskeletal:        General: Normal range of motion.     Cervical back: Normal range of motion and neck supple.     Right lower leg: No edema.     Left lower leg: No edema.  Lymphadenopathy:     Cervical: No cervical adenopathy.  Skin:    General: Skin is warm and dry.  Neurological:     Mental Status: She is alert and oriented to person, place, and time. Mental status is at baseline.     Comments: Right hemiparesis with right foot  drop  Psychiatric:        Mood and Affect: Mood normal.        Behavior: Behavior normal.        Thought Content: Thought content normal.        Judgment: Judgment normal.   With a brace in place LABS:      Latest Ref Rng & Units 06/13/2021   12:00 AM 05/12/2020   12:00 AM 07/26/2019    4:02 AM  CBC  WBC  6.3     6.4     4.7   Hemoglobin 12.0 - 16.0 15.6     13.8     10.6   Hematocrit 36 - 46 47     40     33.9   Platelets 150 - 399 242     206     182      This result is from an external source.       Latest Ref Rng & Units 06/13/2021   12:00 AM 05/12/2020   12:00 AM 07/26/2019    4:02 AM  CMP  Glucose 70 - 99 mg/dL   84   BUN 4 - 21 12     15     14    Creatinine 0.5 - 1.1 0.8     0.8     0.58   Sodium 137 - 147 141     141     143   Potassium 3.4 - 5.3 4.1     3.8     3.5   Chloride 99 - 108 106     105     110   CO2 13 - 22 27     27     26    Calcium 8.7 - 10.7 9.3     9.7     9.1   Alkaline Phos 25 - 125 110     100       AST 13 - 35 24     27       ALT 7 - 35 21     19          This result is from an external source.      Lab Results  Component Value Date   CEA1 1.9 06/13/2021   /  CEA  Date Value Ref Range Status  06/13/2021 1.9 0.0 - 4.7 ng/mL Final    Comment:    (NOTE)  Nonsmokers          <3.9                             Smokers             <5.6 Roche [employer] Electrochemiluminescence Immunoassay (ECLIA) Values obtained with different assay methods or kits cannot be used interchangeably.  Results cannot be interpreted as absolute evidence of the presence or absence of malignant disease. Performed At: [facility] [place], [place] [id number] Rush Farmer MD TD:[id number]      STUDIES:  No results found.   Allergies:  Allergies  Allergen Reactions  . Iodine-131 Anaphylaxis  . Ivp Dye [Iodinated Contrast Media] Shortness Of Breath and Rash  . Other Rash, Shortness Of Breath and Nausea  And Vomiting    Other reaction(s): Respiratory Distress (ALLERGY/intolerance)  . Morphine Itching  . Pneumococcal Vaccine   . Codeine Nausea And Vomiting and Itching  . Influenza Vaccine Live Rash  . Morphine And Related Rash  . Pneumococcal Vaccines Rash  . Shellfish Allergy Nausea And Vomiting    Current Medications: Current Outpatient Medications  Medication Sig Dispense Refill  . acetaminophen (TYLENOL) 500 MG tablet Take 1,000 mg by mouth every 4 (four) hours as needed for moderate pain.     Marland Kitchen ascorbic acid (VITAMIN C) 500 MG tablet Take 500 mg by mouth 2 (two) times daily. (0900 & 2100)    . aspirin 81 MG chewable tablet Chew 81 mg by mouth daily at 6 PM. (1700)    . atorvastatin (LIPITOR) 80 MG tablet Take 1 tablet (80 mg total) by mouth daily at 6 PM. (Patient taking differently: Take 80 mg by mouth at bedtime. (2000)) 30 tablet 0  . B Complex-C (B-COMPLEX WITH VITAMIN C) tablet Take 1 tablet by mouth daily.    . cholecalciferol (VITAMIN D) 25 MCG (1000 UNIT) tablet Take 2,000 Units by mouth daily.    Marland Kitchen loperamide (IMODIUM) 2 MG capsule Take 1 capsule (2 mg total) by mouth as needed for diarrhea or loose stools. (Patient not taking: Reported on 10/15/2021) 120 capsule 0  . ramipril (ALTACE) 5 MG capsule Take 1 capsule by mouth daily.    . sertraline (ZOLOFT) 50 MG tablet Take 1 tablet by mouth daily.     No current facility-administered medications for this visit.     ASSESSMENT & PLAN:   Assessment:   1. Clinical stage IIA rectal cancer treated with neoadjuvant chemoradiation with excellent response.  She received adjuvant FOLFOX chemotherapy for 5 cycles.  We feel she still has a good prognosis. She has yet to undergo repeat colonoscopy and is now scheduled with Dr. Lyda Jester in March.  2. History of tobacco abuse, but is still smoking some now.  3. History of large stroke with persistent left hemiparesis.  She will likely need to have carotid endarterectomy on the left  side also eventually.   4. Colorectal cancer at early age.  Myriad myRisk Hereditary Cancer Panel did not reveal any clinically significant mutation or variant of uncertain significance.  5. B12 deficiency. Her B12 level is quite low at 156 despite oral supplement, so I do consider her to have pernicious anemia and explained that she will need to stay on lifelong B12 injections.   Plan: She is now in a better living situation, and doing much better.  She continues to focus on gaining weight.  We will continue monthly  B12 injections. We will see her back in 5-6 months with CBC, CMP, B12 and CEA. She is due for repeat colonoscopy, which is scheduled for March. She verbalizes understanding of an agreement to the plan today.  She knows to call the office should any new questions or concerns arise.   I provided 15 minutes of face-to-face time during this this encounter and > 50% was spent counseling as documented under my assessment and plan.   Derwood Kaplan, MD [facility] [address] [place] [address] Dept: [phone] Dept Fax: [phone]   I, Rita Ohara, am acting as scribe for Derwood Kaplan, MD  I have reviewed this report as typed by the medical scribe, and it is complete and accurate.

## 2022-01-10 ENCOUNTER — Ambulatory Visit: Payer: Medicaid Other

## 2022-01-16 ENCOUNTER — Inpatient Hospital Stay: Payer: Medicaid Other

## 2022-01-16 VITALS — BP 103/60 | HR 81 | Temp 98.3°F | Resp 18 | Ht 67.0 in | Wt 132.0 lb

## 2022-01-16 DIAGNOSIS — E538 Deficiency of other specified B group vitamins: Secondary | ICD-10-CM | POA: Diagnosis not present

## 2022-01-16 MED ORDER — CYANOCOBALAMIN 1000 MCG/ML IJ SOLN
1000.0000 ug | Freq: Once | INTRAMUSCULAR | Status: AC
  Administered 2022-01-16: 1000 ug via INTRAMUSCULAR
  Filled 2022-01-16: qty 1

## 2022-01-24 NOTE — Progress Notes (Shared)
[facility]  [address] [place],  [place]  [address] [phone]  Clinic Day:  01/29/2022  Referring physician: Maris Berger, MD   This document serves as a record of services personally performed by [name], MD. It was created on their behalf by Curry,Lauren E, a trained medical scribe. The creation of this record is based on the scribe's personal observations and the provider's statements to them.  CHIEF COMPLAINT:  CC: Stage IIA adenocarcinoma of the rectum  Current Treatment:  Surveillance   HISTORY OF PRESENT ILLNESS:  Jill Williams is a 51 y.o. 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  She presented with intermittent rectal bleeding as well as chronic diarrhea for at least 6 months.  Colonoscopy revealed a mass in the right posterior wall about 3 cm from the anal verge extending to 8 cm above the anal verge, encompassing more than half of the mucosal wall.  Biopsy revealed invasive adenocarcinoma.  Neoadjuvant chemoradiation was recommended, to be followed by adjuvant FOLFOX postop.  CT imaging revealed a 6 cm annual lower rectal mass with a small right perirectal node measuring 5 mm and a left perirectal node measuring 1.8 cm.  There was a 1 cm calcified right liver lesion felt to be benign.  There were scattered subcentimeter bilateral lung nodules, the largest measuring 6 mm.  PET revealed an extensive hypermetabolic rectal mass with an SUV of 12.5.  The left pelvic sidewall lymph node had decreased to 6.5 mm and was not hypermetabolic.  The right 5 mm perirectal lymph node was not hypermetabolic.  The lung nodules were not hypermetabolic, but continued follow-up was recommended.  She received neoadjuvant chemoradiation with infusional 5 fluorouracil week 1 and week 5 and started treatment on March 16th.  She had severe chronic diarrhea, but continued daily radiation.   She  has a history of a stroke in 2015 which has left her with left upper extremity paraplegia and left lower extremity weakness and foot drop.  She requires a brace for her left ankle.  She is status post right endarterectomy.  She also has hypertension, hyperlipidemia, peripheral vascular disease, osteoarthritis, gastroesophageal reflux disease, and COPD.  Chest x-ray done December 30th clearly showed hyperinflation.  Unfortunately, she continues to smoke.  She has not had further menstrual periods since her stroke. She will eventually require a left carotid endarterectomy.  Due to her having rectal cancer at age 54, she met criteria for testing for hereditary colorectal cancer, but she initially declined.  Review of her family history reveals her father had lung cancer at an unknown age, a maternal aunt had breast cancer at age 49, her maternal grandmother had an unknown cancer at an unknown age and a paternal aunt had breast cancer at an unknown age.  Myriad myRisk Hereditary Cancer Panel test did not reveal any clinically significant mutation or variant of uncertain significance.  Her lifetime breast cancer risk was estimated at 7.8%.  She underwent surgical resection in July 2020 and was found to have an excellent response to neoadjuvant chemoradiation.  Pathology revealed scant residual foci of adenocarcinoma with the largest foci measuring 0.4 cm.  Eleven lymph nodes were negative for metastasis.  She has had a lot of difficulty with the colostomy bags and ended up in a rehab facility during her postop chemotherapy.  She was recommended for adjuvant FOLFOX chemotherapy.  Her son died in Mar 17, 2023 in a  shooting accident and she stopped eating and drinking for 2 weeks.  She was then admitted to the hospital with a sodium of 166 and was in for several days of IV fluids.  We felt this was from dehydration and malnutrition.  MRI brain, did not reveal acute findings, the massive prior stroke with encephalomalacia was  seen.  She had been deteriorating with weight loss, anorexia and depression.  She finally agreed to take trazodone 75 mg daily.  She was severely weak.  We discontinued chemotherapy after 5 cycles due to her performance status.  However, her long term prognosis is still good.  She is on  B12 supplementation.  She underwent colostomy reversal on January 21st at [facility] with Dr. Leighton Ruff.    INTERVAL HISTORY:  Jill Williams is here for follow up and doing very well.           Her appetite is good and she has continued to gain weight.   She is on monthly B12 injections for B12 deficiency and so I told her she could stop her oral B12.   I think she has true pernicious anemia, I.e. a lack of intrinsic factor.   She is scheduled for colonoscopy in March with Dr. Lyda Jester.   She is down to smoking 8 cigarettes a day.   She has a much better living situation now and is in great spirits.   CEA from last month is normal.    She denies fever, chills or other signs of infection.  She denies nausea, vomiting, bowel issues, or abdominal pain.  She denies sore throat, cough, dyspnea, or chest pain.  REVIEW OF SYSTEMS:  Review of Systems  Constitutional: Negative.  Negative for appetite change, chills, fatigue, fever and unexpected weight change.  HENT:  Negative.    Eyes: Negative.   Respiratory: Negative.  Negative for chest tightness, cough, hemoptysis, shortness of breath and wheezing.   Cardiovascular: Negative.  Negative for chest pain, leg swelling and palpitations.  Gastrointestinal: Negative.  Negative for abdominal distention, abdominal pain, blood in stool, constipation, diarrhea, nausea and vomiting.  Endocrine: Negative.   Genitourinary: Negative.  Negative for difficulty urinating, dysuria, frequency and hematuria.   Musculoskeletal:  Negative for arthralgias, back pain, flank pain, gait problem and myalgias.  Skin: Negative.   Neurological:  Positive for extremity weakness  (chronic of the right side from prior stroke). Negative for dizziness, gait problem, headaches, light-headedness, numbness, seizures and speech difficulty.  Hematological: Negative.   Psychiatric/Behavioral: Negative.  Negative for depression and sleep disturbance. The patient is not nervous/anxious.      VITALS:  Last menstrual period 07/18/2014.  Wt Readings from Last 3 Encounters:  01/16/22 132 lb (59.9 kg)  12/17/21 130 lb 8 oz (59.2 kg)  11/15/21 126 lb (57.2 kg)    There is no height or weight on file to calculate BMI.  Performance status (ECOG): 1 - Symptomatic but completely ambulatory  PHYSICAL EXAM:  Physical Exam Constitutional:      General: She is not in acute distress.    Appearance: Normal appearance. She is normal weight.  HENT:     Head: Normocephalic and atraumatic.  Eyes:     General: No scleral icterus.    Extraocular Movements: Extraocular movements intact.     Conjunctiva/sclera: Conjunctivae normal.     Pupils: Pupils are equal, round, and reactive to light.  Cardiovascular:     Rate and Rhythm: Normal rate and regular rhythm.     Pulses: Normal  pulses.     Heart sounds: Normal heart sounds. No murmur heard.    No friction rub. No gallop.  Pulmonary:     Effort: Pulmonary effort is normal. No respiratory distress.     Breath sounds: Normal breath sounds.  Abdominal:     General: Bowel sounds are normal. There is no distension.     Palpations: Abdomen is soft. There is no hepatomegaly, splenomegaly or mass.     Tenderness: There is no abdominal tenderness.  Musculoskeletal:        General: Normal range of motion.     Cervical back: Normal range of motion and neck supple.     Right lower leg: No edema.     Left lower leg: No edema.  Lymphadenopathy:     Cervical: No cervical adenopathy.  Skin:    General: Skin is warm and dry.  Neurological:     Mental Status: She is alert and oriented to person, place, and time. Mental status is at baseline.      Comments: Right hemiparesis with right foot drop  Psychiatric:        Mood and Affect: Mood normal.        Behavior: Behavior normal.        Thought Content: Thought content normal.        Judgment: Judgment normal.    With a brace in place LABS:      Latest Ref Rng & Units 06/13/2021   12:00 AM 05/12/2020   12:00 AM 07/26/2019    4:02 AM  CBC  WBC  6.3     6.4     4.7   Hemoglobin 12.0 - 16.0 15.6     13.8     10.6   Hematocrit 36 - 46 47     40     33.9   Platelets 150 - 399 242     206     182      This result is from an external source.       Latest Ref Rng & Units 06/13/2021   12:00 AM 05/12/2020   12:00 AM 07/26/2019    4:02 AM  CMP  Glucose 70 - 99 mg/dL   84   BUN 4 - 21 12     15     14    Creatinine 0.5 - 1.1 0.8     0.8     0.58   Sodium 137 - 147 141     141     143   Potassium 3.4 - 5.3 4.1     3.8     3.5   Chloride 99 - 108 106     105     110   CO2 13 - 22 27     27     26    Calcium 8.7 - 10.7 9.3     9.7     9.1   Alkaline Phos 25 - 125 110     100       AST 13 - 35 24     27       ALT 7 - 35 21     19          This result is from an external source.      Lab Results  Component Value Date   CEA1 1.9 06/13/2021   /  CEA  Date Value Ref Range Status  06/13/2021 1.9 0.0 - 4.7 ng/mL Final  Comment:    (NOTE)                             Nonsmokers          <3.9                             Smokers             <5.6 [employer] Electrochemiluminescence Immunoassay (ECLIA) Values obtained with different assay methods or kits cannot be used interchangeably.  Results cannot be interpreted as absolute evidence of the presence or absence of malignant disease. Performed At: [facility] [place], [place] [id number] Rush Farmer MD DU:[id number]      STUDIES:  No results found.   Allergies:  Allergies  Allergen Reactions   Iodine-131 Anaphylaxis   Ivp Dye [Iodinated Contrast Media] Shortness Of Breath and Rash    Other Rash, Shortness Of Breath and Nausea And Vomiting    Other reaction(s): Respiratory Distress (ALLERGY/intolerance)   Morphine Itching   Pneumococcal Vaccine    Codeine Nausea And Vomiting and Itching   Influenza Vaccine Live Rash   Morphine And Related Rash   Pneumococcal Vaccines Rash   Shellfish Allergy Nausea And Vomiting    Current Medications: Current Outpatient Medications  Medication Sig Dispense Refill   acetaminophen (TYLENOL) 500 MG tablet Take 1,000 mg by mouth every 4 (four) hours as needed for moderate pain.      ascorbic acid (VITAMIN C) 500 MG tablet Take 500 mg by mouth 2 (two) times daily. (0900 & 2100)     aspirin 81 MG chewable tablet Chew 81 mg by mouth daily at 6 PM. (1700)     atorvastatin (LIPITOR) 80 MG tablet Take 1 tablet (80 mg total) by mouth daily at 6 PM. (Patient taking differently: Take 80 mg by mouth at bedtime. (2000)) 30 tablet 0   B Complex-C (B-COMPLEX WITH VITAMIN C) tablet Take 1 tablet by mouth daily.     cholecalciferol (VITAMIN D) 25 MCG (1000 UNIT) tablet Take 2,000 Units by mouth daily.     loperamide (IMODIUM) 2 MG capsule Take 1 capsule (2 mg total) by mouth as needed for diarrhea or loose stools. (Patient not taking: Reported on 10/15/2021) 120 capsule 0   ramipril (ALTACE) 5 MG capsule Take 1 capsule by mouth daily.     sertraline (ZOLOFT) 50 MG tablet Take 1 tablet by mouth daily.     No current facility-administered medications for this visit.     ASSESSMENT & PLAN:   Assessment:   1. Clinical stage IIA rectal cancer treated with neoadjuvant chemoradiation with excellent response.  She received adjuvant FOLFOX chemotherapy for 5 cycles.  We feel she still has a good prognosis. She has yet to undergo repeat colonoscopy and is now scheduled with Dr. Lyda Jester in March.  2. History of tobacco abuse, but is still smoking some now.  3. History of large stroke with persistent left hemiparesis.  She will likely need to have carotid  endarterectomy on the left side also eventually.   4. Colorectal cancer at early age.  Myriad myRisk Hereditary Cancer Panel did not reveal any clinically significant mutation or variant of uncertain significance.  5. B12 deficiency. Her B12 level is quite low at 156 despite oral supplement, so I do consider her to have pernicious anemia and explained that she will need to  stay on lifelong B12 injections.   Plan: She is now in a better living situation, and doing much better.  She continues to focus on gaining weight.  We will continue monthly B12 injections. We will see her back in 5-6 months with CBC, CMP, B12 and CEA. She is due for repeat colonoscopy, which is scheduled for March. She verbalizes understanding of an agreement to the plan today.  She knows to call the office should any new questions or concerns arise.   I provided 15 minutes of face-to-face time during this this encounter and > 50% was spent counseling as documented under my assessment and plan.   Derwood Kaplan, MD [facility] AT [facility] [address] [place] [address] Dept: [phone] Dept Fax: [phone]    Ninetta Lights as a scribe for Derwood Kaplan, MD.,have documented all relevant documentation on the behalf of Derwood Kaplan, MD,as directed by  Derwood Kaplan, MD while in the presence of Derwood Kaplan, MD.   I have reviewed this report as typed by the medical scribe, and it is complete and accurate.

## 2022-01-29 ENCOUNTER — Ambulatory Visit: Payer: Medicaid Other | Admitting: Oncology

## 2022-01-29 ENCOUNTER — Inpatient Hospital Stay: Payer: Medicaid Other

## 2022-02-12 ENCOUNTER — Ambulatory Visit: Payer: Medicaid Other

## 2022-02-13 ENCOUNTER — Inpatient Hospital Stay: Payer: Medicaid Other | Attending: Oncology

## 2022-02-13 VITALS — BP 109/66 | HR 86 | Temp 97.8°F | Resp 18 | Ht 67.0 in | Wt 133.0 lb

## 2022-02-13 DIAGNOSIS — Z79899 Other long term (current) drug therapy: Secondary | ICD-10-CM | POA: Diagnosis not present

## 2022-02-13 DIAGNOSIS — C20 Malignant neoplasm of rectum: Secondary | ICD-10-CM | POA: Insufficient documentation

## 2022-02-13 DIAGNOSIS — E538 Deficiency of other specified B group vitamins: Secondary | ICD-10-CM | POA: Insufficient documentation

## 2022-02-13 MED ORDER — CYANOCOBALAMIN 1000 MCG/ML IJ SOLN
1000.0000 ug | Freq: Once | INTRAMUSCULAR | Status: AC
  Administered 2022-02-13: 1000 ug via INTRAMUSCULAR
  Filled 2022-02-13: qty 1

## 2022-02-13 NOTE — Patient Instructions (Signed)
Vitamin B12 Deficiency Vitamin B12 deficiency occurs when the body does not have enough of this important vitamin. The body needs this vitamin: To make red blood cells. To make DNA. This is the genetic material inside cells. To help the nerves work properly so they can carry messages from the brain to the body. Vitamin B12 deficiency can cause health problems, such as not having enough red blood cells in the blood (anemia). This can lead to nerve damage if untreated. What are the causes? This condition may be caused by: Not eating enough foods that contain vitamin B12. Not having enough stomach acid and digestive fluids to properly absorb vitamin B12 from the food that you eat. Having certain diseases that make it hard to absorb vitamin B12. These diseases include Crohn's disease, chronic pancreatitis, and cystic fibrosis. An autoimmune disorder in which the body does not make enough of a protein (intrinsic factor) within the stomach, resulting in not enough absorption of vitamin B12. Having a surgery in which part of the stomach or small intestine is removed. Taking certain medicines that make it hard for the body to absorb vitamin B12. These include: Heartburn medicines, such as antacids and proton pump inhibitors. Some medicines that are used to treat diabetes. What increases the risk? The following factors may make you more likely to develop a vitamin B12 deficiency: Being an older adult. Eating a vegetarian or vegan diet that does not include any foods that come from animals. Eating a poor diet while you are pregnant. Taking certain medicines. Having alcoholism. What are the signs or symptoms? In some cases, there are no symptoms of this condition. If the condition leads to anemia or nerve damage, various symptoms may occur, such as: Weakness. Tiredness (fatigue). Loss of appetite. Numbness or tingling in your hands and feet. Redness and burning of the tongue. Depression,  confusion, or memory problems. Trouble walking. If anemia is severe, symptoms can include: Shortness of breath. Dizziness. Rapid heart rate. How is this diagnosed? This condition may be diagnosed with a blood test to measure the level of vitamin B12 in your blood. You may also have other tests, including: A group of tests that measure certain characteristics of blood cells (complete blood count, CBC). A blood test to measure intrinsic factor. A procedure where a thin tube with a camera on the end is used to look into your stomach or intestines (endoscopy). Other tests may be needed to discover the cause of the deficiency. How is this treated? Treatment for this condition depends on the cause. This condition may be treated by: Changing your eating and drinking habits, such as: Eating more foods that contain vitamin B12. Drinking less alcohol or no alcohol. Getting vitamin B12 injections. Taking vitamin B12 supplements by mouth (orally). Your health care provider will tell you which dose is best for you. Follow these instructions at home: Eating and drinking  Include foods in your diet that come from animals and contain a lot of vitamin B12. These include: Meats and poultry. This includes beef, pork, chicken, turkey, and organ meats, such as liver. Seafood. This includes clams, rainbow trout, salmon, tuna, and haddock. Eggs. Dairy foods such as milk, yogurt, and cheese. Eat foods that have vitamin B12 added to them (are fortified), such as ready-to-eat breakfast cereals. Check the label on the package to see if a food is fortified. The items listed above may not be a complete list of foods and beverages you can eat and drink. Contact a dietitian for   more information. Alcohol use Do not drink alcohol if: Your health care provider tells you not to drink. You are pregnant, may be pregnant, or are planning to become pregnant. If you drink alcohol: Limit how much you have to: 0-1 drink a  day for women. 0-2 drinks a day for men. Know how much alcohol is in your drink. In the [place] one drink equals one 12 oz bottle of beer (355 mL), one 5 oz glass of wine (148 mL), or one 1 oz glass of hard liquor (44 mL). General instructions Get vitamin B12 injections if told to by your health care provider. Take supplements only as told by your health care provider. Follow the directions carefully. Keep all follow-up visits. This is important. Contact a health care provider if: Your symptoms come back. Your symptoms get worse or do not improve with treatment. Get help right away: You develop shortness of breath. You have a rapid heart rate. You have chest pain. You become dizzy or you faint. These symptoms may be an emergency. Get help right away. Call 911. Do not wait to see if the symptoms will go away. Do not drive yourself to the hospital. Summary Vitamin B12 deficiency occurs when the body does not have enough of this important vitamin. Common causes include not eating enough foods that contain vitamin B12, not being able to absorb vitamin B12 from the food that you eat, having a surgery in which part of the stomach or small intestine is removed, or taking certain medicines. Eat foods that have vitamin B12 in them. Treatment may include making a change in the way you eat and drink, getting vitamin B12 injections, or taking vitamin B12 supplements. This information is not intended to replace advice given to you by your health care provider. Make sure you discuss any questions you have with your health care provider. Document Revised: 01/19/2021 Document Reviewed: 01/19/2021 Elsevier Patient Education  2023 [employer]  

## 2022-02-21 NOTE — Progress Notes (Signed)
[facility]  [address] [place],  [place]  [address] [phone]  Clinic Day:  02/22/22  Referring physician: Maris Berger, MD   CHIEF COMPLAINT:  CC: Stage IIA adenocarcinoma of the rectum  Current Treatment:  Surveillance   HISTORY OF PRESENT ILLNESS:  Jill Williams is a 51 y.o. 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  She presented with intermittent rectal bleeding as well as chronic diarrhea for at least 6 months.  Colonoscopy revealed a mass in the right posterior wall about 3 cm from the anal verge extending to 8 cm above the anal verge, encompassing more than half of the mucosal wall.  Biopsy revealed invasive adenocarcinoma.  Neoadjuvant chemoradiation was recommended, to be followed by adjuvant FOLFOX postop.  CT imaging revealed a 6 cm annual lower rectal mass with a small right perirectal node measuring 5 mm and a left perirectal node measuring 1.8 cm.  There was a 1 cm calcified right liver lesion felt to be benign.  There were scattered subcentimeter bilateral lung nodules, the largest measuring 6 mm.  PET revealed an extensive hypermetabolic rectal mass with an SUV of 12.5.  The left pelvic sidewall lymph node had decreased to 6.5 mm and was not hypermetabolic.  The right 5 mm perirectal lymph node was not hypermetabolic.  The lung nodules were not hypermetabolic, but continued follow-up was recommended.  She received neoadjuvant chemoradiation with infusional 5 fluorouracil week 1 and week 5 and started treatment on March 16th.  She had severe chronic diarrhea, but continued daily radiation.   She has a history of a stroke in 2015 which has left her with left upper extremity paraplegia and left lower extremity weakness and foot drop.  She requires a brace for her left ankle.  She is status post right endarterectomy.  She also has hypertension, hyperlipidemia, peripheral vascular  disease, osteoarthritis, gastroesophageal reflux disease, and COPD.  Chest x-ray done December 30th clearly showed hyperinflation.  Unfortunately, she continues to smoke.  She has not had further menstrual periods since her stroke. She will eventually require a left carotid endarterectomy.  Due to her having rectal cancer at age 6, she met criteria for testing for hereditary colorectal cancer, but she initially declined.  Review of her family history reveals her father had lung cancer at an unknown age, a maternal aunt had breast cancer at age 64, her maternal grandmother had an unknown cancer at an unknown age and a paternal aunt had breast cancer at an unknown age.  Myriad myRisk Hereditary Cancer Panel test did not reveal any clinically significant mutation or variant of uncertain significance.  Her lifetime breast cancer risk was estimated at 7.8%.  She underwent surgical resection in July 2020 and was found to have an excellent response to neoadjuvant chemoradiation.  Pathology revealed scant residual foci of adenocarcinoma with the largest foci measuring 0.4 cm.  Eleven lymph nodes were negative for metastasis.  She has had a lot of difficulty with the colostomy bags and ended up in a rehab facility during her postop chemotherapy.  She was recommended for adjuvant FOLFOX chemotherapy.  Her son died in 03-10-2023 in a shooting accident and she stopped eating and drinking for 2 weeks.  She was then admitted to the hospital with a sodium of 166 and was in for several days of IV fluids.  We felt this was from dehydration and malnutrition.  MRI brain, did  not reveal acute findings, the massive prior stroke with encephalomalacia was seen.  She had been deteriorating with weight loss, anorexia and depression.  She finally agreed to take trazodone 75 mg daily.  She was severely weak.  We discontinued chemotherapy after 5 cycles due to her performance status.  However, her long term prognosis is still good.  She is  on  B12 supplementation.  She underwent colostomy reversal on January 21st at [facility] with Dr. Leighton Ruff.    INTERVAL HISTORY:  Jill Williams is here for follow up and doing very well. Her appetite is good and her weight is stable. She is on monthly B12 injections for B12 deficiency. I think she has true pernicious anemia, I.e. a lack of intrinsic factor. She had her colonoscopy this year with Dr. Lyda Jester, and was found to have diverticulosis. She has a much better living situation now and is in great spirits. CEA from last time was normal.  She denies fever, chills or other signs of infection.  She denies nausea, vomiting, bowel issues, or abdominal pain.  She denies sore throat, cough, dyspnea, or chest pain.  REVIEW OF SYSTEMS:  Review of Systems  Constitutional: Negative.  Negative for appetite change, chills, fatigue, fever and unexpected weight change.  HENT:  Negative.    Eyes: Negative.   Respiratory: Negative.  Negative for chest tightness, cough, hemoptysis, shortness of breath and wheezing.   Cardiovascular: Negative.  Negative for chest pain, leg swelling and palpitations.  Gastrointestinal: Negative.  Negative for abdominal distention, abdominal pain, blood in stool, constipation, diarrhea, nausea and vomiting.  Endocrine: Negative.   Genitourinary: Negative.  Negative for difficulty urinating, dysuria, frequency and hematuria.   Musculoskeletal:  Negative for arthralgias, back pain, flank pain, gait problem and myalgias.  Skin: Negative.   Neurological:  Positive for extremity weakness (chronic of the right side from prior stroke). Negative for dizziness, gait problem, headaches, light-headedness, numbness, seizures and speech difficulty.  Hematological: Negative.   Psychiatric/Behavioral: Negative.  Negative for depression and sleep disturbance. The patient is not nervous/anxious.      VITALS:  Blood pressure (!) 151/71, pulse 67, temperature 97.9 F (36.6 C), temperature  source Oral, resp. rate 18, height 5' 7"  (1.702 m), weight 132 lb 14.4 oz (60.3 kg), last menstrual period 07/18/2014, SpO2 99 %.  Wt Readings from Last 3 Encounters:  02/22/22 132 lb 14.4 oz (60.3 kg)  02/13/22 133 lb (60.3 kg)  01/16/22 132 lb (59.9 kg)    Body mass index is 20.82 kg/m.  Performance status (ECOG): 1 - Symptomatic but completely ambulatory  PHYSICAL EXAM:  Physical Exam Constitutional:      General: She is not in acute distress.    Appearance: Normal appearance. She is normal weight.  HENT:     Head: Normocephalic and atraumatic.  Eyes:     General: No scleral icterus.    Extraocular Movements: Extraocular movements intact.     Conjunctiva/sclera: Conjunctivae normal.     Pupils: Pupils are equal, round, and reactive to light.  Cardiovascular:     Rate and Rhythm: Normal rate and regular rhythm.     Pulses: Normal pulses.     Heart sounds: Normal heart sounds. No murmur heard.    No friction rub. No gallop.  Pulmonary:     Effort: Pulmonary effort is normal. No respiratory distress.     Breath sounds: Normal breath sounds.  Abdominal:     General: Bowel sounds are normal. There is no distension.  Palpations: Abdomen is soft. There is no hepatomegaly, splenomegaly or mass.     Tenderness: There is no abdominal tenderness.  Musculoskeletal:        General: Normal range of motion.     Cervical back: Normal range of motion and neck supple.     Right lower leg: No edema.     Left lower leg: No edema.  Lymphadenopathy:     Cervical: No cervical adenopathy.  Skin:    General: Skin is warm and dry.  Neurological:     Mental Status: She is alert and oriented to person, place, and time. Mental status is at baseline.     Comments: Right hemiparesis with right foot drop  Psychiatric:        Mood and Affect: Mood normal.        Behavior: Behavior normal.        Thought Content: Thought content normal.        Judgment: Judgment normal.    With a brace in  place LABS:      Latest Ref Rng & Units 02/22/2022   12:00 AM 06/13/2021   12:00 AM 05/12/2020   12:00 AM  CBC  WBC  7.6     6.3     6.4      Hemoglobin 12.0 - 16.0 15.0     15.6     13.8      Hematocrit 36 - 46 45     47     40      Platelets 150 - 400 K/uL 234     242     206         This result is from an external source.      Latest Ref Rng & Units 02/22/2022   12:00 AM 06/13/2021   12:00 AM 05/12/2020   12:00 AM  CMP  BUN 4 - 21 8     12     15       Creatinine 0.5 - 1.1 0.9     0.8     0.8      Sodium 137 - 147 143     141     141      Potassium 3.5 - 5.1 mEq/L 3.7     4.1     3.8      Chloride 99 - 108 108     106     105      CO2 13 - 22 26     27     27       Calcium 8.7 - 10.7 9.4     9.3     9.7      Alkaline Phos 25 - 125 143     110     100      AST 13 - 35 33     24     27      ALT 7 - 35 U/L 41     21     19         This result is from an external source.     Lab Results  Component Value Date   CEA1 1.9 02/22/2022   /  CEA  Date Value Ref Range Status  02/22/2022 1.9 0.0 - 4.7 ng/mL Final    Comment:    (NOTE)  Nonsmokers          <3.9                             Smokers             <5.6 Roche [employer] Electrochemiluminescence Immunoassay (ECLIA) Values obtained with different assay methods or kits cannot be used interchangeably.  Results cannot be interpreted as absolute evidence of the presence or absence of malignant disease. Performed At: [facility] [place], [place] [id number] Rush Farmer MD UY:[id number]      STUDIES:  No results found.   Allergies:  Allergies  Allergen Reactions   Iodine-131 Anaphylaxis   Ivp Dye [Iodinated Contrast Media] Shortness Of Breath and Rash   Other Rash, Shortness Of Breath and Nausea And Vomiting    Other reaction(s): Respiratory Distress (ALLERGY/intolerance)   Morphine Itching   Pneumococcal Vaccine    Codeine Nausea And Vomiting and Itching    Influenza Vaccine Live Rash   Morphine And Related Rash   Pneumococcal Vaccines Rash   Shellfish Allergy Nausea And Vomiting    Current Medications: Current Outpatient Medications  Medication Sig Dispense Refill   acetaminophen (TYLENOL) 500 MG tablet Take 1,000 mg by mouth every 4 (four) hours as needed for moderate pain.      ascorbic acid (VITAMIN C) 500 MG tablet Take 500 mg by mouth 2 (two) times daily. (0900 & 2100)     aspirin 81 MG chewable tablet Chew 81 mg by mouth daily at 6 PM. (1700)     atorvastatin (LIPITOR) 80 MG tablet Take 1 tablet (80 mg total) by mouth daily at 6 PM. (Patient taking differently: Take 80 mg by mouth at bedtime. (2000)) 30 tablet 0   B Complex-C (B-COMPLEX WITH VITAMIN C) tablet Take 1 tablet by mouth daily.     cholecalciferol (VITAMIN D) 25 MCG (1000 UNIT) tablet Take 2,000 Units by mouth daily.     FIBER ADULT GUMMIES PO Take 5 mg by mouth in the morning.     loperamide (IMODIUM) 2 MG capsule Take 1 capsule (2 mg total) by mouth as needed for diarrhea or loose stools. 120 capsule 0   ramipril (ALTACE) 5 MG capsule Take 1 capsule by mouth daily.     sertraline (ZOLOFT) 50 MG tablet Take 1 tablet by mouth daily.     No current facility-administered medications for this visit.     ASSESSMENT & PLAN:   Assessment:   1. Clinical stage IIA rectal cancer treated with neoadjuvant chemoradiation with excellent response.  She received adjuvant FOLFOX chemotherapy for 5 cycles.  We feel she still has a good prognosis. She has yet to undergo repeat colonoscopy and is now scheduled with Dr. Lyda Jester in March.  2. History of tobacco abuse, but is still smoking some now.  3. History of large stroke with persistent left hemiparesis.  She will likely need to have carotid endarterectomy on the left side also eventually.   4. Colorectal cancer at early age.  Myriad myRisk Hereditary Cancer Panel did not reveal any clinically significant mutation or variant of  uncertain significance.  5. B12 deficiency. Her B12 level is quite low at 156 despite oral supplement, so I do consider her to have pernicious anemia and explained that she will need to stay on lifelong B12 injections.   Plan: She is now in a better living situation, and doing much better.  She continues  to maintain her weight.  We will continue monthly B12 injections. We will see her back in 6 months with CBC, CMP and CEA. She had her colonoscopy this year. She verbalizes understanding of an agreement to the plan today.  She knows to call the office should any new questions or concerns arise.   I provided 15 minutes of face-to-face time during this this encounter and > 50% was spent counseling as documented under my assessment and plan.   Derwood Kaplan, MD [facility] [address] [place] [address] Dept: [phone] Dept Fax: [phone]

## 2022-02-22 ENCOUNTER — Inpatient Hospital Stay: Payer: Medicaid Other

## 2022-02-22 ENCOUNTER — Other Ambulatory Visit: Payer: Self-pay | Admitting: Oncology

## 2022-02-22 ENCOUNTER — Encounter: Payer: Self-pay | Admitting: Oncology

## 2022-02-22 ENCOUNTER — Inpatient Hospital Stay (INDEPENDENT_AMBULATORY_CARE_PROVIDER_SITE_OTHER): Payer: Medicaid Other | Admitting: Oncology

## 2022-02-22 ENCOUNTER — Telehealth: Payer: Self-pay | Admitting: Oncology

## 2022-02-22 VITALS — BP 151/71 | HR 67 | Temp 97.9°F | Resp 18 | Ht 67.0 in | Wt 132.9 lb

## 2022-02-22 DIAGNOSIS — C20 Malignant neoplasm of rectum: Secondary | ICD-10-CM

## 2022-02-22 DIAGNOSIS — E538 Deficiency of other specified B group vitamins: Secondary | ICD-10-CM | POA: Diagnosis not present

## 2022-02-22 LAB — BASIC METABOLIC PANEL
BUN: 8 (ref 4–21)
CO2: 26 — AB (ref 13–22)
Chloride: 108 (ref 99–108)
Creatinine: 0.9 (ref 0.5–1.1)
Glucose: 95
Potassium: 3.7 mEq/L (ref 3.5–5.1)
Sodium: 143 (ref 137–147)

## 2022-02-22 LAB — COMPREHENSIVE METABOLIC PANEL
Albumin: 4.4 (ref 3.5–5.0)
Calcium: 9.4 (ref 8.7–10.7)

## 2022-02-22 LAB — HEPATIC FUNCTION PANEL
ALT: 41 U/L — AB (ref 7–35)
AST: 33 (ref 13–35)
Alkaline Phosphatase: 143 — AB (ref 25–125)
Bilirubin, Total: 0.6

## 2022-02-22 LAB — CBC AND DIFFERENTIAL
HCT: 45 (ref 36–46)
Hemoglobin: 15 (ref 12.0–16.0)
Neutrophils Absolute: 5.7
Platelets: 234 10*3/uL (ref 150–400)
WBC: 7.6

## 2022-02-22 LAB — CBC: RBC: 4.94 (ref 3.87–5.11)

## 2022-02-22 LAB — VITAMIN B12: Vitamin B-12: 427 pg/mL (ref 180–914)

## 2022-02-22 NOTE — Telephone Encounter (Signed)
02/22/22 NEXT APPT SCHEDULED AND CONFIRMED WITH PATIENT

## 2022-02-23 LAB — CEA: CEA: 1.9 ng/mL (ref 0.0–4.7)

## 2022-03-13 ENCOUNTER — Inpatient Hospital Stay: Payer: Medicaid Other

## 2022-03-13 ENCOUNTER — Encounter: Payer: Self-pay | Admitting: Oncology

## 2022-03-14 ENCOUNTER — Ambulatory Visit: Payer: Medicaid Other

## 2022-03-19 ENCOUNTER — Encounter: Payer: Self-pay | Admitting: Oncology

## 2022-03-19 NOTE — Addendum Note (Signed)
Addended by: Juanetta Beets on: 03/19/2022 11:23 AM   Modules accepted: Orders

## 2022-03-20 ENCOUNTER — Inpatient Hospital Stay: Payer: Medicaid Other | Attending: Oncology

## 2022-03-20 VITALS — BP 114/90 | HR 100 | Temp 97.7°F | Resp 18 | Ht 67.0 in | Wt 133.8 lb

## 2022-03-20 DIAGNOSIS — E538 Deficiency of other specified B group vitamins: Secondary | ICD-10-CM | POA: Diagnosis present

## 2022-03-20 MED ORDER — CYANOCOBALAMIN 1000 MCG/ML IJ SOLN
1000.0000 ug | Freq: Once | INTRAMUSCULAR | Status: AC
  Administered 2022-03-20: 1000 ug via INTRAMUSCULAR
  Filled 2022-03-20: qty 1

## 2022-03-20 NOTE — Patient Instructions (Signed)
Vitamin B12 Injection What is this medication? Vitamin B12 (VAHY tuh min B12) prevents and treats low vitamin B12 levels in your body. It is used in people who do not get enough vitamin B12 from their diet or when their digestive tract does not absorb enough. Vitamin B12 plays an important role in maintaining the health of your nervous system and red blood cells. This medicine may be used for other purposes; ask your health care provider or pharmacist if you have questions. COMMON BRAND NAME(S): B-12 Compliance Kit, B-12 Injection Kit, Cyomin, Dodex, LA-12, Nutri-Twelve, Physicians EZ Use B-12, Primabalt What should I tell my care team before I take this medication? They need to know if you have any of these conditions: Kidney disease Leber's disease Megaloblastic anemia An unusual or allergic reaction to cyanocobalamin, cobalt, other medications, foods, dyes, or preservatives Pregnant or trying to get pregnant Breast-feeding How should I use this medication? This medication is injected into a muscle or deeply under the skin. It is usually given in a clinic or care team's office. However, your care team may teach you how to inject yourself. Follow all instructions. Talk to your care team about the use of this medication in children. Special care may be needed. Overdosage: If you think you have taken too much of this medicine contact a poison control center or emergency room at once. NOTE: This medicine is only for you. Do not share this medicine with others. What if I miss a dose? If you are given your dose at a clinic or care team's office, call to reschedule your appointment. If you give your own injections, and you miss a dose, take it as soon as you can. If it is almost time for your next dose, take only that dose. Do not take double or extra doses. What may interact with this medication? Alcohol Colchicine This list may not describe all possible interactions. Give your health care  provider a list of all the medicines, herbs, non-prescription drugs, or dietary supplements you use. Also tell them if you smoke, drink alcohol, or use illegal drugs. Some items may interact with your medicine. What should I watch for while using this medication? Visit your care team regularly. You may need blood work done while you are taking this medication. You may need to follow a special diet. Talk to your care team. Limit your alcohol intake and avoid smoking to get the best benefit. What side effects may I notice from receiving this medication? Side effects that you should report to your care team as soon as possible: Allergic reactions--skin rash, itching, hives, swelling of the face, lips, tongue, or throat Swelling of the ankles, hands, or feet Trouble breathing Side effects that usually do not require medical attention (report to your care team if they continue or are bothersome): Diarrhea This list may not describe all possible side effects. Call your doctor for medical advice about side effects. You may report side effects to FDA at 1-800-FDA-1088. Where should I keep my medication? Keep out of the reach of children. Store at room temperature between 15 and 30 degrees C (59 and 85 degrees F). Protect from light. Throw away any unused medication after the expiration date. NOTE: This sheet is a summary. It may not cover all possible information. If you have questions about this medicine, talk to your doctor, pharmacist, or health care provider.  2023 Elsevier/Gold Standard (2020-08-03 00:00:00)

## 2022-04-10 ENCOUNTER — Inpatient Hospital Stay: Payer: Medicaid Other | Attending: Oncology

## 2022-04-10 VITALS — BP 158/89 | HR 83 | Temp 98.0°F | Resp 18 | Ht 67.0 in | Wt 137.2 lb

## 2022-04-10 DIAGNOSIS — E538 Deficiency of other specified B group vitamins: Secondary | ICD-10-CM | POA: Insufficient documentation

## 2022-04-10 MED ORDER — CYANOCOBALAMIN 1000 MCG/ML IJ SOLN
1000.0000 ug | Freq: Once | INTRAMUSCULAR | Status: AC
  Administered 2022-04-10: 1000 ug via INTRAMUSCULAR
  Filled 2022-04-10: qty 1

## 2022-04-10 NOTE — Patient Instructions (Signed)

## 2022-04-15 ENCOUNTER — Ambulatory Visit: Payer: Medicaid Other

## 2022-05-07 ENCOUNTER — Encounter: Payer: Self-pay | Admitting: Oncology

## 2022-05-07 NOTE — Addendum Note (Signed)
Addended by: Juanetta Beets on: 05/07/2022 03:14 PM   Modules accepted: Orders

## 2022-05-08 ENCOUNTER — Inpatient Hospital Stay: Payer: Medicaid Other

## 2022-05-08 VITALS — BP 118/64 | HR 81 | Temp 98.1°F | Resp 18 | Ht 67.0 in | Wt 139.0 lb

## 2022-05-08 DIAGNOSIS — E538 Deficiency of other specified B group vitamins: Secondary | ICD-10-CM | POA: Diagnosis not present

## 2022-05-08 MED ORDER — CYANOCOBALAMIN 1000 MCG/ML IJ SOLN
1000.0000 ug | Freq: Once | INTRAMUSCULAR | Status: AC
  Administered 2022-05-08: 1000 ug via INTRAMUSCULAR
  Filled 2022-05-08: qty 1

## 2022-05-08 NOTE — Patient Instructions (Signed)

## 2022-05-15 ENCOUNTER — Ambulatory Visit: Payer: Medicaid Other

## 2022-06-05 ENCOUNTER — Inpatient Hospital Stay: Payer: Medicaid Other | Attending: Oncology

## 2022-06-05 VITALS — BP 122/65 | HR 112 | Temp 98.5°F | Resp 20 | Ht 67.0 in | Wt 144.1 lb

## 2022-06-05 DIAGNOSIS — E538 Deficiency of other specified B group vitamins: Secondary | ICD-10-CM | POA: Insufficient documentation

## 2022-06-05 MED ORDER — CYANOCOBALAMIN 1000 MCG/ML IJ SOLN
1000.0000 ug | Freq: Once | INTRAMUSCULAR | Status: AC
  Administered 2022-06-05: 1000 ug via INTRAMUSCULAR
  Filled 2022-06-05: qty 1

## 2022-06-05 NOTE — Patient Instructions (Signed)

## 2022-07-08 ENCOUNTER — Inpatient Hospital Stay: Payer: Medicaid Other | Attending: Oncology

## 2022-07-08 VITALS — BP 128/86 | HR 59 | Temp 98.0°F | Resp 18 | Ht 67.0 in | Wt 143.0 lb

## 2022-07-08 DIAGNOSIS — E538 Deficiency of other specified B group vitamins: Secondary | ICD-10-CM

## 2022-07-08 MED ORDER — CYANOCOBALAMIN 1000 MCG/ML IJ SOLN
1000.0000 ug | Freq: Once | INTRAMUSCULAR | Status: AC
  Administered 2022-07-08: 1000 ug via INTRAMUSCULAR
  Filled 2022-07-08: qty 1

## 2022-07-08 NOTE — Patient Instructions (Signed)
Vitamin B12 Deficiency Vitamin B12 deficiency means that your body does not have enough vitamin B12. The body needs this important vitamin: To make red blood cells. To make genes (DNA). To help the nerves work. If you do not have enough vitamin B12 in your body, you can have health problems, such as not having enough red blood cells in the blood (anemia). What are the causes? Not eating enough foods that contain vitamin B12. Not being able to take in (absorb) vitamin B12 from the food that you eat. Certain diseases. A condition in which the body does not make enough of a certain protein. This results in your body not taking in enough vitamin B12. Having a surgery in which part of the stomach or small intestine is taken out. Taking medicines that make it hard for the body to take in vitamin B12. These include: Heartburn medicines. Some medicines that are used to treat diabetes. What increases the risk? Being an older adult. Eating a vegetarian or vegan diet that does not include any foods that come from animals. Not eating enough foods that contain vitamin B12 while you are pregnant. Taking certain medicines. Having alcoholism. What are the signs or symptoms? In some cases, there are no symptoms. If the condition leads to too few blood cells or nerve damage, symptoms can occur, such as: Feeling weak or tired. Not being hungry. Losing feeling (numbness) or tingling in your hands and feet. Redness and burning of the tongue. Feeling sad (depressed). Confusion or memory problems. Trouble walking. If anemia is very bad, symptoms can include: Being short of breath. Being dizzy. Having a very fast heartbeat. How is this treated? Changing the way you eat and drink, such as: Eating more foods that contain vitamin B12. Drinking little or no alcohol. Getting vitamin B12 shots. Taking vitamin B12 supplements by mouth (orally). Your doctor will tell you the dose that is best for you. Follow  these instructions at home: Eating and drinking  Eat foods that come from animals and have a lot of vitamin B12 in them. These include: Meats and poultry. This includes beef, pork, chicken, turkey, and organ meats, such as liver. Seafood, such as clams, rainbow trout, salmon, tuna, and haddock. Eggs. Dairy foods such as milk, yogurt, and cheese. Eat breakfast cereals that have vitamin B12 added to them (are fortified). Check the label. The items listed above may not be a complete list of foods and beverages you can eat and drink. Contact a dietitian for more information. Alcohol use Do not drink alcohol if: Your doctor tells you not to drink. You are pregnant, may be pregnant, or are planning to become pregnant. If you drink alcohol: Limit how much you have to: 0-1 drink a day for women. 0-2 drinks a day for men. Know how much alcohol is in your drink. In the [place] one drink equals one 12 oz bottle of beer (355 mL), one 5 oz glass of wine (148 mL), or one 1 oz glass of hard liquor (44 mL). General instructions Get any vitamin B12 shots if told by your doctor. Take supplements only as told by your doctor. Follow the directions. Keep all follow-up visits. Contact a doctor if: Your symptoms come back. Your symptoms get worse or do not get better with treatment. Get help right away if: You have trouble breathing. You have a very fast heartbeat. You have chest pain. You get dizzy. You faint. These symptoms may be an emergency. Get help right away. Call 911.   Do not wait to see if the symptoms will go away. Do not drive yourself to the hospital. Summary Vitamin B12 deficiency means that your body is not getting enough of the vitamin. In some cases, there are no symptoms of this condition. Treatment may include making a change in the way you eat and drink, getting shots, or taking supplements. Eat foods that have vitamin B12 in them. This information is not intended to replace advice  given to you by your health care provider. Make sure you discuss any questions you have with your health care provider. Document Revised: 01/19/2021 Document Reviewed: 01/19/2021 Elsevier Patient Education  2023 Elsevier Inc.  

## 2022-08-08 ENCOUNTER — Inpatient Hospital Stay: Payer: Medicaid Other | Attending: Oncology

## 2022-08-08 VITALS — BP 152/70 | HR 90 | Temp 98.8°F | Resp 20 | Ht 67.0 in | Wt 147.0 lb

## 2022-08-08 DIAGNOSIS — E538 Deficiency of other specified B group vitamins: Secondary | ICD-10-CM | POA: Diagnosis present

## 2022-08-08 MED ORDER — CYANOCOBALAMIN 1000 MCG/ML IJ SOLN
1000.0000 ug | Freq: Once | INTRAMUSCULAR | Status: AC
  Administered 2022-08-08: 1000 ug via INTRAMUSCULAR
  Filled 2022-08-08: qty 1

## 2022-08-08 NOTE — Patient Instructions (Signed)

## 2022-08-21 NOTE — Progress Notes (Signed)
[facility]  [address] [place],  [place]  [address] [phone]  Clinic Day: 08/23/2022   Referring physician: Everlean Cherry, MD   CHIEF COMPLAINT:  CC: Stage IIA adenocarcinoma of the rectum  Current Treatment:  Surveillance   HISTORY OF PRESENT ILLNESS:  Jill Williams is a 52 y.o. 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  She presented with intermittent rectal bleeding as well as chronic diarrhea for at least 6 months.  Colonoscopy revealed a mass in the right posterior wall about 3 cm from the anal verge extending to 8 cm above the anal verge, encompassing more than half of the mucosal wall.  Biopsy revealed invasive adenocarcinoma.  Neoadjuvant chemoradiation was recommended, to be followed by adjuvant FOLFOX postop.  CT imaging revealed a 6 cm annual lower rectal mass with a small right perirectal node measuring 5 mm and a left perirectal node measuring 1.8 cm.  There was a 1 cm calcified right liver lesion felt to be benign.  There were scattered subcentimeter bilateral lung nodules, the largest measuring 6 mm.  PET revealed an extensive hypermetabolic rectal mass with an SUV of 12.5.  The left pelvic sidewall lymph node had decreased to 6.5 mm and was not hypermetabolic.  The right 5 mm perirectal lymph node was not hypermetabolic.  The lung nodules were not hypermetabolic, but continued follow-up was recommended.  She received neoadjuvant chemoradiation with infusional 5 fluorouracil week 1 and week 5 and started treatment on March 16th.  She had severe chronic diarrhea, but continued daily radiation.   She has a history of a stroke in 2015 which has left her with left upper extremity paraplegia and left lower extremity weakness and foot drop.  She requires a brace for her left ankle.  She is status post right endarterectomy.  She also has hypertension, hyperlipidemia, peripheral  vascular disease, osteoarthritis, gastroesophageal reflux disease, and COPD.  Chest x-ray done December 30th clearly showed hyperinflation.  Unfortunately, she continues to smoke.  She has not had further menstrual periods since her stroke. She will eventually require a left carotid endarterectomy.  Due to her having rectal cancer at age 7, she met criteria for testing for hereditary colorectal cancer, but she initially declined.  Review of her family history reveals her father had lung cancer at an unknown age, a maternal aunt had breast cancer at age 9, her maternal grandmother had an unknown cancer at an unknown age and a paternal aunt had breast cancer at an unknown age.  Myriad myRisk Hereditary Cancer Panel test did not reveal any clinically significant mutation or variant of uncertain significance.  Her lifetime breast cancer risk was estimated at 7.8%.  She underwent surgical resection in July 2020 and was found to have an excellent response to neoadjuvant chemoradiation.  Pathology revealed scant residual foci of adenocarcinoma with the largest foci measuring 0.4 cm.  Eleven lymph nodes were negative for metastasis.  She has had a lot of difficulty with the colostomy bags and ended up in a rehab facility during her postop chemotherapy.  She was recommended for adjuvant FOLFOX chemotherapy.  Her son died in 03-01-2023 in a shooting accident and she stopped eating and drinking for 2 weeks.  She was then admitted to the hospital with a sodium of 166 and was in for several days of IV fluids.  We felt this was from dehydration and malnutrition.  MRI brain, did  not reveal acute findings, the massive prior stroke with encephalomalacia was seen.  She had been deteriorating with weight loss, anorexia and depression.  She finally agreed to take trazodone 75 mg daily.  She was severely weak.  We discontinued chemotherapy after 5 cycles due to her performance status.  However, her long term prognosis is still good.   She is on  B12 supplementation.  She underwent colostomy reversal on January 21st at [facility] with Dr. Romie Levee.    INTERVAL HISTORY:  Jill Williams is here for follow up for her stage IIA adenocarcinoma of the rectum. She was diagnosed in January, 2020.  Patient states that she feels fine and has no complaints. Patient continues to smoke 7 cigarettes a day and had a CT scan performed on her 08/21/2022. She has been coughing up a green phlegm with no pain or fever. She continues her B-12 injections monthly without complications. She reminded me she had a cholecystectomy at the end of 2020. She had her colonoscopy in 2023 that revealed Diverticulitis. I will see her back in 6 months with CBC, CMP, and CEA. Dr. Martha Clan continues to schedule her annual mammograms and the last one in April, 2023 was clear. She denies signs of infection such as sore throat, sinus drainage, cough, or urinary symptoms.  She denies fevers or recurrent chills. She denies pain. She denies nausea, vomiting, chest pain, dyspnea or cough. Her appetite is great and her weight has increased 5 pounds over last 1.5 months .    REVIEW OF SYSTEMS:  Review of Systems  Constitutional: Negative.  Negative for appetite change, chills, diaphoresis, fatigue, fever and unexpected weight change.  HENT:  Negative.  Negative for hearing loss, lump/mass, mouth sores, nosebleeds, sore throat, tinnitus, trouble swallowing and voice change.   Eyes: Negative.  Negative for eye problems and icterus.  Respiratory:  Positive for cough (green phlegm). Negative for chest tightness, hemoptysis, shortness of breath and wheezing.   Cardiovascular: Negative.  Negative for chest pain, leg swelling and palpitations.  Gastrointestinal: Negative.  Negative for abdominal distention, abdominal pain, blood in stool, constipation, diarrhea, nausea, rectal pain and vomiting.  Endocrine: Negative.   Genitourinary: Negative.  Negative for bladder incontinence, difficulty  urinating, dyspareunia, dysuria, frequency, hematuria, menstrual problem, nocturia, pelvic pain, vaginal bleeding and vaginal discharge.   Musculoskeletal:  Negative for arthralgias, back pain, flank pain, gait problem, myalgias, neck pain and neck stiffness.  Skin: Negative.  Negative for itching, rash and wound.  Neurological:  Positive for extremity weakness (chronic of the right side from prior stroke). Negative for dizziness, gait problem, headaches, light-headedness, numbness, seizures and speech difficulty.  Hematological: Negative.  Negative for adenopathy. Does not bruise/bleed easily.  Psychiatric/Behavioral: Negative.  Negative for confusion, decreased concentration, depression, sleep disturbance and suicidal ideas. The patient is not nervous/anxious.     VITALS:  Blood pressure 105/61, pulse 79, temperature 98.1 F (36.7 C), temperature source Oral, resp. rate 17, height 5\' 7"  (1.702 m), weight 148 lb 9.6 oz (67.4 kg), last menstrual period 07/18/2014, SpO2 99 %.  Wt Readings from Last 3 Encounters:  09/06/22 146 lb 1.9 oz (66.3 kg)  08/23/22 148 lb 9.6 oz (67.4 kg)  08/08/22 147 lb 0.6 oz (66.7 kg)    Body mass index is 23.27 kg/m.  Performance status (ECOG): 1 - Symptomatic but completely ambulatory  PHYSICAL EXAM:  Physical Exam Vitals and nursing note reviewed.  Constitutional:      General: She is not in acute distress.  Appearance: Normal appearance. She is normal weight. She is not ill-appearing, toxic-appearing or diaphoretic.  HENT:     Head: Normocephalic and atraumatic.     Right Ear: Tympanic membrane, ear canal and external ear normal. There is no impacted cerumen.     Left Ear: Tympanic membrane, ear canal and external ear normal. There is no impacted cerumen.     Nose: Nose normal. No congestion or rhinorrhea.     Mouth/Throat:     Mouth: Mucous membranes are moist.     Pharynx: Oropharynx is clear. No oropharyngeal exudate or posterior oropharyngeal  erythema.  Eyes:     General: No scleral icterus.       Right eye: No discharge.        Left eye: No discharge.     Extraocular Movements: Extraocular movements intact.     Conjunctiva/sclera: Conjunctivae normal.     Pupils: Pupils are equal, round, and reactive to light.  Neck:     Vascular: No carotid bruit.  Cardiovascular:     Rate and Rhythm: Normal rate and regular rhythm.     Pulses: Normal pulses.     Heart sounds: Normal heart sounds. No murmur heard.    No friction rub. No gallop.  Pulmonary:     Effort: Pulmonary effort is normal. No respiratory distress.     Breath sounds: No stridor. Wheezing (mild, occasional) present. No rhonchi or rales.  Chest:     Chest wall: No tenderness.  Abdominal:     General: Bowel sounds are normal. There is no distension.     Palpations: Abdomen is soft. There is no hepatomegaly, splenomegaly or mass.     Tenderness: There is no abdominal tenderness. There is no right CVA tenderness, left CVA tenderness, guarding or rebound.     Hernia: No hernia is present.     Comments: Scar in the right middle abdomen from a prior ostomy.   Musculoskeletal:        General: No swelling, tenderness, deformity or signs of injury. Normal range of motion.     Cervical back: Normal range of motion and neck supple. No rigidity or tenderness.     Right lower leg: No edema.     Left lower leg: No edema.  Lymphadenopathy:     Cervical: No cervical adenopathy.     Right cervical: No superficial, deep or posterior cervical adenopathy.    Left cervical: No superficial, deep or posterior cervical adenopathy.     Upper Body:     Right upper body: No supraclavicular, axillary or pectoral adenopathy.     Left upper body: No supraclavicular, axillary or pectoral adenopathy.  Skin:    General: Skin is warm and dry.     Coloration: Skin is not jaundiced or pale.     Findings: No bruising, erythema, lesion or rash.  Neurological:     General: No focal deficit  present.     Mental Status: She is alert and oriented to person, place, and time. Mental status is at baseline.     Cranial Nerves: No cranial nerve deficit.     Sensory: No sensory deficit.     Motor: No weakness.     Coordination: Coordination normal.     Gait: Gait normal.     Deep Tendon Reflexes: Reflexes normal.     Comments: Left hemiparesis with left foot drop, with a brace in place.   Psychiatric:        Mood and Affect: Mood normal.  Behavior: Behavior normal.        Thought Content: Thought content normal.        Judgment: Judgment normal.    With a brace in place LABS:    CBC Ref Range & Units 08/14/2022  WBC 4.40 - 11.00 10*3/uL 9.20  RBC 4.10 - 5.10 10*6/uL 4.60  Hemoglobin 12.3 - 15.3 g/dL 16.114.3  Hematocrit 09.635.9 - 44.6 % 40.3  Mean Corpuscular Volume (MCV) 80.0 - 96.0 fL 87.6  Mean Corpuscular Hemoglobin (MCH) 27.5 - 33.2 pg 31.0  Mean Corpuscular Hemoglobin Conc (MCHC) 33.0 - 37.0 g/dL 04.535.4  Red Cell Distribution Width (RDW) 12.3 - 17.0 % 13.4  Platelet Count (PLT) 150 - 450 10*3/uL 262  Mean Platelet Volume (MPV) 6.8 - 10.2 fL 8.8   CMP Ref Range & Units 08/14/2022  Sodium 136 - 145 mmol/L 138  Potassium 3.5 - 5.1 mmol/L 4.3  Comment: NO VISIBLE HEMOLYSIS  Chloride 98 - 107 mmol/L 103  CO2 21 - 31 mmol/L 27  Anion Gap 6 - 14 mmol/L 8  Glucose, Random 70 - 99 mg/dL 72  Blood Urea Nitrogen (BUN) 7 - 25 mg/dL 8  Creatinine 4.090.60 - 8.111.20 mg/dL 9.140.71  eGFR >78>59 GN/FAO/1.30Q6mL/min/1.73m2 >90  Albumin 3.5 - 5.7 g/dL 4.1  Total Protein 6.4 - 8.9 g/dL 6.7  Bilirubin, Total 0.3 - 1.0 mg/dL 0.3  Alkaline Phosphatase (ALP) 34 - 104 U/L 143 High   Aspartate Aminotransferase (AST) 13 - 39 U/L 13  Alanine Aminotransferase (ALT) 7 - 52 U/L 15  Calcium 8.6 - 10.3 mg/dL 9.4    Ref Range & Units 7 d ago 08/14/2022  TSH 0.450 - 5.330 uIU/mL 1.013    Ref Range & Units 3 mo ago 04/25/2022  LDL Direct <100 mg/dL 78  Total Cholesterol <578<200 MG/DL 469150  Triglycerides <629<150 MG/DL  528186 High   HDL Cholesterol >=60 MG/DL 42 Low   Total Chol / HDL Cholesterol <4.5 3.6  Non-HDL Cholesterol MG/DL 413108      Latest Ref Rng & Units 08/23/2022    9:04 AM 02/22/2022   12:00 AM 06/13/2021   12:00 AM  CBC  WBC 4.0 - 10.5 K/uL 6.8  7.6     6.3      Hemoglobin 12.0 - 15.0 g/dL 24.413.7  01.015.0     27.215.6      Hematocrit 36.0 - 46.0 % 43.5  45     47      Platelets 150 - 400 K/uL 303  234     242         This result is from an external source.      Latest Ref Rng & Units 08/23/2022    9:04 AM 02/22/2022   12:00 AM 06/13/2021   12:00 AM  CMP  Glucose 70 - 99 mg/dL 536100     BUN 6 - 20 mg/dL 14  8     12       Creatinine 0.44 - 1.00 mg/dL 6.440.84  0.9     0.8      Sodium 135 - 145 mmol/L 137  143     141      Potassium 3.5 - 5.1 mmol/L 3.7  3.7     4.1      Chloride 98 - 111 mmol/L 104  108     106      CO2 22 - 32 mmol/L 24  26     27       Calcium 8.9 -  10.3 mg/dL 9.0  9.4     9.3      Total Protein 6.5 - 8.1 g/dL 7.5     Total Bilirubin 0.3 - 1.2 mg/dL 0.2     Alkaline Phos 38 - 126 U/L 124  143     110      AST 15 - 41 U/L 21  33     24      ALT 0 - 44 U/L 23  41     21         This result is from an external source.     Lab Results  Component Value Date   CEA1 2.1 08/23/2022   /  CEA  Date Value Ref Range Status  08/23/2022 2.1 0.0 - 4.7 ng/mL Final    Comment:    (NOTE)                             Nonsmokers          <3.9                             Smokers             <5.6 [employer] Electrochemiluminescence Immunoassay (ECLIA) Values obtained with different assay methods or kits cannot be used interchangeably.  Results cannot be interpreted as absolute evidence of the presence or absence of malignant disease. Performed At: [facility] [address] [place], [place] [id number] Jolene SchimkeNagendra Sanjai MD MV:[id number]:[phone]      STUDIES:   Exam:08/23/2022 CT Chest without Contrast Low-Dose for Lung Cancer Screening Impression: Lung-RADS 1, negative.  Continue annual screening with low-dose chest CT without contrast in 12 months. Age advanced coronary artery atherosclerosis. Recommend assessment of coronary risk factors. Pneumobilia, likely related to prior sphincterotomy. Aortic Atherosclerosis (ICD10-170.0) and Emphysema (ICD10-J43.9)   Allergies:  Allergies  Allergen Reactions   Iodine-131 Anaphylaxis   Ivp Dye [Iodinated Contrast Media] Shortness Of Breath and Rash   Other Nausea And Vomiting, Rash, Shortness Of Breath and Other (See Comments)    Other reaction(s): Respiratory Distress (ALLERGY/intolerance)   Morphine Itching   Pneumococcal Vaccine    Codeine Itching, Nausea And Vomiting and Other (See Comments)   Influenza Vaccine Live Rash   Morphine And Related Rash   Pneumococcal Vaccines Rash   Shellfish Allergy Nausea And Vomiting    Current Medications: Current Outpatient Medications  Medication Sig Dispense Refill   ramipril (ALTACE) 10 MG capsule Take 1 capsule by mouth daily.     acetaminophen (TYLENOL) 500 MG tablet Take 1,000 mg by mouth every 4 (four) hours as needed for moderate pain.      ascorbic acid (VITAMIN C) 500 MG tablet Take 500 mg by mouth 2 (two) times daily. (0900 & 2100)     aspirin 81 MG chewable tablet Chew 81 mg by mouth daily at 6 PM. (1700)     atorvastatin (LIPITOR) 80 MG tablet Take 1 tablet (80 mg total) by mouth daily at 6 PM. (Patient taking differently: Take 80 mg by mouth at bedtime. (2000)) 30 tablet 0   B Complex-C (B-COMPLEX WITH VITAMIN C) tablet Take 1 tablet by mouth daily.     cholecalciferol (VITAMIN D) 25 MCG (1000 UNIT) tablet Take 2,000 Units by mouth daily.     cyanocobalamin (VITAMIN B12) 1000 MCG/ML injection Inject 1,000 mcg into the muscle every 30 (thirty)  days.     FIBER ADULT GUMMIES PO Take 5 mg by mouth in the morning.     loperamide (IMODIUM) 2 MG capsule Take 1 capsule (2 mg total) by mouth as needed for diarrhea or loose stools. 120 capsule 0   sertraline  (ZOLOFT) 50 MG tablet Take 1 tablet by mouth daily.     No current facility-administered medications for this visit.     ASSESSMENT & PLAN:  Assessment:   1. Clinical stage IIA rectal cancer treated with neoadjuvant chemoradiation with excellent response.  She received adjuvant FOLFOX chemotherapy for 5 cycles.  We feel she has a good prognosis and is over 4 years out from diagnosis. She had her repeat colonoscopy with Dr. Jennye Boroughs in March, 2023, which was negative.  2. History of tobacco abuse, but is still smoking some now.  3. History of large stroke with persistent left hemiparesis.  She will likely need to have carotid endarterectomy on the left side also eventually.   4. Colorectal cancer at early age.  Myriad myRisk Hereditary Cancer Panel did not reveal any clinically significant mutation or variant of uncertain significance.  5. B12 deficiency. Her B12 level was quite low at 156 despite oral supplement, so I do consider her to have pernicious anemia and explained that she will need to stay on lifelong B12 injections.   Plan: She will continue monthly B12 injections. She had her colonoscopy in 2023 that revealed diverticulitis. I wil see her back in 6 months with CBC, CMP, and CEA.  Dr. Martha Clan continues to schedule her annual mammograms and the last one in April, 2023 was clear. She verbalizes understanding of an agreement to the plan today.  She knows to call the office should any new questions or concerns arise.  I provided 20 minutes of face-to-face time during this this encounter and > 50% was spent counseling as documented under my assessment and plan.   Dellia Beckwith, MD [facility] AT [facility] [address] [place] [address] Dept: [phone] Dept Fax: [phone]    Rulon Sera Lassiter,acting as a scribe for Dellia Beckwith, MD.,have documented all relevant documentation on the behalf of Dellia Beckwith, MD,as directed by  Dellia Beckwith, MD while in the presence of Dellia Beckwith, MD.

## 2022-08-23 ENCOUNTER — Other Ambulatory Visit: Payer: Medicaid Other

## 2022-08-23 ENCOUNTER — Ambulatory Visit: Payer: Medicaid Other | Admitting: Oncology

## 2022-08-23 ENCOUNTER — Encounter: Payer: Self-pay | Admitting: Oncology

## 2022-08-23 ENCOUNTER — Other Ambulatory Visit: Payer: Self-pay | Admitting: Oncology

## 2022-08-23 ENCOUNTER — Inpatient Hospital Stay: Payer: Medicaid Other | Attending: Oncology

## 2022-08-23 ENCOUNTER — Inpatient Hospital Stay (INDEPENDENT_AMBULATORY_CARE_PROVIDER_SITE_OTHER): Payer: Medicaid Other | Admitting: Oncology

## 2022-08-23 ENCOUNTER — Telehealth: Payer: Self-pay | Admitting: Oncology

## 2022-08-23 VITALS — BP 105/61 | HR 79 | Temp 98.1°F | Resp 17 | Ht 67.0 in | Wt 148.6 lb

## 2022-08-23 DIAGNOSIS — E538 Deficiency of other specified B group vitamins: Secondary | ICD-10-CM

## 2022-08-23 DIAGNOSIS — C20 Malignant neoplasm of rectum: Secondary | ICD-10-CM

## 2022-08-23 DIAGNOSIS — Z79899 Other long term (current) drug therapy: Secondary | ICD-10-CM | POA: Insufficient documentation

## 2022-08-23 LAB — CMP (CANCER CENTER ONLY)
ALT: 23 U/L (ref 0–44)
AST: 21 U/L (ref 15–41)
Albumin: 4.2 g/dL (ref 3.5–5.0)
Alkaline Phosphatase: 124 U/L (ref 38–126)
Anion gap: 9 (ref 5–15)
BUN: 14 mg/dL (ref 6–20)
CO2: 24 mmol/L (ref 22–32)
Calcium: 9 mg/dL (ref 8.9–10.3)
Chloride: 104 mmol/L (ref 98–111)
Creatinine: 0.84 mg/dL (ref 0.44–1.00)
GFR, Estimated: >60 mL/min (ref >60)
Glucose, Bld: 100 mg/dL — ABNORMAL HIGH (ref 70–99)
Potassium: 3.7 mmol/L (ref 3.5–5.1)
Sodium: 137 mmol/L (ref 135–145)
Total Bilirubin: 0.2 mg/dL — ABNORMAL LOW (ref 0.3–1.2)
Total Protein: 7.5 g/dL (ref 6.5–8.1)

## 2022-08-23 LAB — CBC WITH DIFFERENTIAL (CANCER CENTER ONLY)
Abs Immature Granulocytes: 0.01 10*3/uL (ref 0.00–0.07)
Basophils Absolute: 0.1 10*3/uL (ref 0.0–0.1)
Basophils Relative: 1 %
Eosinophils Absolute: 0.2 10*3/uL (ref 0.0–0.5)
Eosinophils Relative: 3 %
HCT: 43.5 % (ref 36.0–46.0)
Hemoglobin: 13.7 g/dL (ref 12.0–15.0)
Immature Granulocytes: 0 %
Lymphocytes Relative: 16 %
Lymphs Abs: 1.1 10*3/uL (ref 0.7–4.0)
MCH: 29.3 pg (ref 26.0–34.0)
MCHC: 31.5 g/dL (ref 30.0–36.0)
MCV: 92.9 fL (ref 80.0–100.0)
Monocytes Absolute: 0.4 10*3/uL (ref 0.1–1.0)
Monocytes Relative: 5 %
Neutro Abs: 5.1 10*3/uL (ref 1.7–7.7)
Neutrophils Relative %: 75 %
Platelet Count: 303 10*3/uL (ref 150–400)
RBC: 4.68 MIL/uL (ref 3.87–5.11)
RDW: 13.4 % (ref 11.5–15.5)
WBC Count: 6.8 10*3/uL (ref 4.0–10.5)
nRBC: 0 % (ref 0.0–0.2)

## 2022-08-23 LAB — VITAMIN B12: Vitamin B-12: 439 pg/mL (ref 180–914)

## 2022-08-23 NOTE — Telephone Encounter (Signed)
08/23/22 Next appt scheduled and confirmed with patient

## 2022-08-24 LAB — CEA: CEA: 2.1 ng/mL (ref 0.0–4.7)

## 2022-09-06 ENCOUNTER — Inpatient Hospital Stay: Payer: Medicaid Other

## 2022-09-06 VITALS — BP 124/67 | HR 110 | Temp 98.6°F | Resp 20 | Ht 67.0 in | Wt 146.1 lb

## 2022-09-06 DIAGNOSIS — E538 Deficiency of other specified B group vitamins: Secondary | ICD-10-CM | POA: Diagnosis not present

## 2022-09-06 MED ORDER — CYANOCOBALAMIN 1000 MCG/ML IJ SOLN
1000.0000 ug | Freq: Once | INTRAMUSCULAR | Status: AC
  Administered 2022-09-06: 1000 ug via INTRAMUSCULAR
  Filled 2022-09-06: qty 1

## 2022-09-06 NOTE — Patient Instructions (Signed)

## 2022-09-16 ENCOUNTER — Telehealth: Payer: Self-pay

## 2022-09-16 NOTE — Telephone Encounter (Signed)
-----   Message from Dellia Beckwith, MD sent at 09/12/2022  7:19 PM EDT ----- Regarding: call Tell her labs all look great

## 2022-09-16 NOTE — Telephone Encounter (Signed)
Patient notified of lab results

## 2022-09-19 ENCOUNTER — Encounter: Payer: Self-pay | Admitting: Oncology

## 2022-09-25 ENCOUNTER — Encounter: Payer: Self-pay | Admitting: Oncology

## 2022-10-04 ENCOUNTER — Encounter: Payer: Self-pay | Admitting: Oncology

## 2022-10-04 NOTE — Addendum Note (Signed)
Addended by: Domenic Schwab on: 10/04/2022 02:07 PM   Modules accepted: Orders

## 2022-10-07 ENCOUNTER — Inpatient Hospital Stay: Payer: Medicaid Other | Attending: Oncology

## 2022-10-07 VITALS — BP 141/62 | HR 83 | Temp 98.3°F | Resp 20 | Ht 67.0 in | Wt 150.1 lb

## 2022-10-07 DIAGNOSIS — E538 Deficiency of other specified B group vitamins: Secondary | ICD-10-CM | POA: Diagnosis present

## 2022-10-07 MED ORDER — CYANOCOBALAMIN 1000 MCG/ML IJ SOLN
1000.0000 ug | Freq: Once | INTRAMUSCULAR | Status: AC
  Administered 2022-10-07: 1000 ug via INTRAMUSCULAR
  Filled 2022-10-07: qty 1

## 2022-10-07 NOTE — Patient Instructions (Signed)

## 2022-11-06 ENCOUNTER — Inpatient Hospital Stay: Payer: Medicaid Other | Attending: Oncology

## 2022-11-06 VITALS — BP 103/55 | HR 75 | Temp 98.2°F | Resp 18 | Ht 67.0 in | Wt 153.8 lb

## 2022-11-06 DIAGNOSIS — E538 Deficiency of other specified B group vitamins: Secondary | ICD-10-CM | POA: Insufficient documentation

## 2022-11-06 MED ORDER — CYANOCOBALAMIN 1000 MCG/ML IJ SOLN
1000.0000 ug | Freq: Once | INTRAMUSCULAR | Status: AC
  Administered 2022-11-06: 1000 ug via INTRAMUSCULAR
  Filled 2022-11-06: qty 1

## 2022-11-06 NOTE — Patient Instructions (Signed)

## 2022-11-25 ENCOUNTER — Encounter: Payer: Self-pay | Admitting: Oncology

## 2022-11-25 NOTE — Telephone Encounter (Signed)
Done

## 2022-12-07 ENCOUNTER — Encounter: Payer: Self-pay | Admitting: Oncology

## 2022-12-07 NOTE — Addendum Note (Signed)
Addended by: Domenic Schwab on: 12/07/2022 09:06 AM   Modules accepted: Orders

## 2022-12-09 ENCOUNTER — Inpatient Hospital Stay: Payer: MEDICAID

## 2022-12-09 ENCOUNTER — Encounter: Payer: Self-pay | Admitting: Oncology

## 2022-12-16 ENCOUNTER — Inpatient Hospital Stay: Payer: MEDICAID | Attending: Oncology

## 2022-12-16 ENCOUNTER — Encounter: Payer: Self-pay | Admitting: Oncology

## 2022-12-16 VITALS — BP 122/76 | HR 107 | Temp 98.6°F | Resp 14 | Ht 67.0 in | Wt 148.0 lb

## 2022-12-16 DIAGNOSIS — E538 Deficiency of other specified B group vitamins: Secondary | ICD-10-CM | POA: Diagnosis present

## 2022-12-16 MED ORDER — CYANOCOBALAMIN 1000 MCG/ML IJ SOLN
1000.0000 ug | Freq: Once | INTRAMUSCULAR | Status: AC
  Administered 2022-12-16: 1000 ug via INTRAMUSCULAR
  Filled 2022-12-16: qty 1

## 2022-12-16 NOTE — Addendum Note (Signed)
Addended by: Domenic Schwab on: 12/16/2022 08:53 AM   Modules accepted: Orders

## 2023-02-05 ENCOUNTER — Encounter: Payer: Self-pay | Admitting: Oncology

## 2023-02-05 NOTE — Addendum Note (Signed)
Addended by: Domenic Schwab on: 02/05/2023 04:07 PM   Modules accepted: Orders

## 2023-02-06 ENCOUNTER — Inpatient Hospital Stay: Payer: MEDICAID | Attending: Oncology

## 2023-02-06 VITALS — BP 138/77 | HR 86 | Temp 97.6°F | Resp 16 | Ht 67.0 in | Wt 145.1 lb

## 2023-02-06 DIAGNOSIS — E538 Deficiency of other specified B group vitamins: Secondary | ICD-10-CM | POA: Diagnosis present

## 2023-02-06 MED ORDER — CYANOCOBALAMIN 1000 MCG/ML IJ SOLN
1000.0000 ug | Freq: Once | INTRAMUSCULAR | Status: AC
  Administered 2023-02-06: 1000 ug via INTRAMUSCULAR
  Filled 2023-02-06: qty 1

## 2023-02-20 NOTE — Progress Notes (Signed)
[facility]  [address] [place],  [place]  [address] [phone]  Clinic Day: 02/21/2023  Referring physician: Everlean Cherry, MD   CHIEF COMPLAINT:  CC: Stage IIA adenocarcinoma of the rectum  Current Treatment:  Surveillance  HISTORY OF PRESENT ILLNESS:  Jill Williams is a 52 y.o. 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  She presented with intermittent rectal bleeding as well as chronic diarrhea for at least 6 months.  Colonoscopy revealed a mass in the right posterior wall about 3 cm from the anal verge extending to 8 cm above the anal verge, encompassing more than half of the mucosal wall.  Biopsy revealed invasive adenocarcinoma.  Neoadjuvant chemoradiation was recommended, to be followed by adjuvant FOLFOX postop.  CT imaging revealed a 6 cm annual lower rectal mass with a small right perirectal node measuring 5 mm and a left perirectal node measuring 1.8 cm.  There was a 1 cm calcified right liver lesion felt to be benign.  There were scattered subcentimeter bilateral lung nodules, the largest measuring 6 mm.  PET revealed an extensive hypermetabolic rectal mass with an SUV of 12.5.  The left pelvic sidewall lymph node had decreased to 6.5 mm and was not hypermetabolic.  The right 5 mm perirectal lymph node was not hypermetabolic.  The lung nodules were not hypermetabolic, but continued follow-up was recommended.  She received neoadjuvant chemoradiation with infusional 5 fluorouracil week 1 and week 5 and started treatment on March 16th.  She had severe chronic diarrhea, but continued daily radiation.   She has a history of a stroke in 2015 which has left her with left upper extremity paraplegia and left lower extremity weakness and foot drop.  She requires a brace for her left ankle.  She is status post right endarterectomy.  She also has hypertension, hyperlipidemia, peripheral vascular  disease, osteoarthritis, gastroesophageal reflux disease, and COPD.  Chest x-ray done December 30th clearly showed hyperinflation.  Unfortunately, she continues to smoke.  She has not had further menstrual periods since her stroke. She will eventually require a left carotid endarterectomy.  Due to her having rectal cancer at age 64, she met criteria for testing for hereditary colorectal cancer, but she initially declined.  Review of her family history reveals her father had lung cancer at an unknown age, a maternal aunt had breast cancer at age 69, her maternal grandmother had an unknown cancer at an unknown age and a paternal aunt had breast cancer at an unknown age.  Myriad myRisk Hereditary Cancer Panel test did not reveal any clinically significant mutation or variant of uncertain significance.  Her lifetime breast cancer risk was estimated at 7.8%.  She underwent surgical resection in July 2020 and was found to have an excellent response to neoadjuvant chemoradiation.  Pathology revealed scant residual foci of adenocarcinoma with the largest foci measuring 0.4 cm.  Eleven lymph nodes were negative for metastasis.  She has had a lot of difficulty with the colostomy bags and ended up in a rehab facility during her postop chemotherapy.  She was recommended for adjuvant FOLFOX chemotherapy.  Her son died in 2023/03/07 in a shooting accident and she stopped eating and drinking for 2 weeks.  She was then admitted to the hospital with a sodium of 166 and was in for several days of IV fluids.  We felt this was from dehydration and malnutrition.  MRI brain, did not reveal  acute findings, the massive prior stroke with encephalomalacia was seen.  She had been deteriorating with weight loss, anorexia and depression.  She finally agreed to take trazodone 75 mg daily.  She was severely weak.  We discontinued chemotherapy after 5 cycles due to her performance status.  However, her long term prognosis is still good.  She is  on  B12 supplementation.  She underwent colostomy reversal on January 21st at [facility] with Dr. Romie Levee.    INTERVAL HISTORY:  Jill Williams is here for follow up for her stage IIA adenocarcinoma of the rectum. She was diagnosed in January, 2020.  Patient states that she feels well and has no complaints of pain but has had trouble sleeping. She sees her PCP next month. She no longer has a port in. She has had her last colonoscopy in 2023 with Dr. Jennye Boroughs and I recommend that be repeated once every 5 years. She had her annual pap smear this year and will repeat every 5 years. She will continue her monthly B-12 injection, her last B-12 level was 439 on 08/23/2022 on injections so this would be too low if we stopped them. Her labs today are pending. I will see her back in 1 year with CBC, CMP, and CEA. She denies signs of infection such as sore throat, sinus drainage, cough, or urinary symptoms.  She denies fevers or recurrent chills. She denies pain. She denies nausea, vomiting, chest pain, dyspnea or cough. Her appetite is good and her weight has been stable.  REVIEW OF SYSTEMS:  Review of Systems  Constitutional: Negative.  Negative for appetite change, chills, diaphoresis, fatigue, fever and unexpected weight change.  HENT:  Negative.  Negative for hearing loss, lump/mass, mouth sores, nosebleeds, sore throat, tinnitus, trouble swallowing and voice change.   Eyes: Negative.  Negative for eye problems and icterus.  Respiratory:  Negative for chest tightness, cough, hemoptysis, shortness of breath and wheezing.   Cardiovascular: Negative.  Negative for chest pain, leg swelling and palpitations.  Gastrointestinal: Negative.  Negative for abdominal distention, abdominal pain, blood in stool, constipation, diarrhea, nausea, rectal pain and vomiting.  Endocrine: Negative.   Genitourinary: Negative.  Negative for bladder incontinence, difficulty urinating, dyspareunia, dysuria, frequency, hematuria,  menstrual problem, nocturia, pelvic pain, vaginal bleeding and vaginal discharge.   Musculoskeletal:  Positive for gait problem. Negative for arthralgias, back pain, flank pain, myalgias, neck pain and neck stiffness.  Skin: Negative.  Negative for itching, rash and wound.  Neurological:  Positive for extremity weakness (chronic of the right side from prior stroke) and gait problem. Negative for dizziness, headaches, light-headedness, numbness, seizures and speech difficulty.  Hematological: Negative.  Negative for adenopathy. Does not bruise/bleed easily.  Psychiatric/Behavioral:  Positive for depression and sleep disturbance. Negative for confusion, decreased concentration and suicidal ideas. The patient is nervous/anxious.     VITALS:  Blood pressure (!) 143/69, pulse 81, temperature (!) 97.5 F (36.4 C), temperature source Oral, resp. rate 16, height 5\' 7"  (1.702 m), weight 145 lb 14.4 oz (66.2 kg), last menstrual period 07/18/2014, SpO2 97%.  Wt Readings from Last 3 Encounters:  03/10/23 144 lb 4 oz (65.4 kg)  02/21/23 145 lb 14.4 oz (66.2 kg)  02/06/23 145 lb 1.3 oz (65.8 kg)    Body mass index is 22.85 kg/m.  Performance status (ECOG): 1 - Symptomatic but completely ambulatory  PHYSICAL EXAM:  Physical Exam Vitals and nursing note reviewed.  Constitutional:      General: She is not in acute  distress.    Appearance: Normal appearance. She is normal weight. She is not ill-appearing, toxic-appearing or diaphoretic.  HENT:     Head: Normocephalic and atraumatic.     Right Ear: Tympanic membrane, ear canal and external ear normal. There is no impacted cerumen.     Left Ear: Tympanic membrane, ear canal and external ear normal. There is no impacted cerumen.     Nose: Nose normal. No congestion or rhinorrhea.     Mouth/Throat:     Mouth: Mucous membranes are moist.     Pharynx: Oropharynx is clear. No oropharyngeal exudate or posterior oropharyngeal erythema.  Eyes:     General: No  scleral icterus.       Right eye: No discharge.        Left eye: No discharge.     Extraocular Movements: Extraocular movements intact.     Conjunctiva/sclera: Conjunctivae normal.     Pupils: Pupils are equal, round, and reactive to light.  Neck:     Vascular: No carotid bruit.  Cardiovascular:     Rate and Rhythm: Normal rate and regular rhythm.     Pulses: Normal pulses.     Heart sounds: Normal heart sounds. No murmur heard.    No friction rub. No gallop.  Pulmonary:     Effort: Pulmonary effort is normal. No respiratory distress.     Breath sounds: No stridor. No wheezing (mild, occasional), rhonchi or rales.  Chest:     Chest wall: No tenderness.  Abdominal:     General: Bowel sounds are normal. There is no distension.     Palpations: Abdomen is soft. There is no hepatomegaly, splenomegaly or mass.     Tenderness: There is no abdominal tenderness. There is no right CVA tenderness, left CVA tenderness, guarding or rebound.     Hernia: No hernia is present.     Comments: Well healed scars of the lower abdomen and right mid abdomen.   Musculoskeletal:        General: No swelling, tenderness, deformity or signs of injury. Normal range of motion.     Cervical back: Normal range of motion and neck supple. No rigidity or tenderness.     Right lower leg: No edema.     Left lower leg: No edema.  Lymphadenopathy:     Cervical: No cervical adenopathy.     Right cervical: No superficial, deep or posterior cervical adenopathy.    Left cervical: No superficial, deep or posterior cervical adenopathy.     Upper Body:     Right upper body: No supraclavicular, axillary or pectoral adenopathy.     Left upper body: No supraclavicular, axillary or pectoral adenopathy.  Skin:    General: Skin is warm and dry.     Coloration: Skin is not jaundiced or pale.     Findings: No bruising, erythema, lesion or rash.  Neurological:     General: No focal deficit present.     Mental Status: She is  alert and oriented to person, place, and time. Mental status is at baseline.     Cranial Nerves: No cranial nerve deficit.     Sensory: No sensory deficit.     Motor: No weakness.     Coordination: Coordination normal.     Gait: Gait abnormal.     Deep Tendon Reflexes: Reflexes normal.     Comments: Left hemiparesis with left foot drop, with a brace in place.   Psychiatric:        Mood  and Affect: Mood normal.        Behavior: Behavior normal.        Thought Content: Thought content normal.        Judgment: Judgment normal.    LABS:      Latest Ref Rng & Units 02/21/2023    9:57 AM 08/23/2022    9:04 AM 02/22/2022   12:00 AM  CBC  WBC 4.0 - 10.5 K/uL 7.5  6.8  7.6      Hemoglobin 12.0 - 15.0 g/dL 16.1  09.6  04.5      Hematocrit 36.0 - 46.0 % 43.8  43.5  45      Platelets 150 - 400 K/uL 236  303  234         This result is from an external source.      Latest Ref Rng & Units 02/21/2023    9:57 AM 08/23/2022    9:04 AM 02/22/2022   12:00 AM  CMP  Glucose 70 - 99 mg/dL 88  409    BUN 6 - 20 mg/dL 12  14  8       Creatinine 0.44 - 1.00 mg/dL 8.11  9.14  0.9      Sodium 135 - 145 mmol/L 137  137  143      Potassium 3.5 - 5.1 mmol/L 3.6  3.7  3.7      Chloride 98 - 111 mmol/L 105  104  108      CO2 22 - 32 mmol/L 21  24  26       Calcium 8.9 - 10.3 mg/dL 9.0  9.0  9.4      Total Protein 6.5 - 8.1 g/dL 7.3  7.5    Total Bilirubin 0.3 - 1.2 mg/dL 0.4  0.2    Alkaline Phos 38 - 126 U/L 137  124  143      AST 15 - 41 U/L 14  21  33      ALT 0 - 44 U/L 12  23  41         This result is from an external source.    Lab Results  Component Value Date   CEA1 1.6 02/21/2023   /  CEA  Date Value Ref Range Status  02/21/2023 1.6 0.0 - 4.7 ng/mL Final    Comment:    (NOTE)                             Nonsmokers          <3.9                             Smokers             <5.6 Roche [employer] Electrochemiluminescence Immunoassay (ECLIA) Values obtained with different assay  methods or kits cannot be used interchangeably.  Results cannot be interpreted as absolute evidence of the presence or absence of malignant disease. Performed At: [facility] [address] [place], [place] [id number] Jolene Schimke MD YQ:[id number]     STUDIES:  Exam:08/23/2022 CT Chest without Contrast Low-Dose for Lung Cancer Screening Impression: Lung-RADS 1, negative. Continue annual screening with low-dose chest CT without contrast in 12 months. Age advanced coronary artery atherosclerosis. Recommend assessment of coronary risk factors. Pneumobilia, likely related to prior sphincterotomy. Aortic Atherosclerosis (ICD10-170.0) and Emphysema (ICD10-J43.9)   Allergies:  Allergies  Allergen Reactions  Iodine-131 Anaphylaxis   Ivp Dye [Iodinated Contrast Media] Shortness Of Breath and Rash   Other Nausea And Vomiting, Rash, Shortness Of Breath and Other (See Comments)    Other reaction(s): Respiratory Distress (ALLERGY/intolerance)   Morphine Itching   Pneumococcal Vaccine    Codeine Itching, Nausea And Vomiting and Other (See Comments)   Influenza Vaccine Live Rash   Morphine And Codeine Rash   Pneumococcal Vaccines Rash   Shellfish Allergy Nausea And Vomiting    Current Medications: Current Outpatient Medications  Medication Sig Dispense Refill   acetaminophen (TYLENOL) 500 MG tablet Take 1,000 mg by mouth every 4 (four) hours as needed for moderate pain.      ascorbic acid (VITAMIN C) 500 MG tablet Take 500 mg by mouth 2 (two) times daily. (0900 & 2100)     aspirin 81 MG chewable tablet Chew 81 mg by mouth daily at 6 PM. (1700)     atorvastatin (LIPITOR) 80 MG tablet Take 1 tablet (80 mg total) by mouth daily at 6 PM. (Patient taking differently: Take 80 mg by mouth at bedtime. (2000)) 30 tablet 0   B Complex-C (B-COMPLEX WITH VITAMIN C) tablet Take 1 tablet by mouth daily.     cholecalciferol (VITAMIN D) 25 MCG (1000 UNIT) tablet Take 2,000 Units by mouth  daily.     cyanocobalamin (VITAMIN B12) 1000 MCG/ML injection Inject 1,000 mcg into the muscle every 30 (thirty) days.     FIBER ADULT GUMMIES PO Take 5 mg by mouth in the morning.     loperamide (IMODIUM) 2 MG capsule Take 1 capsule (2 mg total) by mouth as needed for diarrhea or loose stools. 120 capsule 0   ramipril (ALTACE) 10 MG capsule Take 1 capsule by mouth daily.     sertraline (ZOLOFT) 100 MG tablet Take 1 tablet by mouth daily.     No current facility-administered medications for this visit.     ASSESSMENT & PLAN:  Assessment:   1. Clinical stage IIA rectal cancer treated with neoadjuvant chemoradiation with excellent response.  She received adjuvant FOLFOX chemotherapy for 5 cycles.  We feel she has a good prognosis and is over 4 years out from diagnosis. She had her repeat colonoscopy with Dr. Jennye Boroughs in March, 2023, which was negative.  2. History of tobacco abuse, but is still smoking some now.  3. History of large stroke with persistent left hemiparesis.  She will likely need to have carotid endarterectomy on the left side also eventually.   4. Colorectal cancer at early age.  Myriad myRisk Hereditary Cancer Panel did not reveal any clinically significant mutation or variant of uncertain significance.  5. B12 deficiency. Her B12 level was quite low at 156 despite oral supplement, so I do consider her to have pernicious anemia and explained that she will need to stay on lifelong B12 injections.   Plan: She see's her PCP next month. She no longer has a port in. She has had her last colonoscopy in 2023 with Dr. Jennye Boroughs and I recommend that be repeated once every 5 years. She had her annual pap smear this year and will repeat every 5 years. She will continue her monthly B-12 injection, her last B-12 level was 439 on 08/23/2022 on injections so this would be too low if we stopped them. Her labs today are pending. I will see her back in 1 year with CBC, CMP, and CEA. She  verbalizes understanding of an agreement to the plan today.  She knows  to call the office should any new questions or concerns arise.  I provided 16 minutes of face-to-face time during this this encounter and > 50% was spent counseling as documented under my assessment and plan.   Dellia Beckwith, MD [facility] [address] [place] [address] Dept: [phone] Dept Fax: [phone]    Rulon Sera Lassiter,acting as a scribe for Dellia Beckwith, MD.,have documented all relevant documentation on the behalf of Dellia Beckwith, MD,as directed by  Dellia Beckwith, MD while in the presence of Dellia Beckwith, MD.

## 2023-02-21 ENCOUNTER — Encounter: Payer: Self-pay | Admitting: Oncology

## 2023-02-21 ENCOUNTER — Other Ambulatory Visit: Payer: Self-pay | Admitting: Oncology

## 2023-02-21 ENCOUNTER — Inpatient Hospital Stay: Payer: MEDICAID | Attending: Oncology | Admitting: Oncology

## 2023-02-21 ENCOUNTER — Inpatient Hospital Stay: Payer: MEDICAID | Attending: Oncology

## 2023-02-21 VITALS — BP 143/69 | HR 81 | Temp 97.5°F | Resp 16 | Ht 67.0 in | Wt 145.9 lb

## 2023-02-21 DIAGNOSIS — C20 Malignant neoplasm of rectum: Secondary | ICD-10-CM

## 2023-02-21 DIAGNOSIS — C19 Malignant neoplasm of rectosigmoid junction: Secondary | ICD-10-CM | POA: Insufficient documentation

## 2023-02-21 DIAGNOSIS — Z79899 Other long term (current) drug therapy: Secondary | ICD-10-CM | POA: Insufficient documentation

## 2023-02-21 DIAGNOSIS — E538 Deficiency of other specified B group vitamins: Secondary | ICD-10-CM | POA: Diagnosis present

## 2023-02-21 LAB — CBC WITH DIFFERENTIAL (CANCER CENTER ONLY)
Abs Immature Granulocytes: 0.03 10*3/uL (ref 0.00–0.07)
Basophils Absolute: 0.1 10*3/uL (ref 0.0–0.1)
Basophils Relative: 1 %
Eosinophils Absolute: 0.1 10*3/uL (ref 0.0–0.5)
Eosinophils Relative: 2 %
HCT: 43.8 % (ref 36.0–46.0)
Hemoglobin: 14.3 g/dL (ref 12.0–15.0)
Immature Granulocytes: 0 %
Lymphocytes Relative: 19 %
Lymphs Abs: 1.4 10*3/uL (ref 0.7–4.0)
MCH: 29.5 pg (ref 26.0–34.0)
MCHC: 32.6 g/dL (ref 30.0–36.0)
MCV: 90.3 fL (ref 80.0–100.0)
Monocytes Absolute: 0.4 10*3/uL (ref 0.1–1.0)
Monocytes Relative: 6 %
Neutro Abs: 5.4 10*3/uL (ref 1.7–7.7)
Neutrophils Relative %: 72 %
Platelet Count: 236 10*3/uL (ref 150–400)
RBC: 4.85 MIL/uL (ref 3.87–5.11)
RDW: 13.4 % (ref 11.5–15.5)
WBC Count: 7.5 10*3/uL (ref 4.0–10.5)
nRBC: 0 % (ref 0.0–0.2)

## 2023-02-21 LAB — CMP (CANCER CENTER ONLY)
ALT: 12 U/L (ref 0–44)
AST: 14 U/L — ABNORMAL LOW (ref 15–41)
Albumin: 4 g/dL (ref 3.5–5.0)
Alkaline Phosphatase: 137 U/L — ABNORMAL HIGH (ref 38–126)
Anion gap: 11 (ref 5–15)
BUN: 12 mg/dL (ref 6–20)
CO2: 21 mmol/L — ABNORMAL LOW (ref 22–32)
Calcium: 9 mg/dL (ref 8.9–10.3)
Chloride: 105 mmol/L (ref 98–111)
Creatinine: 0.94 mg/dL (ref 0.44–1.00)
GFR, Estimated: >60 mL/min (ref >60)
Glucose, Bld: 88 mg/dL (ref 70–99)
Potassium: 3.6 mmol/L (ref 3.5–5.1)
Sodium: 137 mmol/L (ref 135–145)
Total Bilirubin: 0.4 mg/dL (ref 0.3–1.2)
Total Protein: 7.3 g/dL (ref 6.5–8.1)

## 2023-02-22 LAB — CEA: CEA: 1.6 ng/mL (ref 0.0–4.7)

## 2023-03-04 ENCOUNTER — Telehealth: Payer: Self-pay

## 2023-03-04 NOTE — Telephone Encounter (Signed)
Jill Williams called to make you aware that Dr Martha Clan increased her sertraline to 100mg  po every day. He told her to avoid drinking caffeine and to start drinking sleepy time tea.

## 2023-03-07 ENCOUNTER — Encounter: Payer: Self-pay | Admitting: Oncology

## 2023-03-07 NOTE — Addendum Note (Signed)
Addended by: Domenic Schwab on: 03/07/2023 02:52 PM   Modules accepted: Orders

## 2023-03-10 ENCOUNTER — Inpatient Hospital Stay: Payer: MEDICAID

## 2023-03-10 VITALS — BP 100/54 | HR 79 | Temp 97.3°F | Resp 18 | Wt 144.2 lb

## 2023-03-10 DIAGNOSIS — E538 Deficiency of other specified B group vitamins: Secondary | ICD-10-CM

## 2023-03-10 MED ORDER — CYANOCOBALAMIN 1000 MCG/ML IJ SOLN
1000.0000 ug | Freq: Once | INTRAMUSCULAR | Status: AC
  Administered 2023-03-10: 1000 ug via INTRAMUSCULAR
  Filled 2023-03-10: qty 1

## 2023-03-10 NOTE — Patient Instructions (Signed)
 Vitamin B12 Injection What is this medication? Vitamin B12 (VAHY tuh min B12) prevents and treats low vitamin B12 levels in your body. It is used in people who do not get enough vitamin B12 from their diet or when their digestive tract does not absorb enough. Vitamin B12 plays an important role in maintaining the health of your nervous system and red blood cells. This medicine may be used for other purposes; ask your health care provider or pharmacist if you have questions. COMMON BRAND NAME(S): B-12 Compliance Kit, B-12 Injection Kit, Cyomin, Dodex, LA-12, Nutri-Twelve, Physicians EZ Use B-12, Primabalt, Vitamin Deficiency Injectable System - B12 What should I tell my care team before I take this medication? They need to know if you have any of these conditions: Kidney disease Leber's disease Megaloblastic anemia An unusual or allergic reaction to cyanocobalamin, cobalt, other medications, foods, dyes, or preservatives Pregnant or trying to get pregnant Breast-feeding How should I use this medication? This medication is injected into a muscle or deeply under the skin. It is usually given in a clinic or care team's office. However, your care team may teach you how to inject yourself. Follow all instructions. Talk to your care team about the use of this medication in children. Special care may be needed. Overdosage: If you think you have taken too much of this medicine contact a poison control center or emergency room at once. NOTE: This medicine is only for you. Do not share this medicine with others. What if I miss a dose? If you are given your dose at a clinic or care team's office, call to reschedule your appointment. If you give your own injections, and you miss a dose, take it as soon as you can. If it is almost time for your next dose, take only that dose. Do not take double or extra doses. What may interact with this medication? Alcohol Colchicine This list may not describe all possible  interactions. Give your health care provider a list of all the medicines, herbs, non-prescription drugs, or dietary supplements you use. Also tell them if you smoke, drink alcohol, or use illegal drugs. Some items may interact with your medicine. What should I watch for while using this medication? Visit your care team regularly. You may need blood work done while you are taking this medication. You may need to follow a special diet. Talk to your care team. Limit your alcohol intake and avoid smoking to get the best benefit. What side effects may I notice from receiving this medication? Side effects that you should report to your care team as soon as possible: Allergic reactions--skin rash, itching, hives, swelling of the face, lips, tongue, or throat Swelling of the ankles, hands, or feet Trouble breathing Side effects that usually do not require medical attention (report to your care team if they continue or are bothersome): Diarrhea This list may not describe all possible side effects. Call your doctor for medical advice about side effects. You may report side effects to FDA at 1-800-FDA-1088. Where should I keep my medication? Keep out of the reach of children. Store at room temperature between 15 and 30 degrees C (59 and 85 degrees F). Protect from light. Throw away any unused medication after the expiration date. NOTE: This sheet is a summary. It may not cover all possible information. If you have questions about this medicine, talk to your doctor, pharmacist, or health care provider.  2024 Elsevier/Gold Standard (2021-02-06 00:00:00)

## 2023-03-11 ENCOUNTER — Encounter: Payer: Self-pay | Admitting: Oncology

## 2023-03-18 ENCOUNTER — Encounter: Payer: Self-pay | Admitting: Oncology

## 2023-04-07 ENCOUNTER — Inpatient Hospital Stay: Payer: MEDICAID | Attending: Oncology

## 2023-04-07 VITALS — BP 158/99 | HR 98 | Temp 98.3°F | Resp 18

## 2023-04-07 DIAGNOSIS — E538 Deficiency of other specified B group vitamins: Secondary | ICD-10-CM | POA: Diagnosis present

## 2023-04-07 MED ORDER — CYANOCOBALAMIN 1000 MCG/ML IJ SOLN
1000.0000 ug | Freq: Once | INTRAMUSCULAR | Status: AC
  Administered 2023-04-07: 1000 ug via INTRAMUSCULAR
  Filled 2023-04-07: qty 1

## 2023-04-07 NOTE — Patient Instructions (Signed)
 Vitamin B12 Injection What is this medication? Vitamin B12 (VAHY tuh min B12) prevents and treats low vitamin B12 levels in your body. It is used in people who do not get enough vitamin B12 from their diet or when their digestive tract does not absorb enough. Vitamin B12 plays an important role in maintaining the health of your nervous system and red blood cells. This medicine may be used for other purposes; ask your health care provider or pharmacist if you have questions. COMMON BRAND NAME(S): B-12 Compliance Kit, B-12 Injection Kit, Cyomin, Dodex, LA-12, Nutri-Twelve, Physicians EZ Use B-12, Primabalt, Vitamin Deficiency Injectable System - B12 What should I tell my care team before I take this medication? They need to know if you have any of these conditions: Kidney disease Leber's disease Megaloblastic anemia An unusual or allergic reaction to cyanocobalamin, cobalt, other medications, foods, dyes, or preservatives Pregnant or trying to get pregnant Breast-feeding How should I use this medication? This medication is injected into a muscle or deeply under the skin. It is usually given in a clinic or care team's office. However, your care team may teach you how to inject yourself. Follow all instructions. Talk to your care team about the use of this medication in children. Special care may be needed. Overdosage: If you think you have taken too much of this medicine contact a poison control center or emergency room at once. NOTE: This medicine is only for you. Do not share this medicine with others. What if I miss a dose? If you are given your dose at a clinic or care team's office, call to reschedule your appointment. If you give your own injections, and you miss a dose, take it as soon as you can. If it is almost time for your next dose, take only that dose. Do not take double or extra doses. What may interact with this medication? Alcohol Colchicine This list may not describe all possible  interactions. Give your health care provider a list of all the medicines, herbs, non-prescription drugs, or dietary supplements you use. Also tell them if you smoke, drink alcohol, or use illegal drugs. Some items may interact with your medicine. What should I watch for while using this medication? Visit your care team regularly. You may need blood work done while you are taking this medication. You may need to follow a special diet. Talk to your care team. Limit your alcohol intake and avoid smoking to get the best benefit. What side effects may I notice from receiving this medication? Side effects that you should report to your care team as soon as possible: Allergic reactions--skin rash, itching, hives, swelling of the face, lips, tongue, or throat Swelling of the ankles, hands, or feet Trouble breathing Side effects that usually do not require medical attention (report to your care team if they continue or are bothersome): Diarrhea This list may not describe all possible side effects. Call your doctor for medical advice about side effects. You may report side effects to FDA at 1-800-FDA-1088. Where should I keep my medication? Keep out of the reach of children. Store at room temperature between 15 and 30 degrees C (59 and 85 degrees F). Protect from light. Throw away any unused medication after the expiration date. NOTE: This sheet is a summary. It may not cover all possible information. If you have questions about this medicine, talk to your doctor, pharmacist, or health care provider.  2024 Elsevier/Gold Standard (2021-02-06 00:00:00)

## 2023-05-05 ENCOUNTER — Inpatient Hospital Stay: Payer: MEDICAID | Attending: Oncology

## 2023-05-05 VITALS — BP 134/92 | HR 88 | Temp 98.1°F | Resp 18

## 2023-05-05 DIAGNOSIS — E538 Deficiency of other specified B group vitamins: Secondary | ICD-10-CM | POA: Insufficient documentation

## 2023-05-05 MED ORDER — CYANOCOBALAMIN 1000 MCG/ML IJ SOLN
1000.0000 ug | Freq: Once | INTRAMUSCULAR | Status: AC
Start: 2023-05-05 — End: 2023-05-05
  Administered 2023-05-05: 1000 ug via INTRAMUSCULAR
  Filled 2023-05-05: qty 1

## 2023-06-02 ENCOUNTER — Inpatient Hospital Stay: Payer: MEDICAID

## 2023-06-06 ENCOUNTER — Ambulatory Visit: Payer: MEDICAID

## 2023-06-12 ENCOUNTER — Inpatient Hospital Stay: Payer: MEDICAID | Attending: Oncology

## 2023-06-12 VITALS — BP 142/66 | HR 85 | Temp 98.0°F | Resp 18 | Wt 143.1 lb

## 2023-06-12 DIAGNOSIS — E538 Deficiency of other specified B group vitamins: Secondary | ICD-10-CM | POA: Diagnosis present

## 2023-06-12 MED ORDER — CYANOCOBALAMIN 1000 MCG/ML IJ SOLN
1000.0000 ug | Freq: Once | INTRAMUSCULAR | Status: AC
  Administered 2023-06-12: 1000 ug via INTRAMUSCULAR
  Filled 2023-06-12: qty 1

## 2023-06-30 ENCOUNTER — Ambulatory Visit: Payer: MEDICAID

## 2023-07-15 ENCOUNTER — Inpatient Hospital Stay: Payer: MEDICAID | Attending: Oncology

## 2023-07-15 VITALS — BP 144/69 | HR 93 | Temp 98.1°F | Resp 18 | Ht 67.0 in | Wt 145.0 lb

## 2023-07-15 DIAGNOSIS — E538 Deficiency of other specified B group vitamins: Secondary | ICD-10-CM | POA: Insufficient documentation

## 2023-07-15 MED ORDER — CYANOCOBALAMIN 1000 MCG/ML IJ SOLN
1000.0000 ug | Freq: Once | INTRAMUSCULAR | Status: AC
Start: 2023-07-15 — End: 2023-07-15
  Administered 2023-07-15: 1000 ug via INTRAMUSCULAR
  Filled 2023-07-15: qty 1

## 2023-07-15 NOTE — Patient Instructions (Signed)
 Vitamin B12 Injection What is this medication? Vitamin B12 (VAHY tuh min B12) prevents and treats low vitamin B12 levels in your body. It is used in people who do not get enough vitamin B12 from their diet or when their digestive tract does not absorb enough. Vitamin B12 plays an important role in maintaining the health of your nervous system and red blood cells. This medicine may be used for other purposes; ask your health care provider or pharmacist if you have questions. COMMON BRAND NAME(S): B-12 Compliance Kit, B-12 Injection Kit, Cyomin, Dodex , LA-12, Nutri-Twelve, Physicians EZ Use B-12, Primabalt, Vitamin Deficiency Injectable System - B12 What should I tell my care team before I take this medication? They need to know if you have any of these conditions: Kidney disease Leber's disease Megaloblastic anemia An unusual or allergic reaction to cyanocobalamin , cobalt, other medications, foods, dyes, or preservatives Pregnant or trying to get pregnant Breast-feeding How should I use this medication? This medication is injected into a muscle or deeply under the skin. It is usually given in a clinic or care team's office. However, your care team may teach you how to inject yourself. Follow all instructions. Talk to your care team about the use of this medication in children. Special care may be needed. Overdosage: If you think you have taken too much of this medicine contact a poison control center or emergency room at once. NOTE: This medicine is only for you. Do not share this medicine with others. What if I miss a dose? If you are given your dose at a clinic or care team's office, call to reschedule your appointment. If you give your own injections, and you miss a dose, take it as soon as you can. If it is almost time for your next dose, take only that dose. Do not take double or extra doses. What may interact with this medication? Alcohol Colchicine This list may not describe all possible  interactions. Give your health care provider a list of all the medicines, herbs, non-prescription drugs, or dietary supplements you use. Also tell them if you smoke, drink alcohol, or use illegal drugs. Some items may interact with your medicine. What should I watch for while using this medication? Visit your care team regularly. You may need blood work done while you are taking this medication. You may need to follow a special diet. Talk to your care team. Limit your alcohol intake and avoid smoking to get the best benefit. What side effects may I notice from receiving this medication? Side effects that you should report to your care team as soon as possible: Allergic reactions--skin rash, itching, hives, swelling of the face, lips, tongue, or throat Swelling of the ankles, hands, or feet Trouble breathing Side effects that usually do not require medical attention (report to your care team if they continue or are bothersome): Diarrhea This list may not describe all possible side effects. Call your doctor for medical advice about side effects. You may report side effects to FDA at 1-800-FDA-1088. Where should I keep my medication? Keep out of the reach of children. Store at room temperature between 15 and 30 degrees C (59 and 85 degrees F). Protect from light. Throw away any unused medication after the expiration date. NOTE: This sheet is a summary. It may not cover all possible information. If you have questions about this medicine, talk to your doctor, pharmacist, or health care provider.  2024 Elsevier/Gold Standard (2021-02-06 00:00:00)

## 2023-07-28 ENCOUNTER — Ambulatory Visit: Payer: MEDICAID

## 2023-08-12 ENCOUNTER — Inpatient Hospital Stay: Payer: MEDICAID

## 2023-08-15 ENCOUNTER — Inpatient Hospital Stay: Payer: MEDICAID | Attending: Oncology

## 2023-08-15 VITALS — BP 124/75 | HR 88 | Temp 98.2°F | Resp 16 | Wt 137.1 lb

## 2023-08-15 DIAGNOSIS — E538 Deficiency of other specified B group vitamins: Secondary | ICD-10-CM | POA: Insufficient documentation

## 2023-08-15 MED ORDER — CYANOCOBALAMIN 1000 MCG/ML IJ SOLN
1000.0000 ug | Freq: Once | INTRAMUSCULAR | Status: AC
  Administered 2023-08-15: 1000 ug via INTRAMUSCULAR
  Filled 2023-08-15: qty 1

## 2023-08-15 NOTE — Patient Instructions (Signed)
 Vitamin B12 Injection What is this medication? Vitamin B12 (VAHY tuh min B12) prevents and treats low vitamin B12 levels in your body. It is used in people who do not get enough vitamin B12 from their diet or when their digestive tract does not absorb enough. Vitamin B12 plays an important role in maintaining the health of your nervous system and red blood cells. This medicine may be used for other purposes; ask your health care provider or pharmacist if you have questions. COMMON BRAND NAME(S): B-12 Compliance Kit, B-12 Injection Kit, Cyomin, Dodex, LA-12, Nutri-Twelve, Physicians EZ Use B-12, Primabalt, Vitamin Deficiency Injectable System - B12 What should I tell my care team before I take this medication? They need to know if you have any of these conditions: Kidney disease Leber's disease Megaloblastic anemia An unusual or allergic reaction to cyanocobalamin, cobalt, other medications, foods, dyes, or preservatives Pregnant or trying to get pregnant Breast-feeding How should I use this medication? This medication is injected into a muscle or deeply under the skin. It is usually given in a clinic or care team's office. However, your care team may teach you how to inject yourself. Follow all instructions. Talk to your care team about the use of this medication in children. Special care may be needed. Overdosage: If you think you have taken too much of this medicine contact a poison control center or emergency room at once. NOTE: This medicine is only for you. Do not share this medicine with others. What if I miss a dose? If you are given your dose at a clinic or care team's office, call to reschedule your appointment. If you give your own injections, and you miss a dose, take it as soon as you can. If it is almost time for your next dose, take only that dose. Do not take double or extra doses. What may interact with this medication? Alcohol Colchicine This list may not describe all possible  interactions. Give your health care provider a list of all the medicines, herbs, non-prescription drugs, or dietary supplements you use. Also tell them if you smoke, drink alcohol, or use illegal drugs. Some items may interact with your medicine. What should I watch for while using this medication? Visit your care team regularly. You may need blood work done while you are taking this medication. You may need to follow a special diet. Talk to your care team. Limit your alcohol intake and avoid smoking to get the best benefit. What side effects may I notice from receiving this medication? Side effects that you should report to your care team as soon as possible: Allergic reactions--skin rash, itching, hives, swelling of the face, lips, tongue, or throat Swelling of the ankles, hands, or feet Trouble breathing Side effects that usually do not require medical attention (report to your care team if they continue or are bothersome): Diarrhea This list may not describe all possible side effects. Call your doctor for medical advice about side effects. You may report side effects to FDA at 1-800-FDA-1088. Where should I keep my medication? Keep out of the reach of children. Store at room temperature between 15 and 30 degrees C (59 and 85 degrees F). Protect from light. Throw away any unused medication after the expiration date. NOTE: This sheet is a summary. It may not cover all possible information. If you have questions about this medicine, talk to your doctor, pharmacist, or health care provider.  2024 Elsevier/Gold Standard (2021-02-06 00:00:00)

## 2023-08-25 ENCOUNTER — Ambulatory Visit: Payer: MEDICAID

## 2023-09-12 ENCOUNTER — Ambulatory Visit: Payer: MEDICAID

## 2023-09-15 ENCOUNTER — Inpatient Hospital Stay: Payer: MEDICAID | Attending: Oncology

## 2023-09-15 VITALS — BP 135/76 | HR 86 | Temp 98.1°F | Resp 16 | Ht 67.0 in | Wt 135.0 lb

## 2023-09-15 DIAGNOSIS — E538 Deficiency of other specified B group vitamins: Secondary | ICD-10-CM | POA: Insufficient documentation

## 2023-09-15 MED ORDER — CYANOCOBALAMIN 1000 MCG/ML IJ SOLN
1000.0000 ug | Freq: Once | INTRAMUSCULAR | Status: AC
  Administered 2023-09-15: 1000 ug via INTRAMUSCULAR
  Filled 2023-09-15: qty 1

## 2023-09-15 NOTE — Patient Instructions (Signed)
 Vitamin B12 Injection What is this medication? Vitamin B12 (VAHY tuh min B12) prevents and treats low vitamin B12 levels in your body. It is used in people who do not get enough vitamin B12 from their diet or when their digestive tract does not absorb enough. Vitamin B12 plays an important role in maintaining the health of your nervous system and red blood cells. This medicine may be used for other purposes; ask your health care provider or pharmacist if you have questions. COMMON BRAND NAME(S): B-12 Compliance Kit, B-12 Injection Kit, Cyomin, Dodex, LA-12, Nutri-Twelve, Physicians EZ Use B-12, Primabalt, Vitamin Deficiency Injectable System - B12 What should I tell my care team before I take this medication? They need to know if you have any of these conditions: Kidney disease Leber's disease Megaloblastic anemia An unusual or allergic reaction to cyanocobalamin, cobalt, other medications, foods, dyes, or preservatives Pregnant or trying to get pregnant Breast-feeding How should I use this medication? This medication is injected into a muscle or deeply under the skin. It is usually given in a clinic or care team's office. However, your care team may teach you how to inject yourself. Follow all instructions. Talk to your care team about the use of this medication in children. Special care may be needed. Overdosage: If you think you have taken too much of this medicine contact a poison control center or emergency room at once. NOTE: This medicine is only for you. Do not share this medicine with others. What if I miss a dose? If you are given your dose at a clinic or care team's office, call to reschedule your appointment. If you give your own injections, and you miss a dose, take it as soon as you can. If it is almost time for your next dose, take only that dose. Do not take double or extra doses. What may interact with this medication? Alcohol Colchicine This list may not describe all possible  interactions. Give your health care provider a list of all the medicines, herbs, non-prescription drugs, or dietary supplements you use. Also tell them if you smoke, drink alcohol, or use illegal drugs. Some items may interact with your medicine. What should I watch for while using this medication? Visit your care team regularly. You may need blood work done while you are taking this medication. You may need to follow a special diet. Talk to your care team. Limit your alcohol intake and avoid smoking to get the best benefit. What side effects may I notice from receiving this medication? Side effects that you should report to your care team as soon as possible: Allergic reactions--skin rash, itching, hives, swelling of the face, lips, tongue, or throat Swelling of the ankles, hands, or feet Trouble breathing Side effects that usually do not require medical attention (report to your care team if they continue or are bothersome): Diarrhea This list may not describe all possible side effects. Call your doctor for medical advice about side effects. You may report side effects to FDA at 1-800-FDA-1088. Where should I keep my medication? Keep out of the reach of children. Store at room temperature between 15 and 30 degrees C (59 and 85 degrees F). Protect from light. Throw away any unused medication after the expiration date. NOTE: This sheet is a summary. It may not cover all possible information. If you have questions about this medicine, talk to your doctor, pharmacist, or health care provider.  2024 Elsevier/Gold Standard (2021-02-06 00:00:00)

## 2023-09-22 ENCOUNTER — Ambulatory Visit: Payer: MEDICAID

## 2023-09-30 LAB — HM MAMMOGRAPHY

## 2023-10-15 ENCOUNTER — Inpatient Hospital Stay: Payer: MEDICAID | Attending: Oncology

## 2023-10-15 VITALS — BP 156/81 | HR 94 | Temp 98.4°F | Resp 18 | Ht 67.0 in | Wt 127.0 lb

## 2023-10-15 DIAGNOSIS — E538 Deficiency of other specified B group vitamins: Secondary | ICD-10-CM | POA: Diagnosis present

## 2023-10-15 MED ORDER — CYANOCOBALAMIN 1000 MCG/ML IJ SOLN
1000.0000 ug | Freq: Once | INTRAMUSCULAR | Status: AC
  Administered 2023-10-15: 1000 ug via INTRAMUSCULAR
  Filled 2023-10-15: qty 1

## 2023-10-15 NOTE — Patient Instructions (Signed)
 Vitamin B12 Injection What is this medication? Vitamin B12 (VAHY tuh min B12) prevents and treats low vitamin B12 levels in your body. It is used in people who do not get enough vitamin B12 from their diet or when their digestive tract does not absorb enough. Vitamin B12 plays an important role in maintaining the health of your nervous system and red blood cells. This medicine may be used for other purposes; ask your health care provider or pharmacist if you have questions. COMMON BRAND NAME(S): B-12 Compliance Kit, B-12 Injection Kit, Cyomin, Dodex, LA-12, Nutri-Twelve, Physicians EZ Use B-12, Primabalt, Vitamin Deficiency Injectable System - B12 What should I tell my care team before I take this medication? They need to know if you have any of these conditions: Kidney disease Leber's disease Megaloblastic anemia An unusual or allergic reaction to cyanocobalamin, cobalt, other medications, foods, dyes, or preservatives Pregnant or trying to get pregnant Breast-feeding How should I use this medication? This medication is injected into a muscle or deeply under the skin. It is usually given in a clinic or care team's office. However, your care team may teach you how to inject yourself. Follow all instructions. Talk to your care team about the use of this medication in children. Special care may be needed. Overdosage: If you think you have taken too much of this medicine contact a poison control center or emergency room at once. NOTE: This medicine is only for you. Do not share this medicine with others. What if I miss a dose? If you are given your dose at a clinic or care team's office, call to reschedule your appointment. If you give your own injections, and you miss a dose, take it as soon as you can. If it is almost time for your next dose, take only that dose. Do not take double or extra doses. What may interact with this medication? Alcohol Colchicine This list may not describe all possible  interactions. Give your health care provider a list of all the medicines, herbs, non-prescription drugs, or dietary supplements you use. Also tell them if you smoke, drink alcohol, or use illegal drugs. Some items may interact with your medicine. What should I watch for while using this medication? Visit your care team regularly. You may need blood work done while you are taking this medication. You may need to follow a special diet. Talk to your care team. Limit your alcohol intake and avoid smoking to get the best benefit. What side effects may I notice from receiving this medication? Side effects that you should report to your care team as soon as possible: Allergic reactions--skin rash, itching, hives, swelling of the face, lips, tongue, or throat Swelling of the ankles, hands, or feet Trouble breathing Side effects that usually do not require medical attention (report to your care team if they continue or are bothersome): Diarrhea This list may not describe all possible side effects. Call your doctor for medical advice about side effects. You may report side effects to FDA at 1-800-FDA-1088. Where should I keep my medication? Keep out of the reach of children. Store at room temperature between 15 and 30 degrees C (59 and 85 degrees F). Protect from light. Throw away any unused medication after the expiration date. NOTE: This sheet is a summary. It may not cover all possible information. If you have questions about this medicine, talk to your doctor, pharmacist, or health care provider.  2024 Elsevier/Gold Standard (2021-02-06 00:00:00)

## 2023-10-20 ENCOUNTER — Ambulatory Visit: Payer: MEDICAID

## 2023-10-23 ENCOUNTER — Telehealth: Payer: Self-pay | Admitting: Hematology and Oncology

## 2023-10-23 NOTE — Telephone Encounter (Signed)
 Jill Williams returned my call regarding her weight loss. She states she has been trying to lose weight due to increased leg pain when she weighs more than 130#. Bilateral screening mammogram in April 2025 was negative. CT chest lung cancer screening through Dr. Vallerie Gave in March 2024 was negative. Advised her we do recommend she continue this yearly until she has quit smoking for 15 years.

## 2023-11-17 ENCOUNTER — Inpatient Hospital Stay: Payer: MEDICAID | Attending: Oncology

## 2023-11-17 ENCOUNTER — Ambulatory Visit: Payer: MEDICAID

## 2023-11-17 VITALS — BP 134/78 | HR 95 | Temp 98.1°F | Resp 18

## 2023-11-17 DIAGNOSIS — E538 Deficiency of other specified B group vitamins: Secondary | ICD-10-CM | POA: Insufficient documentation

## 2023-11-17 MED ORDER — CYANOCOBALAMIN 1000 MCG/ML IJ SOLN
1000.0000 ug | Freq: Once | INTRAMUSCULAR | Status: AC
  Administered 2023-11-17: 1000 ug via INTRAMUSCULAR
  Filled 2023-11-17: qty 1

## 2023-11-17 NOTE — Patient Instructions (Signed)
 Vitamin B12 Deficiency Vitamin B12 deficiency means that your body does not have enough vitamin B12. The body needs this important vitamin: To make red blood cells. To make genes (DNA). To help the nerves work. If you do not have enough vitamin B12 in your body, you can have health problems, such as not having enough red blood cells in the blood (anemia). What are the causes? Not eating enough foods that contain vitamin B12. Not being able to take in (absorb) vitamin B12 from the food that you eat. Certain diseases. A condition in which the body does not make enough of a certain protein. This results in your body not taking in enough vitamin B12. Having a surgery in which part of the stomach or small intestine is taken out. Taking medicines that make it hard for the body to take in vitamin B12. These include: Heartburn medicines. Some medicines that are used to treat diabetes. What increases the risk? Being an older adult. Eating a vegetarian or vegan diet that does not include any foods that come from animals. Not eating enough foods that contain vitamin B12 while you are pregnant. Taking certain medicines. Having alcoholism. What are the signs or symptoms? In some cases, there are no symptoms. If the condition leads to too few blood cells or nerve damage, symptoms can occur, such as: Feeling weak or tired. Not being hungry. Losing feeling (numbness) or tingling in your hands and feet. Redness and burning of the tongue. Feeling sad (depressed). Confusion or memory problems. Trouble walking. If anemia is very bad, symptoms can include: Being short of breath. Being dizzy. Having a very fast heartbeat. How is this treated? Changing the way you eat and drink, such as: Eating more foods that contain vitamin B12. Drinking little or no alcohol. Getting vitamin B12 shots. Taking vitamin B12 supplements by mouth (orally). Your doctor will tell you the dose that is best for you. Follow  these instructions at home: Eating and drinking  Eat foods that come from animals and have a lot of vitamin B12 in them. These include: Meats and poultry. This includes beef, pork, chicken, [place], and organ meats, such as liver. Seafood, such as clams, rainbow trout, salmon, tuna, and haddock. Eggs. Dairy foods such as milk, yogurt, and cheese. Eat breakfast cereals that have vitamin B12 added to them (are fortified). Check the label. The items listed above may not be a complete list of foods and beverages you can eat and drink. Contact a dietitian for more information. Alcohol use Do not drink alcohol if: Your doctor tells you not to drink. You are pregnant, may be pregnant, or are planning to become pregnant. If you drink alcohol: Limit how much you have to: 0-1 drink a day for women. 0-2 drinks a day for men. Know how much alcohol is in your drink. In the [place] one drink equals one 12 oz bottle of beer (355 mL), one 5 oz glass of wine (148 mL), or one 1 oz glass of hard liquor (44 mL). General instructions Get any vitamin B12 shots if told by your doctor. Take supplements only as told by your doctor. Follow the directions. Keep all follow-up visits. Contact a doctor if: Your symptoms come back. Your symptoms get worse or do not get better with treatment. Get help right away if: You have trouble breathing. You have a very fast heartbeat. You have chest pain. You get dizzy. You faint. These symptoms may be an emergency. Get help right away. Call 911.  Do not wait to see if the symptoms will go away. Do not drive yourself to the hospital. Summary Vitamin B12 deficiency means that your body is not getting enough of the vitamin. In some cases, there are no symptoms of this condition. Treatment may include making a change in the way you eat and drink, getting shots, or taking supplements. Eat foods that have vitamin B12 in them. This information is not intended to replace advice  given to you by your health care provider. Make sure you discuss any questions you have with your health care provider. Document Revised: 01/19/2021 Document Reviewed: 01/19/2021 Elsevier Patient Education  2024 [employer].

## 2023-12-15 ENCOUNTER — Ambulatory Visit: Payer: MEDICAID

## 2023-12-17 ENCOUNTER — Inpatient Hospital Stay: Payer: MEDICAID

## 2023-12-23 ENCOUNTER — Inpatient Hospital Stay: Payer: MEDICAID | Attending: Oncology

## 2023-12-23 VITALS — BP 133/77 | HR 75 | Temp 98.6°F | Resp 18

## 2023-12-23 DIAGNOSIS — E538 Deficiency of other specified B group vitamins: Secondary | ICD-10-CM | POA: Insufficient documentation

## 2023-12-23 MED ORDER — CYANOCOBALAMIN 1000 MCG/ML IJ SOLN
1000.0000 ug | Freq: Once | INTRAMUSCULAR | Status: AC
  Administered 2023-12-23: 1000 ug via INTRAMUSCULAR
  Filled 2023-12-23: qty 1

## 2023-12-23 NOTE — Patient Instructions (Signed)
 Vitamin B12 Injection What is this medication? Vitamin B12 (VAHY tuh min B12) prevents and treats low vitamin B12 levels in your body. It is used in people who do not get enough vitamin B12 from their diet or when their digestive tract does not absorb enough. Vitamin B12 plays an important role in maintaining the health of your nervous system and red blood cells. This medicine may be used for other purposes; ask your health care provider or pharmacist if you have questions. COMMON BRAND NAME(S): B-12 Compliance Kit, B-12 Injection Kit, Cyomin, Dodex, LA-12, Nutri-Twelve, Physicians EZ Use B-12, Primabalt, Vitamin Deficiency Injectable System - B12 What should I tell my care team before I take this medication? They need to know if you have any of these conditions: Kidney disease Leber's disease Megaloblastic anemia An unusual or allergic reaction to cyanocobalamin, cobalt, other medications, foods, dyes, or preservatives Pregnant or trying to get pregnant Breast-feeding How should I use this medication? This medication is injected into a muscle or deeply under the skin. It is usually given in a clinic or care team's office. However, your care team may teach you how to inject yourself. Follow all instructions. Talk to your care team about the use of this medication in children. Special care may be needed. Overdosage: If you think you have taken too much of this medicine contact a poison control center or emergency room at once. NOTE: This medicine is only for you. Do not share this medicine with others. What if I miss a dose? If you are given your dose at a clinic or care team's office, call to reschedule your appointment. If you give your own injections, and you miss a dose, take it as soon as you can. If it is almost time for your next dose, take only that dose. Do not take double or extra doses. What may interact with this medication? Alcohol Colchicine This list may not describe all possible  interactions. Give your health care provider a list of all the medicines, herbs, non-prescription drugs, or dietary supplements you use. Also tell them if you smoke, drink alcohol, or use illegal drugs. Some items may interact with your medicine. What should I watch for while using this medication? Visit your care team regularly. You may need blood work done while you are taking this medication. You may need to follow a special diet. Talk to your care team. Limit your alcohol intake and avoid smoking to get the best benefit. What side effects may I notice from receiving this medication? Side effects that you should report to your care team as soon as possible: Allergic reactions--skin rash, itching, hives, swelling of the face, lips, tongue, or throat Swelling of the ankles, hands, or feet Trouble breathing Side effects that usually do not require medical attention (report to your care team if they continue or are bothersome): Diarrhea This list may not describe all possible side effects. Call your doctor for medical advice about side effects. You may report side effects to FDA at 1-800-FDA-1088. Where should I keep my medication? Keep out of the reach of children. Store at room temperature between 15 and 30 degrees C (59 and 85 degrees F). Protect from light. Throw away any unused medication after the expiration date. NOTE: This sheet is a summary. It may not cover all possible information. If you have questions about this medicine, talk to your doctor, pharmacist, or health care provider.  2024 Elsevier/Gold Standard (2021-02-06 00:00:00)

## 2024-01-12 ENCOUNTER — Ambulatory Visit: Payer: MEDICAID

## 2024-01-19 ENCOUNTER — Ambulatory Visit: Payer: MEDICAID

## 2024-01-20 ENCOUNTER — Inpatient Hospital Stay: Payer: MEDICAID | Attending: Oncology

## 2024-01-20 VITALS — BP 140/74 | HR 95 | Temp 98.2°F | Resp 18 | Ht 67.0 in | Wt 120.0 lb

## 2024-01-20 DIAGNOSIS — E538 Deficiency of other specified B group vitamins: Secondary | ICD-10-CM | POA: Insufficient documentation

## 2024-01-20 MED ORDER — CYANOCOBALAMIN 1000 MCG/ML IJ SOLN
1000.0000 ug | Freq: Once | INTRAMUSCULAR | Status: AC
  Administered 2024-01-20 (×2): 1000 ug via INTRAMUSCULAR
  Filled 2024-01-20: qty 1

## 2024-01-20 NOTE — Patient Instructions (Signed)
 Vitamin B12 Deficiency Vitamin B12 deficiency means that your body does not have enough vitamin B12. The body needs this important vitamin: To make red blood cells. To make genes (DNA). To help the nerves work. If you do not have enough vitamin B12 in your body, you can have health problems, such as not having enough red blood cells in the blood (anemia). What are the causes? Not eating enough foods that contain vitamin B12. Not being able to take in (absorb) vitamin B12 from the food that you eat. Certain diseases. A condition in which the body does not make enough of a certain protein. This results in your body not taking in enough vitamin B12. Having a surgery in which part of the stomach or small intestine is taken out. Taking medicines that make it hard for the body to take in vitamin B12. These include: Heartburn medicines. Some medicines that are used to treat diabetes. What increases the risk? Being an older adult. Eating a vegetarian or vegan diet that does not include any foods that come from animals. Not eating enough foods that contain vitamin B12 while you are pregnant. Taking certain medicines. Having alcoholism. What are the signs or symptoms? In some cases, there are no symptoms. If the condition leads to too few blood cells or nerve damage, symptoms can occur, such as: Feeling weak or tired. Not being hungry. Losing feeling (numbness) or tingling in your hands and feet. Redness and burning of the tongue. Feeling sad (depressed). Confusion or memory problems. Trouble walking. If anemia is very bad, symptoms can include: Being short of breath. Being dizzy. Having a very fast heartbeat. How is this treated? Changing the way you eat and drink, such as: Eating more foods that contain vitamin B12. Drinking little or no alcohol. Getting vitamin B12 shots. Taking vitamin B12 supplements by mouth (orally). Your doctor will tell you the dose that is best for you. Follow  these instructions at home: Eating and drinking  Eat foods that come from animals and have a lot of vitamin B12 in them. These include: Meats and poultry. This includes beef, pork, chicken, [place], and organ meats, such as liver. Seafood, such as clams, rainbow trout, salmon, tuna, and haddock. Eggs. Dairy foods such as milk, yogurt, and cheese. Eat breakfast cereals that have vitamin B12 added to them (are fortified). Check the label. The items listed above may not be a complete list of foods and beverages you can eat and drink. Contact a dietitian for more information. Alcohol use Do not drink alcohol if: Your doctor tells you not to drink. You are pregnant, may be pregnant, or are planning to become pregnant. If you drink alcohol: Limit how much you have to: 0-1 drink a day for women. 0-2 drinks a day for men. Know how much alcohol is in your drink. In the [place] one drink equals one 12 oz bottle of beer (355 mL), one 5 oz glass of wine (148 mL), or one 1 oz glass of hard liquor (44 mL). General instructions Get any vitamin B12 shots if told by your doctor. Take supplements only as told by your doctor. Follow the directions. Keep all follow-up visits. Contact a doctor if: Your symptoms come back. Your symptoms get worse or do not get better with treatment. Get help right away if: You have trouble breathing. You have a very fast heartbeat. You have chest pain. You get dizzy. You faint. These symptoms may be an emergency. Get help right away. Call 911.  Do not wait to see if the symptoms will go away. Do not drive yourself to the hospital. Summary Vitamin B12 deficiency means that your body is not getting enough of the vitamin. In some cases, there are no symptoms of this condition. Treatment may include making a change in the way you eat and drink, getting shots, or taking supplements. Eat foods that have vitamin B12 in them. This information is not intended to replace advice  given to you by your health care provider. Make sure you discuss any questions you have with your health care provider. Document Revised: 01/19/2021 Document Reviewed: 01/19/2021 Elsevier Patient Education  2024 [employer].

## 2024-02-10 ENCOUNTER — Ambulatory Visit: Payer: MEDICAID

## 2024-02-20 ENCOUNTER — Telehealth: Payer: Self-pay | Admitting: Oncology

## 2024-02-20 ENCOUNTER — Inpatient Hospital Stay: Payer: MEDICAID | Attending: Oncology | Admitting: Oncology

## 2024-02-20 ENCOUNTER — Inpatient Hospital Stay: Payer: MEDICAID

## 2024-02-20 VITALS — BP 154/97 | HR 84 | Temp 98.2°F | Resp 18 | Ht 67.0 in | Wt 117.6 lb

## 2024-02-20 DIAGNOSIS — F17200 Nicotine dependence, unspecified, uncomplicated: Secondary | ICD-10-CM | POA: Diagnosis not present

## 2024-02-20 DIAGNOSIS — G8194 Hemiplegia, unspecified affecting left nondominant side: Secondary | ICD-10-CM | POA: Diagnosis not present

## 2024-02-20 DIAGNOSIS — C20 Malignant neoplasm of rectum: Secondary | ICD-10-CM | POA: Diagnosis present

## 2024-02-20 DIAGNOSIS — Z801 Family history of malignant neoplasm of trachea, bronchus and lung: Secondary | ICD-10-CM | POA: Insufficient documentation

## 2024-02-20 DIAGNOSIS — Z803 Family history of malignant neoplasm of breast: Secondary | ICD-10-CM | POA: Insufficient documentation

## 2024-02-20 DIAGNOSIS — E538 Deficiency of other specified B group vitamins: Secondary | ICD-10-CM | POA: Diagnosis not present

## 2024-02-20 DIAGNOSIS — Z8673 Personal history of transient ischemic attack (TIA), and cerebral infarction without residual deficits: Secondary | ICD-10-CM | POA: Diagnosis not present

## 2024-02-20 DIAGNOSIS — Z933 Colostomy status: Secondary | ICD-10-CM | POA: Diagnosis not present

## 2024-02-20 DIAGNOSIS — Z809 Family history of malignant neoplasm, unspecified: Secondary | ICD-10-CM | POA: Insufficient documentation

## 2024-02-20 DIAGNOSIS — Z79899 Other long term (current) drug therapy: Secondary | ICD-10-CM | POA: Insufficient documentation

## 2024-02-20 LAB — CMP (CANCER CENTER ONLY)
ALT: 7 U/L (ref 0–44)
AST: 14 U/L — ABNORMAL LOW (ref 15–41)
Albumin: 4 g/dL (ref 3.5–5.0)
Alkaline Phosphatase: 120 U/L (ref 38–126)
Anion gap: 12 (ref 5–15)
BUN: 16 mg/dL (ref 6–20)
CO2: 23 mmol/L (ref 22–32)
Calcium: 9.6 mg/dL (ref 8.9–10.3)
Chloride: 107 mmol/L (ref 98–111)
Creatinine: 0.81 mg/dL (ref 0.44–1.00)
GFR, Estimated: >60 mL/min (ref >60)
Glucose, Bld: 84 mg/dL (ref 70–99)
Potassium: 4.3 mmol/L (ref 3.5–5.1)
Sodium: 142 mmol/L (ref 135–145)
Total Bilirubin: 0.4 mg/dL (ref 0.0–1.2)
Total Protein: 7 g/dL (ref 6.5–8.1)

## 2024-02-20 LAB — CBC WITH DIFFERENTIAL (CANCER CENTER ONLY)
Abs Immature Granulocytes: 0.02 K/uL (ref 0.00–0.07)
Basophils Absolute: 0.1 K/uL (ref 0.0–0.1)
Basophils Relative: 1 %
Eosinophils Absolute: 0.2 K/uL (ref 0.0–0.5)
Eosinophils Relative: 4 %
HCT: 48.2 % — ABNORMAL HIGH (ref 36.0–46.0)
Hemoglobin: 15.7 g/dL — ABNORMAL HIGH (ref 12.0–15.0)
Immature Granulocytes: 0 %
Lymphocytes Relative: 22 %
Lymphs Abs: 1 K/uL (ref 0.7–4.0)
MCH: 30.3 pg (ref 26.0–34.0)
MCHC: 32.6 g/dL (ref 30.0–36.0)
MCV: 92.9 fL (ref 80.0–100.0)
Monocytes Absolute: 0.3 K/uL (ref 0.1–1.0)
Monocytes Relative: 7 %
Neutro Abs: 3 K/uL (ref 1.7–7.7)
Neutrophils Relative %: 66 %
Platelet Count: 236 K/uL (ref 150–400)
RBC: 5.19 MIL/uL — ABNORMAL HIGH (ref 3.87–5.11)
RDW: 13.8 % (ref 11.5–15.5)
WBC Count: 4.6 K/uL (ref 4.0–10.5)
nRBC: 0 % (ref 0.0–0.2)

## 2024-02-20 NOTE — Telephone Encounter (Signed)
 Patient has been scheduled for follow-up visit per 02/20/24 LOS.  Pt given an appt calendar with date and time.

## 2024-02-20 NOTE — Progress Notes (Signed)
 [facility]  [address] [place],  [place]  [address] [phone]   Clinic Day: 02/20/24  Referring physician: Magdaline Debby HERO, MD   CHIEF COMPLAINT:  CC: Stage IIA adenocarcinoma of the rectum  Current Treatment:  Surveillance  HISTORY OF PRESENT ILLNESS:  Jill Williams is a 53 y.o. 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  She presented with intermittent rectal bleeding as well as chronic diarrhea for at least 6 months.  Colonoscopy revealed a mass in the right posterior wall about 3 cm from the anal verge extending to 8 cm above the anal verge, encompassing more than half of the mucosal wall.  Biopsy revealed invasive adenocarcinoma.  Neoadjuvant chemoradiation was recommended, to be followed by adjuvant FOLFOX postop.  CT imaging revealed a 6 cm annual lower rectal mass with a small right perirectal node measuring 5 mm and a left perirectal node measuring 1.8 cm.  There was a 1 cm calcified right liver lesion felt to be benign.  There were scattered subcentimeter bilateral lung nodules, the largest measuring 6 mm.  PET revealed an extensive hypermetabolic rectal mass with an SUV of 12.5.  The left pelvic sidewall lymph node had decreased to 6.5 mm and was not hypermetabolic.  The right 5 mm perirectal lymph node was not hypermetabolic.  The lung nodules were not hypermetabolic, but continued follow-up was recommended.  She received neoadjuvant chemoradiation with infusional 5 fluorouracil week 1 and week 5 and started treatment on March 16th.  She had severe chronic diarrhea, but continued daily radiation.   She has a history of a stroke in 04-01-14 which has left her with left upper extremity paraplegia and left lower extremity weakness and foot drop.  She requires a brace for her left ankle.  She is status post right endarterectomy.  She also has hypertension, hyperlipidemia, peripheral vascular disease, osteoarthritis,  gastroesophageal reflux disease, and COPD.  Chest x-ray done December 30th clearly showed hyperinflation.  Unfortunately, she continues to smoke.  She has not had further menstrual periods since her stroke. She will eventually require a left carotid endarterectomy.  Due to her having rectal cancer at age 45, she met criteria for testing for hereditary colorectal cancer, but she initially declined.  Review of her family history reveals her father had lung cancer at an unknown age, a maternal aunt had breast cancer at age 87, her maternal grandmother had an unknown cancer at an unknown age and a paternal aunt had breast cancer at an unknown age.  Myriad myRisk Hereditary Cancer Panel test did not reveal any clinically significant mutation or variant of uncertain significance.  Her lifetime breast cancer risk was estimated at 7.8%.  She underwent surgical resection in July 2020 and was found to have an excellent response to neoadjuvant chemoradiation.  Pathology revealed scant residual foci of adenocarcinoma with the largest foci measuring 0.4 cm.  Eleven lymph nodes were negative for metastasis.  She has had a lot of difficulty with the colostomy bags and ended up in a rehab facility during her postop chemotherapy.  She was recommended for adjuvant FOLFOX chemotherapy.  Her son died in 04/01/2024 in a shooting accident and she stopped eating and drinking for 2 weeks.  She was then admitted to the hospital with a sodium of 166 and was in for several days of IV fluids.  We felt this was from dehydration and malnutrition.  MRI brain, did not reveal acute  findings, the massive prior stroke with encephalomalacia was seen.  She had been deteriorating with weight loss, anorexia and depression.  She finally agreed to take trazodone 75 mg daily.  She was severely weak.  We discontinued chemotherapy after 5 cycles due to her performance status.  However, her long term prognosis is still good.  She is on  B12 supplementation.   She underwent colostomy reversal on January 21st at [facility] with Dr. Bernarda Ned.    INTERVAL HISTORY:  Jill Williams is here for follow up for her stage IIA adenocarcinoma of the rectum. She was diagnosed in January, 2020 and treated with neoadjuvant chemoradiation followed by surgery and later reversal of her ostomy. Patient states that she feels good and has no complaints of pain. She has lost a significant amount of weight, but she states that she has a great appetite. She continues monthly B12 injections without difficulty for her pernicious anemia. She has a WBC of 4.6, an elevated hemoglobin of 15.7 up from 14.3, and a platelet count of 236,000. Her CMP is normal. She had her last colonoscopy with Dr. Larene in 2023. She will visit Dr. Magdaline on 03/08/2024. I will see her back in 1 year with CBC, CMP, and CEA. We will continue monthly B12 injections.  She denies fever, chills, night sweats, or other signs of infection. She denies cardiorespiratory and gastrointestinal issues. She  denies pain. Her appetite is great and Her weight has decreased 27 pounds over last year.  REVIEW OF SYSTEMS:  Review of Systems  Constitutional:  Positive for unexpected weight change (weight loss, no appetite change). Negative for appetite change, chills, diaphoresis, fatigue and fever.  HENT:  Negative.  Negative for hearing loss, lump/mass, mouth sores, nosebleeds, sore throat, tinnitus, trouble swallowing and voice change.   Eyes: Negative.  Negative for eye problems and icterus.  Respiratory:  Negative for chest tightness, cough, hemoptysis, shortness of breath and wheezing.   Cardiovascular:  Negative for chest pain, leg swelling and palpitations.  Gastrointestinal:  Positive for diarrhea (occassional). Negative for abdominal distention, abdominal pain, blood in stool, constipation, nausea, rectal pain and vomiting.  Endocrine: Negative.   Genitourinary: Negative.  Negative for bladder incontinence,  difficulty urinating, dyspareunia, dysuria, frequency, hematuria, menstrual problem, nocturia, pelvic pain, vaginal bleeding and vaginal discharge.   Musculoskeletal:  Positive for gait problem. Negative for arthralgias, back pain, flank pain, myalgias, neck pain and neck stiffness.  Skin: Negative.  Negative for itching, rash and wound.  Neurological:  Positive for extremity weakness (chronic of the right side from prior stroke) and gait problem. Negative for dizziness, headaches, light-headedness, numbness, seizures and speech difficulty.       Left hemiparesis with left foot drop  Hematological: Negative.  Negative for adenopathy. Does not bruise/bleed easily.  Psychiatric/Behavioral:  Negative for confusion, decreased concentration, depression, sleep disturbance and suicidal ideas. The patient is nervous/anxious.     VITALS:  Blood pressure (!) 154/97, pulse 84, temperature 98.2 F (36.8 C), temperature source Oral, resp. rate 18, height 5' 7 (1.702 m), weight 117 lb 9.6 oz (53.3 kg), last menstrual period 07/18/2014, SpO2 100%.  Wt Readings from Last 3 Encounters:  02/20/24 117 lb 9.6 oz (53.3 kg)  01/20/24 120 lb (54.4 kg)  10/15/23 127 lb (57.6 kg)    Body mass index is 18.42 kg/m.  Performance status (ECOG): 1 - Symptomatic but completely ambulatory  PHYSICAL EXAM:  Physical Exam Vitals and nursing note reviewed.  Constitutional:      General:  She is not in acute distress.    Appearance: Normal appearance. She is normal weight. She is not ill-appearing, toxic-appearing or diaphoretic.  HENT:     Head: Normocephalic and atraumatic.     Right Ear: Tympanic membrane, ear canal and external ear normal. There is no impacted cerumen.     Left Ear: Tympanic membrane, ear canal and external ear normal. There is no impacted cerumen.     Nose: Nose normal. No congestion or rhinorrhea.     Mouth/Throat:     Mouth: Mucous membranes are moist.     Pharynx: Oropharynx is clear. No  oropharyngeal exudate or posterior oropharyngeal erythema.  Eyes:     General: No scleral icterus.       Right eye: No discharge.        Left eye: No discharge.     Extraocular Movements: Extraocular movements intact.     Conjunctiva/sclera: Conjunctivae normal.     Pupils: Pupils are equal, round, and reactive to light.  Neck:     Vascular: No carotid bruit.  Cardiovascular:     Rate and Rhythm: Normal rate and regular rhythm.     Pulses: Normal pulses.     Heart sounds: Normal heart sounds. No murmur heard.    No friction rub. No gallop.  Pulmonary:     Effort: Pulmonary effort is normal. No respiratory distress.     Breath sounds: No stridor. No wheezing (mild, occasional), rhonchi or rales.  Chest:     Chest wall: No tenderness.  Abdominal:     General: Bowel sounds are normal. There is no distension.     Palpations: Abdomen is soft. There is no hepatomegaly, splenomegaly or mass.     Tenderness: There is no abdominal tenderness. There is no right CVA tenderness, left CVA tenderness, guarding or rebound.     Hernia: No hernia is present.     Comments: Well healed scars of the lower abdomen and right mid abdomen.   Musculoskeletal:        General: No swelling, tenderness, deformity or signs of injury. Normal range of motion.     Cervical back: Normal range of motion and neck supple. No rigidity or tenderness.     Right lower leg: No edema.     Left lower leg: No edema.  Lymphadenopathy:     Cervical: No cervical adenopathy.     Right cervical: No superficial, deep or posterior cervical adenopathy.    Left cervical: No superficial, deep or posterior cervical adenopathy.     Upper Body:     Right upper body: No supraclavicular, axillary or pectoral adenopathy.     Left upper body: No supraclavicular, axillary or pectoral adenopathy.  Skin:    General: Skin is warm and dry.     Coloration: Skin is not jaundiced or pale.     Findings: No bruising, erythema, lesion or rash.   Neurological:     Mental Status: She is alert and oriented to person, place, and time. Mental status is at baseline.     Cranial Nerves: No cranial nerve deficit.     Sensory: No sensory deficit.     Motor: No weakness.     Coordination: Coordination normal.     Gait: Gait abnormal.     Deep Tendon Reflexes: Reflexes normal.     Comments: Left hemiparesis with left foot drop, with a brace in place.   Psychiatric:        Mood and Affect: Mood normal.  Behavior: Behavior normal.        Thought Content: Thought content normal.        Judgment: Judgment normal.    LABS:      Latest Ref Rng & Units 02/20/2024   10:49 AM 02/21/2023    9:57 AM 08/23/2022    9:04 AM  CBC  WBC 4.0 - 10.5 K/uL 4.6  7.5  6.8   Hemoglobin 12.0 - 15.0 g/dL 84.2  85.6  86.2   Hematocrit 36.0 - 46.0 % 48.2  43.8  43.5   Platelets 150 - 400 K/uL 236  236  303       Latest Ref Rng & Units 02/20/2024   10:49 AM 02/21/2023    9:57 AM 08/23/2022    9:04 AM  CMP  Glucose 70 - 99 mg/dL 84  88  899   BUN 6 - 20 mg/dL 16  12  14    Creatinine 0.44 - 1.00 mg/dL 9.18  9.05  9.15   Sodium 135 - 145 mmol/L 142  137  137   Potassium 3.5 - 5.1 mmol/L 4.3  3.6  3.7   Chloride 98 - 111 mmol/L 107  105  104   CO2 22 - 32 mmol/L 23  21  24    Calcium  8.9 - 10.3 mg/dL 9.6  9.0  9.0   Total Protein 6.5 - 8.1 g/dL 7.0  7.3  7.5   Total Bilirubin 0.0 - 1.2 mg/dL 0.4  0.4  0.2   Alkaline Phos 38 - 126 U/L 120  137  124   AST 15 - 41 U/L 14  14  21    ALT 0 - 44 U/L 7  12  23      Lab Results  Component Value Date   CEA1 1.6 02/21/2023   /  CEA  Date Value Ref Range Status  02/21/2023 1.6 0.0 - 4.7 ng/mL Final    Comment:    (NOTE)                             Nonsmokers          <3.9                             Smokers             <5.6 [employer] Electrochemiluminescence Immunoassay (ECLIA) Values obtained with different assay methods or kits cannot be used interchangeably.  Results cannot  be interpreted as absolute evidence of the presence or absence of malignant disease. Performed At: [facility] [address] [place], [place] [id number] Jennette Shorter MD Ey:[phone]     STUDIES:  Exam:08/23/2022 CT Chest without Contrast Low-Dose for Lung Cancer Screening Impression: Lung-RADS 1, negative. Continue annual screening with low-dose chest CT without contrast in 12 months. Age advanced coronary artery atherosclerosis. Recommend assessment of coronary risk factors. Pneumobilia, likely related to prior sphincterotomy. Aortic Atherosclerosis (ICD10-170.0) and Emphysema (ICD10-J43.9)   Allergies:  Allergies  Allergen Reactions   Iodine-131 Anaphylaxis   Ivp Dye [Iodinated Contrast Media] Shortness Of Breath and Rash   Other Nausea And Vomiting, Rash, Shortness Of Breath and Other (See Comments)    Other reaction(s): Respiratory Distress (ALLERGY/intolerance)   Morphine Itching   Pneumococcal Vaccine    Codeine Itching, Nausea And Vomiting and Other (See Comments)   Influenza Vaccine Live Rash   Morphine And Codeine Rash  Pneumococcal Vaccines Rash   Shellfish Allergy Nausea And Vomiting    Current Medications: Current Outpatient Medications  Medication Sig Dispense Refill   acetaminophen  (TYLENOL ) 500 MG tablet Take 1,000 mg by mouth every 4 (four) hours as needed for moderate pain.      ascorbic acid (VITAMIN C) 500 MG tablet Take 500 mg by mouth 2 (two) times daily. (0900 & 2100)     aspirin  81 MG chewable tablet Chew 81 mg by mouth daily at 6 PM. (1700)     atorvastatin  (LIPITOR ) 80 MG tablet Take 1 tablet (80 mg total) by mouth daily at 6 PM. (Patient taking differently: Take 80 mg by mouth at bedtime. (2000)) 30 tablet 0   B Complex-C (B-COMPLEX WITH VITAMIN C) tablet Take 1 tablet by mouth daily.     cholecalciferol (VITAMIN D) 25 MCG (1000 UNIT) tablet Take 2,000 Units by mouth daily.     cyanocobalamin  (VITAMIN B12) 1000 MCG/ML injection  Inject 1,000 mcg into the muscle every 30 (thirty) days.     FIBER ADULT GUMMIES PO Take 5 mg by mouth in the morning.     loperamide  (IMODIUM ) 2 MG capsule Take 1 capsule (2 mg total) by mouth as needed for diarrhea or loose stools. 120 capsule 0   ramipril (ALTACE) 10 MG capsule Take 1 capsule by mouth daily.     sertraline (ZOLOFT) 100 MG tablet Take 1 tablet by mouth daily.     No current facility-administered medications for this visit.     ASSESSMENT & PLAN:  Assessment:   1. Clinical stage IIA rectal cancer treated with neoadjuvant chemoradiation with excellent response.  She received adjuvant FOLFOX chemotherapy for 5 cycles.  We feel she has a good prognosis and is over 4 years out from diagnosis. She had her repeat colonoscopy with Dr. Larene in March, 2023, which was negative.  2. History of tobacco abuse, but is still smoking some now.  3. History of large stroke with persistent left hemiparesis.  She will likely need to have carotid endarterectomy on the left side also eventually.   4. Colorectal cancer at early age.  Myriad myRisk Hereditary Cancer Panel did not reveal any clinically significant mutation or variant of uncertain significance.  5. B12 deficiency. Her B12 level was quite low at 156 despite oral supplement, so I do consider her to have pernicious anemia and explained that she will need to stay on lifelong B12 injections.   Plan: Nadalee is here for follow up for her stage IIA adenocarcinoma of the rectum. She was diagnosed in January, 2020 and treated with neoadjuvant chemoradiation followed by surgery and later reversal of her ostomy. Patient states that she feels good and has no complaints of pain. She has lost a significant amount of weight, but she states that she has a great appetite. She continues monthly B12 injections without difficulty for her pernicious anemia. She has a WBC of 4.6, an elevated hemoglobin of 15.7 up from 14.3, and a platelet count of  236,000. Her CMP is normal. She had her last colonoscopy with Dr. Larene in 2023. She will visit Dr. Magdaline on 03/08/2024. I will see her back in 1 year with CBC, CMP, and CEA. We will continue monthly B12 injections. She verbalizes understanding of an agreement to the plan today.  She knows to call the office should any new questions or concerns arise.  I provided 12 minutes of face-to-face time during this this encounter and > 50% was spent counseling  as documented under my assessment and plan.   Wanda VEAR Cornish, MD [facility] AT [facility] [address] [place] [address] Dept: [phone] Dept Fax: [phone]   I,Gabe Glace H Mackensie Pilson,acting as a scribe for Wanda VEAR Cornish, MD.,have documented all relevant documentation on the behalf of Wanda VEAR Cornish, MD,as directed by  Wanda VEAR Cornish, MD while in the presence of Wanda VEAR Cornish, MD.

## 2024-02-27 ENCOUNTER — Encounter: Payer: Self-pay | Admitting: Oncology

## 2024-03-09 ENCOUNTER — Inpatient Hospital Stay: Payer: MEDICAID

## 2024-03-09 VITALS — BP 147/81 | HR 96 | Resp 18 | Ht 67.0 in | Wt 117.0 lb

## 2024-03-09 DIAGNOSIS — E538 Deficiency of other specified B group vitamins: Secondary | ICD-10-CM

## 2024-03-09 DIAGNOSIS — C20 Malignant neoplasm of rectum: Secondary | ICD-10-CM | POA: Diagnosis not present

## 2024-03-09 MED ORDER — CYANOCOBALAMIN 1000 MCG/ML IJ SOLN
1000.0000 ug | Freq: Once | INTRAMUSCULAR | Status: AC
  Administered 2024-03-09: 1000 ug via INTRAMUSCULAR
  Filled 2024-03-09: qty 1

## 2024-03-09 NOTE — Patient Instructions (Signed)
 Vitamin B12 Injection What is this medication? Vitamin B12 (VAHY tuh min B12) prevents and treats low vitamin B12 levels in your body. It is used in people who do not get enough vitamin B12 from their diet or when their digestive tract does not absorb enough. Vitamin B12 plays an important role in maintaining the health of your nervous system and red blood cells. This medicine may be used for other purposes; ask your health care provider or pharmacist if you have questions. COMMON BRAND NAME(S): B-12 Compliance Kit, B-12 Injection Kit, Cyomin, Dodex , LA-12, Nutri-Twelve, Physicians EZ Use B-12, Primabalt, Vitamin Deficiency Injectable System - B12 What should I tell my care team before I take this medication? They need to know if you have any of these conditions: Kidney disease Leber's disease Megaloblastic anemia An unusual or allergic reaction to cyanocobalamin , cobalt, other medications, foods, dyes, or preservatives Pregnant or trying to get pregnant Breast-feeding How should I use this medication? This medication is injected into a muscle or deeply under the skin. It is usually given in a clinic or care team's office. However, your care team may teach you how to inject yourself. Follow all instructions. Talk to your care team about the use of this medication in children. Special care may be needed. Overdosage: If you think you have taken too much of this medicine contact a poison control center or emergency room at once. NOTE: This medicine is only for you. Do not share this medicine with others. What if I miss a dose? If you are given your dose at a clinic or care team's office, call to reschedule your appointment. If you give your own injections, and you miss a dose, take it as soon as you can. If it is almost time for your next dose, take only that dose. Do not take double or extra doses. What may interact with this medication? Alcohol Colchicine This list may not describe all possible  interactions. Give your health care provider a list of all the medicines, herbs, non-prescription drugs, or dietary supplements you use. Also tell them if you smoke, drink alcohol, or use illegal drugs. Some items may interact with your medicine. What should I watch for while using this medication? Visit your care team regularly. You may need blood work done while you are taking this medication. You may need to follow a special diet. Talk to your care team. Limit your alcohol intake and avoid smoking to get the best benefit. What side effects may I notice from receiving this medication? Side effects that you should report to your care team as soon as possible: Allergic reactions--skin rash, itching, hives, swelling of the face, lips, tongue, or throat Swelling of the ankles, hands, or feet Trouble breathing Side effects that usually do not require medical attention (report to your care team if they continue or are bothersome): Diarrhea This list may not describe all possible side effects. Call your doctor for medical advice about side effects. You may report side effects to FDA at 1-800-FDA-1088. Where should I keep my medication? Keep out of the reach of children. Store at room temperature between 15 and 30 degrees C (59 and 85 degrees F). Protect from light. Throw away any unused medication after the expiration date. NOTE: This sheet is a summary. It may not cover all possible information. If you have questions about this medicine, talk to your doctor, pharmacist, or health care provider.  2024 Elsevier/Gold Standard (2021-02-06 00:00:00)

## 2024-04-08 ENCOUNTER — Inpatient Hospital Stay: Payer: MEDICAID | Attending: Oncology

## 2024-04-08 VITALS — BP 120/77 | HR 88 | Temp 98.4°F | Resp 18 | Ht 67.0 in

## 2024-04-08 DIAGNOSIS — E538 Deficiency of other specified B group vitamins: Secondary | ICD-10-CM | POA: Diagnosis present

## 2024-04-08 MED ORDER — CYANOCOBALAMIN 1000 MCG/ML IJ SOLN
1000.0000 ug | Freq: Once | INTRAMUSCULAR | Status: AC
  Administered 2024-04-08: 1000 ug via INTRAMUSCULAR
  Filled 2024-04-08: qty 1

## 2024-04-08 NOTE — Patient Instructions (Signed)
 Vitamin B12 Injection What is this medication? Vitamin B12 (VAHY tuh min B12) prevents and treats low vitamin B12 levels in your body. It is used in people who do not get enough vitamin B12 from their diet or when their digestive tract does not absorb enough. Vitamin B12 plays an important role in maintaining the health of your nervous system and red blood cells. This medicine may be used for other purposes; ask your health care provider or pharmacist if you have questions. COMMON BRAND NAME(S): B-12 Compliance Kit, B-12 Injection Kit, Cyomin, Dodex , LA-12, Nutri-Twelve, Physicians EZ Use B-12, Primabalt, Vitamin Deficiency Injectable System - B12 What should I tell my care team before I take this medication? They need to know if you have any of these conditions: Kidney disease Leber's disease Megaloblastic anemia An unusual or allergic reaction to cyanocobalamin , cobalt, other medications, foods, dyes, or preservatives Pregnant or trying to get pregnant Breast-feeding How should I use this medication? This medication is injected into a muscle or deeply under the skin. It is usually given in a clinic or care team's office. However, your care team may teach you how to inject yourself. Follow all instructions. Talk to your care team about the use of this medication in children. Special care may be needed. Overdosage: If you think you have taken too much of this medicine contact a poison control center or emergency room at once. NOTE: This medicine is only for you. Do not share this medicine with others. What if I miss a dose? If you are given your dose at a clinic or care team's office, call to reschedule your appointment. If you give your own injections, and you miss a dose, take it as soon as you can. If it is almost time for your next dose, take only that dose. Do not take double or extra doses. What may interact with this medication? Alcohol Colchicine This list may not describe all possible  interactions. Give your health care provider a list of all the medicines, herbs, non-prescription drugs, or dietary supplements you use. Also tell them if you smoke, drink alcohol, or use illegal drugs. Some items may interact with your medicine. What should I watch for while using this medication? Visit your care team regularly. You may need blood work done while you are taking this medication. You may need to follow a special diet. Talk to your care team. Limit your alcohol intake and avoid smoking to get the best benefit. What side effects may I notice from receiving this medication? Side effects that you should report to your care team as soon as possible: Allergic reactions--skin rash, itching, hives, swelling of the face, lips, tongue, or throat Swelling of the ankles, hands, or feet Trouble breathing Side effects that usually do not require medical attention (report to your care team if they continue or are bothersome): Diarrhea This list may not describe all possible side effects. Call your doctor for medical advice about side effects. You may report side effects to FDA at 1-800-FDA-1088. Where should I keep my medication? Keep out of the reach of children. Store at room temperature between 15 and 30 degrees C (59 and 85 degrees F). Protect from light. Throw away any unused medication after the expiration date. NOTE: This sheet is a summary. It may not cover all possible information. If you have questions about this medicine, talk to your doctor, pharmacist, or health care provider.  2024 Elsevier/Gold Standard (2021-02-06 00:00:00)

## 2024-05-11 ENCOUNTER — Inpatient Hospital Stay: Payer: MEDICAID | Attending: Oncology

## 2024-05-11 VITALS — BP 114/75 | HR 85 | Temp 98.1°F | Resp 20 | Ht 67.0 in | Wt 115.1 lb

## 2024-05-11 DIAGNOSIS — E538 Deficiency of other specified B group vitamins: Secondary | ICD-10-CM | POA: Diagnosis present

## 2024-05-11 MED ORDER — CYANOCOBALAMIN 1000 MCG/ML IJ SOLN
1000.0000 ug | Freq: Once | INTRAMUSCULAR | Status: AC
  Administered 2024-05-11: 1000 ug via INTRAMUSCULAR
  Filled 2024-05-11: qty 1

## 2024-05-11 NOTE — Patient Instructions (Signed)
 B 12

## 2024-06-08 ENCOUNTER — Inpatient Hospital Stay: Payer: MEDICAID

## 2024-06-08 VITALS — BP 109/79 | HR 99 | Temp 97.6°F | Resp 18 | Ht 67.0 in

## 2024-06-08 DIAGNOSIS — E538 Deficiency of other specified B group vitamins: Secondary | ICD-10-CM | POA: Diagnosis not present

## 2024-06-08 MED ORDER — CYANOCOBALAMIN 1000 MCG/ML IJ SOLN
1000.0000 ug | Freq: Once | INTRAMUSCULAR | Status: AC
  Administered 2024-06-08: 1000 ug via INTRAMUSCULAR
  Filled 2024-06-08: qty 1

## 2024-06-08 NOTE — Patient Instructions (Signed)
 Vitamin B12 Injection What is this medication? Vitamin B12 (VAHY tuh min B12) prevents and treats low vitamin B12 levels in your body. It is used in people who do not get enough vitamin B12 from their diet or when their digestive tract does not absorb enough. Vitamin B12 plays an important role in maintaining the health of your nervous system and red blood cells. This medicine may be used for other purposes; ask your health care provider or pharmacist if you have questions. COMMON BRAND NAME(S): B-12 Compliance Kit, B-12 Injection Kit, Cyomin, Dodex , LA-12, Nutri-Twelve, Physicians EZ Use B-12, Primabalt, Vitamin Deficiency Injectable System - B12 What should I tell my care team before I take this medication? They need to know if you have any of these conditions: Kidney disease Leber's disease Megaloblastic anemia An unusual or allergic reaction to cyanocobalamin , cobalt, other medications, foods, dyes, or preservatives Pregnant or trying to get pregnant Breast-feeding How should I use this medication? This medication is injected into a muscle or deeply under the skin. It is usually given in a clinic or care team's office. However, your care team may teach you how to inject yourself. Follow all instructions. Talk to your care team about the use of this medication in children. Special care may be needed. Overdosage: If you think you have taken too much of this medicine contact a poison control center or emergency room at once. NOTE: This medicine is only for you. Do not share this medicine with others. What if I miss a dose? If you are given your dose at a clinic or care team's office, call to reschedule your appointment. If you give your own injections, and you miss a dose, take it as soon as you can. If it is almost time for your next dose, take only that dose. Do not take double or extra doses. What may interact with this medication? Alcohol Colchicine This list may not describe all possible  interactions. Give your health care provider a list of all the medicines, herbs, non-prescription drugs, or dietary supplements you use. Also tell them if you smoke, drink alcohol, or use illegal drugs. Some items may interact with your medicine. What should I watch for while using this medication? Visit your care team regularly. You may need blood work done while you are taking this medication. You may need to follow a special diet. Talk to your care team. Limit your alcohol intake and avoid smoking to get the best benefit. What side effects may I notice from receiving this medication? Side effects that you should report to your care team as soon as possible: Allergic reactions--skin rash, itching, hives, swelling of the face, lips, tongue, or throat Swelling of the ankles, hands, or feet Trouble breathing Side effects that usually do not require medical attention (report to your care team if they continue or are bothersome): Diarrhea This list may not describe all possible side effects. Call your doctor for medical advice about side effects. You may report side effects to FDA at 1-800-FDA-1088. Where should I keep my medication? Keep out of the reach of children. Store at room temperature between 15 and 30 degrees C (59 and 85 degrees F). Protect from light. Throw away any unused medication after the expiration date. NOTE: This sheet is a summary. It may not cover all possible information. If you have questions about this medicine, talk to your doctor, pharmacist, or health care provider.  2024 Elsevier/Gold Standard (2021-02-06 00:00:00)

## 2024-07-09 ENCOUNTER — Inpatient Hospital Stay: Payer: MEDICAID

## 2024-07-16 ENCOUNTER — Inpatient Hospital Stay: Payer: MEDICAID | Attending: Oncology

## 2024-07-22 ENCOUNTER — Inpatient Hospital Stay: Payer: MEDICAID | Attending: Oncology

## 2024-08-06 ENCOUNTER — Inpatient Hospital Stay: Payer: MEDICAID

## 2024-08-13 ENCOUNTER — Inpatient Hospital Stay: Payer: MEDICAID

## 2024-08-19 ENCOUNTER — Inpatient Hospital Stay: Payer: MEDICAID

## 2024-09-03 ENCOUNTER — Inpatient Hospital Stay: Payer: MEDICAID

## 2024-09-10 ENCOUNTER — Inpatient Hospital Stay: Payer: MEDICAID

## 2024-09-16 ENCOUNTER — Inpatient Hospital Stay: Payer: MEDICAID

## 2024-10-01 ENCOUNTER — Inpatient Hospital Stay: Payer: MEDICAID

## 2024-10-08 ENCOUNTER — Inpatient Hospital Stay: Payer: MEDICAID

## 2024-10-14 ENCOUNTER — Inpatient Hospital Stay: Payer: MEDICAID

## 2025-02-18 ENCOUNTER — Ambulatory Visit: Payer: MEDICAID | Admitting: Oncology

## 2025-02-18 ENCOUNTER — Other Ambulatory Visit: Payer: MEDICAID
